# Patient Record
Sex: Female | Born: 1945
Health system: Southern US, Community
[De-identification: ages and names within clinical notes are randomized; demographics above are authoritative.]

## PROBLEM LIST (undated history)

## (undated) DIAGNOSIS — E785 Hyperlipidemia, unspecified: Secondary | ICD-10-CM

## (undated) DIAGNOSIS — M810 Age-related osteoporosis without current pathological fracture: Secondary | ICD-10-CM

## (undated) DIAGNOSIS — K5792 Diverticulitis of intestine, part unspecified, without perforation or abscess without bleeding: Secondary | ICD-10-CM

## (undated) DIAGNOSIS — Z9889 Other specified postprocedural states: Secondary | ICD-10-CM

## (undated) DIAGNOSIS — J45909 Unspecified asthma, uncomplicated: Secondary | ICD-10-CM

## (undated) DIAGNOSIS — H269 Unspecified cataract: Secondary | ICD-10-CM

## (undated) DIAGNOSIS — C801 Malignant (primary) neoplasm, unspecified: Secondary | ICD-10-CM

## (undated) DIAGNOSIS — K5790 Diverticulosis of intestine, part unspecified, without perforation or abscess without bleeding: Secondary | ICD-10-CM

## (undated) DIAGNOSIS — M5090 Cervical disc disorder, unspecified, unspecified cervical region: Secondary | ICD-10-CM

## (undated) DIAGNOSIS — K219 Gastro-esophageal reflux disease without esophagitis: Secondary | ICD-10-CM

## (undated) DIAGNOSIS — R112 Nausea with vomiting, unspecified: Secondary | ICD-10-CM

## (undated) DIAGNOSIS — I10 Essential (primary) hypertension: Secondary | ICD-10-CM

## (undated) DIAGNOSIS — Z8719 Personal history of other diseases of the digestive system: Secondary | ICD-10-CM

## (undated) DIAGNOSIS — T7840XA Allergy, unspecified, initial encounter: Secondary | ICD-10-CM

## (undated) DIAGNOSIS — M8580 Other specified disorders of bone density and structure, unspecified site: Secondary | ICD-10-CM

## (undated) DIAGNOSIS — M1990 Unspecified osteoarthritis, unspecified site: Secondary | ICD-10-CM

## (undated) HISTORY — DX: Unspecified cataract: H26.9

## (undated) HISTORY — DX: Nausea with vomiting, unspecified: R11.2

## (undated) HISTORY — DX: Diverticulosis of intestine, part unspecified, without perforation or abscess without bleeding: K57.90

## (undated) HISTORY — PX: KNEE ARTHROSCOPY: SUR90

## (undated) HISTORY — DX: Allergy, unspecified, initial encounter: T78.40XA

## (undated) HISTORY — PX: ESOPHAGEAL DILATION: SHX303

## (undated) HISTORY — PX: OTHER SURGICAL HISTORY: SHX169

## (undated) HISTORY — DX: Unspecified osteoarthritis, unspecified site: M19.90

## (undated) HISTORY — DX: Hyperlipidemia, unspecified: E78.5

## (undated) HISTORY — DX: Unspecified asthma, uncomplicated: J45.909

## (undated) HISTORY — PX: CHOLECYSTECTOMY: SHX55

## (undated) HISTORY — DX: Gastro-esophageal reflux disease without esophagitis: K21.9

## (undated) HISTORY — DX: Other specified postprocedural states: Z98.890

## (undated) HISTORY — DX: Cervical disc disorder, unspecified, unspecified cervical region: M50.90

## (undated) HISTORY — PX: JOINT REPLACEMENT: SHX530

## (undated) HISTORY — PX: UPPER GASTROINTESTINAL ENDOSCOPY: SHX188

## (undated) HISTORY — DX: Essential (primary) hypertension: I10

## (undated) HISTORY — PX: BLADDER SUSPENSION: SHX72

## (undated) HISTORY — DX: Personal history of other diseases of the digestive system: Z87.19

## (undated) HISTORY — PX: CERVICAL FUSION: SHX112

## (undated) HISTORY — DX: Other specified disorders of bone density and structure, unspecified site: M85.80

## (undated) HISTORY — PX: SPINE SURGERY: SHX786

## (undated) HISTORY — PX: FRACTURE SURGERY: SHX138

## (undated) HISTORY — DX: Diverticulitis of intestine, part unspecified, without perforation or abscess without bleeding: K57.92

## (undated) HISTORY — DX: Personal history of other diseases of the digestive system: Z98.890

## (undated) HISTORY — PX: APPENDECTOMY: SHX54

## (undated) HISTORY — DX: Malignant (primary) neoplasm, unspecified: C80.1

---

## 1993-12-16 HISTORY — PX: ABDOMINAL HYSTERECTOMY: SHX81

## 1999-11-27 ENCOUNTER — Other Ambulatory Visit: Admission: RE | Admit: 1999-11-27 | Discharge: 1999-11-27 | Payer: Self-pay | Admitting: Gynecology

## 2000-08-07 ENCOUNTER — Other Ambulatory Visit: Admission: RE | Admit: 2000-08-07 | Discharge: 2000-08-07 | Payer: Self-pay | Admitting: Gynecology

## 2001-01-16 ENCOUNTER — Encounter (INDEPENDENT_AMBULATORY_CARE_PROVIDER_SITE_OTHER): Payer: Self-pay | Admitting: Specialist

## 2001-01-16 ENCOUNTER — Observation Stay (HOSPITAL_COMMUNITY): Admission: RE | Admit: 2001-01-16 | Discharge: 2001-01-17 | Payer: Self-pay

## 2002-02-23 ENCOUNTER — Other Ambulatory Visit: Admission: RE | Admit: 2002-02-23 | Discharge: 2002-02-23 | Payer: Self-pay | Admitting: Gynecology

## 2004-02-15 ENCOUNTER — Encounter: Admission: RE | Admit: 2004-02-15 | Discharge: 2004-02-15 | Payer: Self-pay | Admitting: Obstetrics and Gynecology

## 2004-03-23 ENCOUNTER — Ambulatory Visit (HOSPITAL_BASED_OUTPATIENT_CLINIC_OR_DEPARTMENT_OTHER): Admission: RE | Admit: 2004-03-23 | Discharge: 2004-03-23 | Payer: Self-pay | Admitting: Orthopedic Surgery

## 2004-03-23 ENCOUNTER — Ambulatory Visit (HOSPITAL_COMMUNITY): Admission: RE | Admit: 2004-03-23 | Discharge: 2004-03-23 | Payer: Self-pay | Admitting: Orthopedic Surgery

## 2004-04-10 ENCOUNTER — Other Ambulatory Visit: Admission: RE | Admit: 2004-04-10 | Discharge: 2004-04-10 | Payer: Self-pay | Admitting: Obstetrics and Gynecology

## 2004-12-06 ENCOUNTER — Ambulatory Visit: Payer: Self-pay | Admitting: Family Medicine

## 2005-05-03 ENCOUNTER — Encounter: Admission: RE | Admit: 2005-05-03 | Discharge: 2005-05-03 | Payer: Self-pay | Admitting: Obstetrics and Gynecology

## 2005-05-22 ENCOUNTER — Ambulatory Visit: Payer: Self-pay | Admitting: Family Medicine

## 2005-09-04 ENCOUNTER — Ambulatory Visit: Payer: Self-pay | Admitting: Gastroenterology

## 2005-10-23 ENCOUNTER — Encounter: Admission: RE | Admit: 2005-10-23 | Discharge: 2005-10-23 | Payer: Self-pay | Admitting: Family Medicine

## 2005-12-16 HISTORY — PX: ABDOMINAL SACROCOLPOPEXY: SHX1114

## 2006-02-06 ENCOUNTER — Ambulatory Visit: Payer: Self-pay | Admitting: Gastroenterology

## 2006-02-13 ENCOUNTER — Ambulatory Visit: Payer: Self-pay | Admitting: Gastroenterology

## 2006-05-05 ENCOUNTER — Encounter: Admission: RE | Admit: 2006-05-05 | Discharge: 2006-05-05 | Payer: Self-pay | Admitting: Obstetrics and Gynecology

## 2006-09-10 ENCOUNTER — Ambulatory Visit: Payer: Self-pay | Admitting: Gastroenterology

## 2006-10-23 ENCOUNTER — Ambulatory Visit: Payer: Self-pay | Admitting: Gynecology

## 2006-10-23 ENCOUNTER — Inpatient Hospital Stay (HOSPITAL_COMMUNITY): Admission: RE | Admit: 2006-10-23 | Discharge: 2006-10-26 | Payer: Self-pay | Admitting: Obstetrics and Gynecology

## 2007-05-07 ENCOUNTER — Encounter: Admission: RE | Admit: 2007-05-07 | Discharge: 2007-05-07 | Payer: Self-pay | Admitting: Obstetrics and Gynecology

## 2007-07-28 ENCOUNTER — Encounter: Admission: RE | Admit: 2007-07-28 | Discharge: 2007-07-28 | Payer: Self-pay | Admitting: Obstetrics and Gynecology

## 2008-03-09 ENCOUNTER — Ambulatory Visit: Payer: Self-pay | Admitting: Cardiology

## 2008-03-15 ENCOUNTER — Ambulatory Visit: Payer: Self-pay

## 2008-05-10 ENCOUNTER — Encounter: Admission: RE | Admit: 2008-05-10 | Discharge: 2008-05-10 | Payer: Self-pay | Admitting: Obstetrics and Gynecology

## 2009-01-30 ENCOUNTER — Ambulatory Visit (HOSPITAL_COMMUNITY): Admission: EM | Admit: 2009-01-30 | Discharge: 2009-02-01 | Payer: Self-pay | Admitting: Emergency Medicine

## 2009-01-30 ENCOUNTER — Encounter (INDEPENDENT_AMBULATORY_CARE_PROVIDER_SITE_OTHER): Payer: Self-pay | Admitting: Surgery

## 2009-01-30 ENCOUNTER — Encounter: Admission: RE | Admit: 2009-01-30 | Discharge: 2009-01-30 | Payer: Self-pay | Admitting: Family Medicine

## 2009-05-26 ENCOUNTER — Encounter: Admission: RE | Admit: 2009-05-26 | Discharge: 2009-05-26 | Payer: Self-pay | Admitting: Obstetrics and Gynecology

## 2009-12-16 HISTORY — PX: OTHER SURGICAL HISTORY: SHX169

## 2009-12-16 HISTORY — PX: RECTOCELE REPAIR: SHX761

## 2010-03-13 ENCOUNTER — Ambulatory Visit (HOSPITAL_COMMUNITY): Admission: RE | Admit: 2010-03-13 | Discharge: 2010-03-14 | Payer: Self-pay | Admitting: Obstetrics and Gynecology

## 2010-06-04 ENCOUNTER — Encounter: Admission: RE | Admit: 2010-06-04 | Discharge: 2010-06-04 | Payer: Self-pay | Admitting: Obstetrics and Gynecology

## 2011-03-10 LAB — COMPREHENSIVE METABOLIC PANEL
ALT: 23 U/L (ref 0–35)
AST: 22 U/L (ref 0–37)
Albumin: 3.9 g/dL (ref 3.5–5.2)
Alkaline Phosphatase: 56 U/L (ref 39–117)
BUN: 12 mg/dL (ref 6–23)
CO2: 28 mEq/L (ref 19–32)
Calcium: 9.4 mg/dL (ref 8.4–10.5)
Chloride: 105 mEq/L (ref 96–112)
Creatinine, Ser: 0.98 mg/dL (ref 0.4–1.2)
GFR calc Af Amer: >60 mL/min (ref >60)
GFR calc non Af Amer: 57 mL/min — ABNORMAL LOW (ref >60)
Glucose, Bld: 86 mg/dL (ref 70–99)
Potassium: 3.9 mEq/L (ref 3.5–5.1)
Sodium: 143 mEq/L (ref 135–145)
Total Bilirubin: 0.5 mg/dL (ref 0.3–1.2)
Total Protein: 6.6 g/dL (ref 6.0–8.3)

## 2011-03-10 LAB — CBC
HCT: 30.5 % — ABNORMAL LOW (ref 36.0–46.0)
HCT: 37.1 % (ref 36.0–46.0)
Hemoglobin: 10.7 g/dL — ABNORMAL LOW (ref 12.0–15.0)
Hemoglobin: 12.8 g/dL (ref 12.0–15.0)
MCHC: 34.5 g/dL (ref 30.0–36.0)
MCHC: 35.2 g/dL (ref 30.0–36.0)
MCV: 90 fL (ref 78.0–100.0)
MCV: 90.6 fL (ref 78.0–100.0)
Platelets: 142 10*3/uL — ABNORMAL LOW (ref 150–400)
Platelets: 184 10*3/uL (ref 150–400)
RBC: 3.37 MIL/uL — ABNORMAL LOW (ref 3.87–5.11)
RBC: 4.12 MIL/uL (ref 3.87–5.11)
RDW: 12.9 % (ref 11.5–15.5)
RDW: 13.1 % (ref 11.5–15.5)
WBC: 4.1 10*3/uL (ref 4.0–10.5)
WBC: 6.6 10*3/uL (ref 4.0–10.5)

## 2011-03-10 LAB — URINALYSIS, ROUTINE W REFLEX MICROSCOPIC
Bilirubin Urine: NEGATIVE
Glucose, UA: NEGATIVE mg/dL
Hgb urine dipstick: NEGATIVE
Ketones, ur: NEGATIVE mg/dL
Nitrite: NEGATIVE
Protein, ur: NEGATIVE mg/dL
Specific Gravity, Urine: <1.005 — ABNORMAL LOW (ref 1.005–1.030)
Urobilinogen, UA: 0.2 mg/dL (ref 0.0–1.0)
pH: 6 (ref 5.0–8.0)

## 2011-03-10 LAB — PROTIME-INR
INR: 0.96 (ref 0.00–1.49)
Prothrombin Time: 12.7 seconds (ref 11.6–15.2)

## 2011-03-10 LAB — APTT: aPTT: 27 seconds (ref 24–37)

## 2011-04-02 LAB — DIFFERENTIAL
Basophils Absolute: 0 10*3/uL (ref 0.0–0.1)
Basophils Relative: 0 % (ref 0–1)
Eosinophils Absolute: 0 10*3/uL (ref 0.0–0.7)
Eosinophils Relative: 0 % (ref 0–5)
Lymphocytes Relative: 5 % — ABNORMAL LOW (ref 12–46)
Lymphs Abs: 0.4 10*3/uL — ABNORMAL LOW (ref 0.7–4.0)
Monocytes Absolute: 0.1 10*3/uL (ref 0.1–1.0)
Monocytes Relative: 1 % — ABNORMAL LOW (ref 3–12)
Neutro Abs: 6.1 10*3/uL (ref 1.7–7.7)
Neutrophils Relative %: 94 % — ABNORMAL HIGH (ref 43–77)

## 2011-04-02 LAB — CBC
HCT: 35.9 % — ABNORMAL LOW (ref 36.0–46.0)
Hemoglobin: 12.1 g/dL (ref 12.0–15.0)
MCHC: 33.7 g/dL (ref 30.0–36.0)
MCV: 88 fL (ref 78.0–100.0)
Platelets: 178 10*3/uL (ref 150–400)
RBC: 4.07 MIL/uL (ref 3.87–5.11)
RDW: 13.5 % (ref 11.5–15.5)
WBC: 6.6 10*3/uL (ref 4.0–10.5)

## 2011-04-30 NOTE — Assessment & Plan Note (Signed)
[facility]                            CARDIOLOGY OFFICE NOTE   Sarah [name], Sarah [name]                       MRN:          [id number]  DATE:03/09/2008                            DOB:          [birth year]    The patient is a very pleasant 65 year old female who I am asked to  evaluate for chest pain.  She has no prior cardiac history.  She  typically does not have dyspnea on exertion, orthopnea, PND, pedal  edema, palpitations, presyncope, syncope, chest pain.  Approximately 1  month ago she awoke with a tightness diffusely across her chest that  radiates to her neck.  There is no associated nausea, vomiting,  shortness of breath or diaphoresis.  Pain was not pleuritic positional  nor is related to food.  It lasted for the 20 minutes 1 hour resolve  spontaneously.  Of note she states he fell as she may have been had an  anxiety attack.  She had second episode approximately 1 week ago.  That  time she was watching TV and was described similarly.  Again there was  no nausea, vomiting, shortness breath or diaphoresis.  It lasted for the  same amount of time.  She also states that she has been under  significant amount of stress at work recently.  Because of the above we  were asked to further evaluate.  She has had no symptoms in the past 1  week.  Her medications include Zocor 40 mg daily, Diovan 80 mg daily,  Nexium 40 mg daily, aspirin 81 mg daily, and estrogen patch.  She takes  Zyrtec p.r.n.  She has no known drug allergies.   SOCIAL HISTORY:  She does not smoke.  She consumes one glass of wine per  evening.   FAMILY HISTORY:  Strongly positive coronary artery disease.  Father had  myocardial infarctions in his 7s and sister has had a myocardial  infarction as well.   PAST MEDICAL HISTORY:  Significant for hypertension, hyperlipidemia.  There is no diabetes mellitus.  She does have a history of asthma.  She  also has gastroesophageal disease.  She  has had prior surgery for  cervical disk problem.  She has had previous left shoulder arthroscopic  surgery.  She has had cholecystectomy, hysterectomy, and reconstruction  of her pelvic structures.  She is also had three arthroscopic knee  surgeries.   REVIEW OF SYSTEMS:  She denies any headaches or fevers, chills.  No  productive cough or hemoptysis.  There is no dysphagia.  The patient  melena or hematochezia.  There is no dysuria inch of his or seizure  activity.  There is no orthopnea, PND, pedal edema.  There is no  claudication noted.  Remaining systems are negative.   PHYSICAL EXAMINATION:  She is well-developed, well-nourished in no acute  distress.  Skin is warm, dry.  The patient is not depressed.  There is no peripheral clubbing.  Her back is normal.  Blood pressure is 145/79 and pulse 56.  She weighs 167 pounds.  Her HEENT is normal with normal eyelids.  Neck is supple.  Normal upstroke bilaterally.  No bruits noted.  There  is no jugular distention and no thyromegaly is noted.  Chest is clear.  Normal expansion.  CARDIOVASCULAR:  Regular rhythm.  Normal S1-S2.  There are no murmurs,  rubs or gallops noted.  There is no change with Valsalva.  ABDOMEN:  Nontender.  Positive bowel sounds hepatosplenomegaly no mass  appreciated.  There is no abdominal bruit.  She has 2+ femoral pulses bilaterally bruits.  Extremities show no edema  palpate no cords.  She is 2+ posterior tibial pulses bilaterally.  NEUROLOGIC:  Grossly intact.   Her electrocardiogram shows a sinus rhythm at a rate of 56.  The axis is  normal.  No ST changes noted.   DIAGNOSES:  1. Chest pain - Mrs. Tovey's symptoms are atypical.  She does state      that she had been under significant amount stress.      Electrocardiogram is normal.  However, she has hypertension,      hyperlipidemia, strong family history of coronary disease.  We will      risk stratify with a Myoview.  Shows no ischemia,  we will  not      pursue further ischemia evaluation unless her symptoms recur.  We      will see back on as-needed basis pending those results.  2. Hypertension - she will continue on her Diovan and this is being      managed by Dr. Herb Grays.  3. Hyperlipidemia - she will continue on Zocor.  4. History of gastric reflux disease - she will continue on Nexium.     Madolyn Frieze Jens Som, MD, [facility]  Electronically Signed    BSC/MedQ  DD: 03/09/2008  DT: 03/09/2008  Job #: [id number]   cc:   Tammy R. Collins [place], M.D.

## 2011-04-30 NOTE — Op Note (Signed)
NAMEMarland Kitchen  Sarah, [name]                ACCOUNT NO.:  [id number]   MEDICAL RECORD NO.:  [id number]          PATIENT TYPE:  INP   LOCATION:  0111                         FACILITY:  [facility]   PHYSICIAN:  Thornton Park. Daphine Deutscher, MD  DATE OF BIRTH:  Feb 27, 1946   DATE OF PROCEDURE:  01/30/2009  DATE OF DISCHARGE:                               OPERATIVE REPORT   PREOPERATIVE DIAGNOSIS:  Acute appendicitis.   POSTOPERATIVE DIAGNOSIS:  Acute pelvic appendicitis with appendix stuck  to the pelvic mesh.   PROCEDURE:  Laparoscopic appendectomy.   SURGEON:  Luretha Murphy, M.D.   ANESTHESIA:  General endotracheal.   DRAINS:  One 45 Blake into the pelvis.   DESCRIPTION OF PROCEDURE:  Sarah [name] is a 65 year old lady whom I saw  in the emergency room having been referred there by Dr. Collins [name] with  appendicitis.  I reviewed her CT scan and posted her.  Her history was  remarkable in that she has had previous pelvic floor reconstruction with  mesh.   I elected to go in through the left upper quadrant with 10 mm 10/11  OptiView 0 degrees scope without difficulty.  The abdomen was  insufflated.  A 5 was placed the right upper quadrant.  She had a  hormone patch that had been prepped in the field and was left in place.  The oblique placement of the 11/12 in the left lower quadrant was where  we ran the camera initially but where I needed to fire the stapler.  I  took down adhesions with the Harmonic through the umbilicus as she had  many from her previous pelvic surgery.  These were taken down and I  followed the cecum down and found this hard woody mass diving straight  down into the pelvis.  There was yellow-green fluid which I aspirated  with the suction apparatus and cleared that.  I then did a dissection  mainly with the suction tip and a Prestige grasper to then followed down  to the tip.  When I discerned the tip, it was fused in the pelvis.  I  began teasing this away and realized that what I was  seeing was where it  was stuck to mesh.  I teased this away from the mesh and was able to get  this out.  With some tedious dissection we got it out nevertheless and  again blunt dissection.  I did not appear to damage any structures and  once the tip was mobilized, I then began looking at the mesentery.  This  was very thick and I realized that with the mesentery which was stuck to  the tube structure on the right and there were some fimbriated ends that  helped me identify the structure and I teased it away from the  mesentery.  Again I was using the 30 degree scope at this point and went  in through the lower portion of the abdomen and dissected out the base  of the appendix and was able to insert the stapler from that end and  fired this and this gave me a good  clean take on the appendiceal stump.  The appendiceal stump appeared secure.  I then resected the appendix  mesentery with Harmonic scalpel.  There was no bleeding when I completed  this.  I placed it into a bag and then brought it out without difficulty  through the left lower quadrant.  She did have some trouble with the  trocars that were going in and out and I did place them obliquely.  After there was a little oozing from the left lower quadrant trocar and  I went and cauterized over the harmonic scalpel.  This controlled this  completely and this was just a minor ooze and did not appear to be any  arterial bleeding.  I then placed a drain in and brought it out through  the 5 mm in the right upper quadrant.  I sutured it with a 4-0 nylon.  This was placed down into the pelvis on the right side and laid in next  to where the appendiceal bed had been.  I irrigated with copious fluid  using the Nezhat  suction irrigator device.  I then returned her to a normal position,  deflated the abdomen, closed skin with 4-0 Vicryl after injecting with  some Marcaine.  The patient seemed to tolerate the procedure well and  was taken to  recovery room in satisfactory condition.  She will be  maintained on Unasyn which I started preoperatively.      Thornton Park Daphine Deutscher, MD  Electronically Signed     MBM/MEDQ  D:  01/30/2009  T:  01/31/2009  Job:  [id number]   cc:   Tammy R. Sarah [name], M.D.  Fax: [phone]   Randye Lobo, M.D.  Fax: [phone]

## 2011-05-03 NOTE — Discharge Summary (Signed)
NAMEMarland Kitchen  Sarah [name], Sarah [name] NO.:  [id number]   MEDICAL RECORD NO.:  [id number]          PATIENT TYPE:  INP   LOCATION:  9319                          FACILITY:  [facility]   PHYSICIAN:  Randye Lobo, M.D.   DATE OF BIRTH:  1946/05/31   DATE OF ADMISSION:  10/23/2006  DATE OF DISCHARGE:  10/26/2006                               DISCHARGE SUMMARY   ADMISSION DIAGNOSIS:  Complete vaginal vault prolapse.   DISCHARGE DIAGNOSES:  1. Complete vaginal vault prolapse.  2. Status post abdominosacrocolpopexy.  3. Abdominal Burch procedure, Halban's culdoplasty, Cystoscopy.  4. Postoperative fever attributed to atelectasis.   SIGNIFICANT OPERATIONS AND PROCEDURES:  The patient underwent an  abdominosacrocolpopexy, Halban's culdoplasty, abdominal Burch procedure,  and cystoscopy on October 23, 2006, at [facility] of  [place] under the direction of Dr. Conley Simmonds and with the  assistance of Dr. Alfonzo Feller.   ADMISSION HISTORY AND PHYSICAL EXAMINATION:  GENERAL:  The patient is a  65 year old gravida 1, para 1, Caucasian female, status post total  vaginal hysterectomy with anterior and posterior colporrhaphy in 1995,  who presented with vaginal prolapse and leakage of urine with stressful  maneuvers.  The patient desired surgical treatment of the above.  On  physical examination the patient was noted to have complete vaginal  vault eversion.  The cervix and uterus were absent, and there were no  pelvic masses appreciated.   The patient was admitted on October 23, 2006, at which time she  underwent an abdominosacrocolpopexy, Halban's culdoplasty, abdominal  Burch procedure and cystoscopy under the direction of Dr. Conley Simmonds  and with the assistance of Dr. Alfonzo Feller.  The patient's surgery  was performed under general anesthesia, and her estimated blood loss was  150 mL.  There were no complications to the surgery.   Following surgery, the patient had  decreased urine output with stable  vital signs.  The patient was given IV fluid boluses to treat her  dehydration.  The patient's urine output then responded well.   By the afternoon of postoperative day 1, the patient did begin with some  bladder training and had large postvoid residuals, and the Foley  catheter was therefore replaced.   The patient began developing fevers approximately 30 hours postop.  Her  temperature reached 101.4.  On examination, the patient was noted to  have atelectasis and was using her incentive spirometry poorly.  A  urinalysis was also checked, and this did not demonstrate any infection.  The patient, at this time, was on Ancef prophylaxis which was chosen  secondary to use of the synthetic graft for the sacral colpopexy.  A CBC  on postoperative day #1 did demonstrate a white blood cell count of 6.0.  By postoperative day #2, the patient's white count was noted to be 5.3  with 70% neutrophils.  The patient did continue to spike her fevers and,  again, although the diagnosis was most consistent with atelectasis,  there was also potential concern for infection with the patient's  synthetic graft for the sacral colpopexy.  The Ancef was  therefore  discontinue, and ampicillin and gentamicin was begun.  By postoperative  day #3, the patient had become afebrile.  Her respiratory status  improved, and she was noted to be in good condition.   With respect to the patient's remainder of her hospitalization, she was  able to ambulate independently and well.  She was advanced to a regular  diet which she was tolerating prior to her discharge.  The patient did  have both TED hose and PAS stockings for DVT prophylaxis during her  hospitalization.  The patient's Foley catheter was simply left to  gravity drainage due to the large residuals that she had.  Her abdominal  incision remained clean, dry, and intact, and the staples were removed  prior to her discharge, and  Steri-Strips and Benzoin were placed.   The patient was found to be in good condition, ready for discharge on  postoperative day #3.  Her white blood cell count on the day of  discharge was 4.5 with 65% neutrophils, and her hemoglobin was 10.0.   INSTRUCTIONS AT DISCHARGE:  1. Discharge to home.  2. Medications at discharge.  The patient will take Percocet 1-2 p.o.      q.4-6h. p.r.n. pain; ibuprofen 600 mg p.o. q.6h. p.r.n. pain.  3. The patient will follow a regular diet.  4. The patient will have decreased activity.  She will not drive for 2      weeks.  She will not lift anything over 10 pounds for 12 weeks.  5. The patient will follow up in 5 days in the office for an incision      check and a voiding trial.  6. The patient will call if she experiences any problems with      recurrent fevers, nausea and vomiting, pain uncontrolled by her      medication, vaginal bleeding, difficulty with her catheter,      respiratory problems, or any other concern.      Randye Lobo, M.D.  Electronically Signed     BES/MEDQ  D:  11/16/2006  T:  11/17/2006  Job:  [id number]

## 2011-05-03 NOTE — Op Note (Signed)
NAME:  Sarah [name], SURGES                          ACCOUNT NO.:  [id number]   MEDICAL RECORD NO.:  [id number]                   PATIENT TYPE:  AMB   LOCATION:  DSC                                  FACILITY:  [facility]   PHYSICIAN:  Katy Fitch. Naaman Plummer., M.D.          DATE OF BIRTH:  [birth year]   DATE OF PROCEDURE:  03/23/2004  DATE OF DISCHARGE:                                 OPERATIVE REPORT   PREOPERATIVE DIAGNOSIS:  Chronic mucous cyst right long finger with nail  plate irregularity status post prior wedge biopsy of right long finger  nail fold.   POSTOPERATIVE DIAGNOSIS:  Chronic mucous cyst right long finger with nail  plate irregularity status post prior wedge biopsy of right long finger  nail fold.   OPERATION:  1. Debridement of mucous cyst right long finger from dorsal nail fold.  2. DIP joint irrigation and debridement with removal of marginal     osteophytes.   SURGEON:  Katy Fitch. Sypher, M.D.   ASSISTANT:  Jonni Sanger, P.A.   ANESTHESIA:  0.25% Marcaine and 2% lidocaine metacarpal head level block  supplemented by IV sedation.   ANESTHESIOLOGIST:  Judie Petit, M.D.   INDICATIONS FOR PROCEDURE:  Sarah [name] is a 65 year old woman referred by  Dr. Marcy Panning for evaluation and management of a right long finger mucous  cyst predicament.  [name] has seen Dr. Orson Slick for evaluation of a mass  along her nail fold and underwent a wedge biopsy for diagnosis by Dr.  Orson Slick.  This lead to the diagnosis of a mucous cyst.  Unfortunately, there  was a significant residual nail deformity due, in part, to the cyst and due  to a loss of the dorsal nail fold.  She, subsequently, was referred for an  upper extremity orthopedic consult.  She was noted to have a 6 mm wide  irregular band of her central nail plate.  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  She understands that it is likely that she will have permanent  nail deformity due to a portion of her dorsal nail fold being resected.   PROCEDURE:  [name] is brought to the operating room and placed on  supine position on the operating table.  Following informed consent, 0.25%  Marcaine and 2% lidocaine were infiltrated at metacarpal head levels to  obtain a digital block.  Light sedation was administered.  When anesthesia  was satisfactory, the finger was exsanguinated with a gauze wrap and a 1/2  inch Penrose drain was placed at metacarpal head level as a digital  tourniquet.  The procedure commenced with a curvilinear incision exposing  the extensor tendon.  A capsulectomy was performed  on the dorsal radial and  dorsal ulnar aspect of the DIP joint with resection of the capsule between  the terminal extensor tendon and the radial and ulnar collateral ligaments.  A small curet was used to debride the marginal osteophytes followed by  irrigation of the joint with sterile saline utilizing a 20 gauge Angiocath  catheter and a 20 mL syringe.  The joint was irrigated with approximately 80  mL of saline.  The cyst along the nail fold was then curetted with a fine  curet deep to the dorsal nail fold and in the subcutaneous region of the  dorsal nail fold in an effort to disrupt any sinus tract that had formed  from the DIP joint.   The wounds were then irrigated followed by repair of the skin with  interrupted sutures of 5-0 nylon.  There were no apparent complications.  Ms. Kang tolerated the surgery and anesthesia well.  She was transferred  to the recovery room with stable vital signs.  She will be discharged with  prescriptions for Vicodin 5 mg 1-2 tablets p.o. q.4-6h. p.r.n. pain, 20  tablets without refill, Keflex 500 mg 1 p.o. q.8h. x 4 days as a  prophylactic  antibiotic.                                               Katy Fitch Naaman Plummer., M.D.    RVS/MEDQ  D:  03/23/2004  T:  03/23/2004  Job:  [id number]   cc:   Lorre Munroe., M.D.  Fax: [phone]

## 2011-05-03 NOTE — Op Note (Signed)
NAMEMarland Kitchen  MADOLYN, Sarah [name] NO.:  [id number]   MEDICAL RECORD NO.:  [id number]          PATIENT TYPE:  OIB   LOCATION:  9399                          FACILITY:  [facility]   PHYSICIAN:  Randye Lobo, M.D.   DATE OF BIRTH:  July 14, 1946   DATE OF PROCEDURE:  10/23/2006  DATE OF DISCHARGE:                               OPERATIVE REPORT   PREOPERATIVE DIAGNOSIS:  Complete vaginal prolapse.   POSTOPERATIVE DIAGNOSIS:  Complete vaginal prolapse.   PROCEDURE:  Abdominal sacrocolpopexy, Halban's culdoplasty, abdominal  Burch procedure and cystoscopy.   SURGEON:  Conley Simmonds, M.D.   ASSISTANT:  Blima Rich, M.D.   ANESTHESIA:  General endotracheal.   IV FLUIDS:  2300 mL Ringer's lactate.   ESTIMATED BLOOD LOSS:  150 mL.   URINE OUTPUT:  200 mL.   COMPLICATIONS:  None.   INDICATIONS FOR PROCEDURE:  The patient is a 65 year old gravida 1, para  1 Caucasian female, status post total vaginal hysterectomy with anterior  and posterior colporrhaphy in 1995 who presented with vaginal prolapse  and leakage of urine with stressful maneuvers.  The patient desires  surgical treatment.  Urodynamic testing with a vaginal packing did not  demonstrate genuine stress incontinence.  The patient was noted to have  a high maximum detrusor pressure with voiding on her pressure flow study  of 97 cm of water.  On examination, the patient was noted to have a  large and complete vaginal vault eversion.  The patient desired a  procedure to maximize vaginal length.  A plan was made to proceed with  an abdominal sacral colpopexy with Halban's culdoplasty, abdominal Burch  procedure and cystoscopy and possible anterior and posterior  colporrhaphy after risks, benefits, and alternatives were discussed.   FINDINGS:  Examination under anesthesia revealed complete vaginal vault  eversion.  The vaginal vault eversion was noted to be quite large,  approximately the size of a grapefruit.  The cervix  and uterus were  noted to be absent.   Cystoscopy demonstrated the bladder to be normal throughout 360 degrees  including the bladder dome and trigone.  The ureters were noted be  patent bilaterally. There was no evidence of any suture material in the  urethra or the bladder after performing the abdominal Burch procedure.  The ureters were noted to be patent bilaterally.   SPECIMENS:  None.   PROCEDURE:  The patient was re-identified in the preoperative hold area.  She did receive Ancef 1 gram IV for antibiotic prophylaxis.  The patient  received both TED hose and PAS stockings for DVT prophylaxis.   In the operating room, general endotracheal anesthesia was induced.  The  patient was then placed in the dorsal lithotomy position.  The abdomen  and vagina and vulva were then sterilely prepped and draped, and a Foley  catheter was placed inside the bladder with 15 mL of fluid in the Foley  bulb.   The procedure began by creating a Pfannenstiel incision sharply with a  scalpel.  Hemostasis was created in the subcutaneous layer with  monopolar cautery.  The fascia was  then incised in the midline, and the  incision was extended bilaterally with a Mayo scissors.  The rectus  muscles were separated from the underlying fascia using sharp dissection  superiorly and inferiorly.  The parietal peritoneum was then elevated  with two hemostat clamps and was entered sharply.  The peritoneal  incision was extended cranially and caudally.   The procedure began with lysis of adhesions of omentum to the vaginal  cuff and the left pelvic sidewall.  This was performed with monopolar  cautery and with sharp dissection with a Metzenbaum scissors.  This  allowed access into the pelvis.   A Balfour retractor was then placed inside the abdomen, and the bowel  was packed into the upper abdomen in order to expose both the vaginal  cuff and the presacral region.   The vagina was elevated at this time, and  the vaginal apices were marked  with 2 Allis clamps.  The peritoneum overlying the vagina was incised  transversely using sharp dissection.  The dissection was continued  anteriorly down toward the bladder and posteriorly down toward the  rectum in order to clear the peritoneum such that the colpopexy sutures  could be placed vaginally without difficulty.   The Halban's culdoplasty was performed next with a series of five 2-0  Ethibond sutures.  Care was taken to identify each of the ureters prior  to performance of the Halban's culdoplasty.  Two culdoplasty sutures  were placed to the right of the midline in the cul-de-sac, and two were  placed to the left of the midline in the cul-de-sac.  One additional  suture was placed directly in the midline.  The culdoplasty was  performed in standard fashion, and the pursestring sutures were tied  sequentially to obliterate the posterior cul-de-sac.   The dissection over the sacral promontory was performed next.  Landmarks  were identified, and the retroperitoneal space was entered overlying the  sacral promontory using sharp dissection along the peritoneum.  The  middle sacral vessels were identified at this time, and a Kitner was  used to remove fat from overlying the anterior longitudinal ligaments.  The ureter and iliac vessels were identified on the right-hand side, and  the iliac vessels were identified on the patient's left-hand side.  Two  sutures were then placed through the periosteum of the sacrum to the  right and to the mid left of the midline.  The sutures were 0 Ethibond.   The Prolene mesh was then measured and was folded in a double thickness.  The width was approximately 2.5 cm, and the length was approximately 10  cm.  A series of 0 Ethibond sutures were then placed in pairs through  the Prolene mesh along the posterior aspect of the vagina, and the mesh was attached to this.  Each of the sutures were tied sequentially.  A   series of 5 pairs of sutures were placed posteriorly, and 3 pairs of  sutures were later placed anteriorly to secure the mesh to the anterior  vaginal wall.  The mesh was attached to the sacrum using the previously  placed 0 Ethibond sutures.  The mesh was then peritonealized using a  running suture of 2-0 Vicryl.  The majority of the mesh was completely  covered except for at the very base which was overlying the Halban's  culdoplasty.  Hemostasis was noted to be good at this time.   Attention was then turned to the retropubic space, and the Burch  procedure was performed.  Dissection in the retropubic space was  performed bluntly to expose the arcus tendineus fascia pelvis  bilaterally.  A series of 4 Burch sutures were placed with the medial  sutures placed at the level of the mid urethra and lateral to it and the  lateral sutures placed just lateral and distal to these.  Each of the  Burch sutures were figure-of-eight sutures of 0 Ethibond at the level of  the vaginal sutures.  The sutures that were placed on the patient's left-  hand side were each tied down as there were bleeding veins in this  region.  Two figure-of-eight sutures of 2-0 Vicryl were also used to  create good hemostasis for a torn vein overlying the bladder.  This  provided good control of the bleeding.  Each of the Burch sutures were  then brought up through the Cooper's ligaments, and the knots were then  tied on top of the Cooper's ligaments while a vaginal hand elevated the  vagina from below.  This provided good elevation and support of the  urethra and provided good reduction of the cystocele.   Cystoscopy was performed after indigo carmine dye was injected IV.  The  findings were as noted above.  A 16-French Foley catheter was placed to  gravity drainage after the cystoscopy was completed.   Attention was then turned to the abdomen for closure.  The self-  retaining retractor and the abdominal lap pads were  removed.  The  parietal peritoneum was closed with a running suture of 2-0 Vicryl.  The  rectus muscles were reapproximated in the midline with interrupted  sutures of 0 Vicryl.  The fascia was closed with a running suture of 0  Vicryl.  Copious irrigation was used at all points of the abdominal  closure including the peritoneal cavity, the rectus muscles, and the  subcutaneous tissue.  After closure of the fascia with running locked  sutures of 0 Vicryl, the subcutaneous layer was closed with interrupted  sutures of 3-0 plain.  The skin was then closed with staples.  A sterile  bandage was placed over this.   This concluded the patient's procedure.  There were no complications.  All needle, instrument, and sponge counts were correct.      Randye Lobo, M.D.  Electronically Signed     BES/MEDQ  D:  10/23/2006  T:  10/24/2006  Job:  [id number]

## 2011-05-03 NOTE — Assessment & Plan Note (Signed)
[facility]                           GASTROENTEROLOGY OFFICE NOTE   SIGNORA, ZUCCO                       MRN:          [id number]  DATE:09/10/2006                            DOB:          1946-01-09    Shailah comes in on September 26, says she has had no problems.  She just  wanted some refills.  Her medications presently include Climara, aspirin,  Nexium, Diovan, Zyrtec, Lipitor, and Zocor.  Anavey has known GERD. IBS  probably related to her very long tortuous colon.  She is status post  cholecystectomy.  She has hyperlipidemia.   RECOMMENDATIONS:  Refill her Nexium 40 mg daily.  I told her to let us know  if she has any difficulty.  She does have checkups by her primary care  doctor and I told her to just let us know if she has any further problems.  She needs a blood test, as well.            ______________________________  Ulyess Mort, MD      SML/MedQ  DD:  09/11/2006  DT:  09/13/2006  Job #:  [id number]

## 2011-05-03 NOTE — Op Note (Signed)
[facility]  Patient:    Sarah [name], Sarah [name]                       MRN: [id number] Proc. Date: 01/16/01 Adm. Date:  [id number] Disc. Date: [id number] Attending:  Meredith Leeds CC:         Ulyess Mort, M.D. [facility]   Operative Report  PREOPERATIVE DIAGNOSIS:  Symptomatic gallstones.  POSTOPERATIVE DIAGNOSIS:  Symptomatic gallstones.  OPERATION:  Laparoscopic cholecystectomy.  SURGEON:  Zigmund Daniel, M.D.  ASSISTANT:  Chevis Pretty, M.D.  ANESTHESIA:  General.  DESCRIPTION OF PROCEDURE:  After adequate general anesthesia, and routine preparation and draping of the abdomen, I made a short transverse infraumbilical incision at the site of the previous laparoscopy.  I carefully dissected through the scar tissue and opened the fascia in the midline, and then bluntly opened the peritoneum, and found that there were no adhesions to the wound in the immediate area.  I then placed an 0 Vicryl pursestring in the fascia and secured the Hasson cannula, and inflated the abdomen with CO2, and did laparoscopy.  The gallbladder was not acutely inflamed, although there were some adhesions of the omentum to it.  The remainder of the peritoneoscopy was unremarkable.  After placement of three additional ports and positioning of the patients head up, down, and tilted to the left, I elevated the fundus of the gallbladder and retracted the infundibulum to the right, and bluntly took down the adhesions.  There was a small area where the liver capsule stripped off and I cauterized that and got good hemostasis.  I then carefully dissected out the cystic duct and cystic artery, seeing the infundibulum of the gallbladder margin to the cystic duct.  I saw the junction of the cystic duct and the common bile duct.  I clipped the cystic duct with four clips and cut between the two closest to the gallbladder.  I clipped the cystic artery with three clips, and cut between  the two closest to the gallbladder.  I found a couple of other small vessels which were easily controlled by cautery.  The clips were _______, and there was no leakage of bile.  We then removed the gallbladder from the liver using the hook cautery device. There was one small hole made in the gallbladder where it tore due to retraction.  I revealed the bile which spilled out and copiously irrigated the right upper quadrant and removed the small gallstones and the bile which accumulated.  I then placed the gallbladder in a plastic pouch after detaching it from the liver, and I removed it through the umbilical incision, and tied the pursestring suture.  I again irrigated the right upper quadrant and removed the irrigant until there was no bilious color remaining.  Hemostasis was good and the clips appeared to be in secure position.  I then anesthetized all the incisions and removed the CO2 and the ports.  I closed the skin with intracuticular 4-0 Vicryl and Steri-Strips.  The patient was stable through the operation. DD:  01/16/01 TD:  01/18/01 Job: [id number]

## 2011-06-03 ENCOUNTER — Other Ambulatory Visit: Payer: Self-pay | Admitting: Obstetrics and Gynecology

## 2011-07-22 ENCOUNTER — Other Ambulatory Visit: Payer: Self-pay | Admitting: Obstetrics and Gynecology

## 2013-08-02 ENCOUNTER — Encounter: Payer: Self-pay | Admitting: Obstetrics and Gynecology

## 2013-08-02 ENCOUNTER — Ambulatory Visit (INDEPENDENT_AMBULATORY_CARE_PROVIDER_SITE_OTHER): Payer: Medicare Other | Admitting: Obstetrics and Gynecology

## 2013-08-02 VITALS — BP 136/70 | HR 64 | Ht 63.75 in | Wt 145.5 lb

## 2013-08-02 DIAGNOSIS — Z0000 Encounter for general adult medical examination without abnormal findings: Secondary | ICD-10-CM

## 2013-08-02 DIAGNOSIS — Z01419 Encounter for gynecological examination (general) (routine) without abnormal findings: Secondary | ICD-10-CM

## 2013-08-02 DIAGNOSIS — M8580 Other specified disorders of bone density and structure, unspecified site: Secondary | ICD-10-CM

## 2013-08-02 LAB — POCT URINALYSIS DIPSTICK
Blood, UA: NEGATIVE
Protein, UA: NEGATIVE
Urobilinogen, UA: NEGATIVE
pH, UA: 5

## 2013-08-02 MED ORDER — ESTROGENS, CONJUGATED 0.625 MG/GM VA CREA: TOPICAL_CREAM | Freq: Every day | VAGINAL | Status: DC

## 2013-08-02 MED ORDER — CLOBETASOL PROPIONATE 0.05 % EX OINT: 1.0000 "application " | TOPICAL_OINTMENT | CUTANEOUS | Status: DC

## 2013-08-02 NOTE — Progress Notes (Signed)
Patient ID: Sarah [name], female   DOB: 08-10-1946, 67 y.o.   MRN: [id number] 67 y.o.   Married    Caucasian   female   [id number]   here for annual exam.    History of lichen sclerosis.  Had a flare three months ago.  Used clobetasole daily for 2 weeks and now feels better.  Needs refill of clobetasole.  Using Premarin vaginal cream.  Needs refill.  History of ostpepenia.  Has not treated to date.  Has a history of esophageal erosion.  Takes Vitamin D 1000 IU per day. Takes Calcium 600 mg daily.  MVI also daily.  Calcium servings - 2 servings per day.    Occasional urinary leakage.  No leak with laugh.   Leaks with key in lock.  Some urgency and incontinence - once a month.  Up once a night to void.   No frequency during the day.    No LMP recorded. Patient has had a hysterectomy.          Sexually active: yes  The current method of family planning is status post hysterectomy.    Exercising: walking and biking Last mammogram:  06/2012 wnl:[facility] Last pap smear:07/2008 wnl History of abnormal pap: no Smoking: no Alcohol: 7 glasses of wine per week. Last colonoscopy: 2006 wnl: [facility].  States she is almost due.   Last Bone Density:  06/2011:osteopenia: [facility] Last tetanus shot: within past 5 years Last cholesterol check: 04/2013 elevated:patient takes Zocor.  Has a high HDL level.  Urine: Neg   Family History  Problem Relation Age of Onset  . Hypertension Mother   . Diabetes Father   . Hypertension Father   . Diabetes Sister   . Hypertension Sister   . Thyroid disease Sister     There are no active problems to display for this patient.   Past Medical History  Diagnosis Date  . Hypertension   . Hyperlipidemia   . Osteopenia     Past Surgical History  Procedure Laterality Date  . Neck fusion      C-4, C-5 fusion  . Arthroscopic shoulder Left     Dr. Cleophas Dunker  . Arthroscopic knee Bilateral     Dr. Cleophas Dunker  . Cholecystectomy      Dr. Lebron Conners   . Appendectomy      Dr. Daphine Deutscher  . Abdominal hysterectomy  1995    [facility]--still has ovaries  . Bladder suspension    . Abdominal sacrocolpopexy  2007    Halban culdoplasty & Burch procedure--Dr. Edward Jolly  . Other surgical history  2011    posterior Colporrhaphy with Xenform biological graft--Dr. Edward Jolly  . Rectocele repair  2011    posterior colporrhaphy with Xenform biological graft--Dr. Edward Jolly    Allergies: Augmentin  Current Outpatient Prescriptions  Medication Sig Dispense Refill  . aspirin 81 MG tablet Take 81 mg by mouth daily.      . beta carotene w/minerals (OCUVITE) tablet Take 1 tablet by mouth daily.      . calcium carbonate (OS-CAL) 600 MG TABS tablet Take 600 mg by mouth 2 (two) times daily with a meal.      . cetirizine (ZYRTEC) 10 MG tablet Take 10 mg by mouth daily.      . cholecalciferol (VITAMIN D) 1000 UNITS tablet Take 1,000 Units by mouth daily.      . clobetasol ointment (TEMOVATE) 0.05 % Apply 1 application topically 2 (two) times a week.      Marland Kitchen  Coenzyme Q10 (COQ10) 200 MG CAPS Take 1 tablet by mouth daily.      Marland Kitchen DIOVAN 80 MG tablet Take 1 tablet by mouth daily.      Marland Kitchen docusate sodium (COLACE) 100 MG capsule Take 100 mg by mouth as needed for constipation.      Marland Kitchen EPIPEN 2-PAK 0.3 MG/0.3ML SOAJ injection Inject 1 mg into the muscle as needed.      . fish oil-omega-3 fatty acids 1000 MG capsule Take 2 g by mouth daily.      Marland Kitchen lactobacillus acidophilus (BACID) TABS tablet Take 2 tablets by mouth 2 (two) times a week.      . Multiple Vitamin (MULTIVITAMIN) capsule Take 1 capsule by mouth daily.      Marland Kitchen NEXIUM 40 MG capsule Take 1 capsule by mouth daily.      . Olopatadine HCl (PATANASE) 0.6 % SOLN Place 1 puff into the nose as needed.      Marland Kitchen PREMARIN vaginal cream Place 1 Applicatorful vaginally 2 (two) times a week.      . simvastatin (ZOCOR) 10 MG tablet Take 1 tablet by mouth daily.      . vitamin B-12 (CYANOCOBALAMIN) 1000 MCG tablet Take 1,000 mcg by mouth daily.       . Vitamin Mixture (ESTER-C) 500-60 MG TABS Take 1 capsule by mouth daily.       No current facility-administered medications for this visit.    ROS: Pertinent items are noted in HPI.  Social Hx:  Married. Sister just moved back to [place] area.  Exam:    BP 136/70  Pulse 64  Ht 5' 3.75" (1.619 m)  Wt 145 lb 8 oz (65.998 kg)  BMI 25.18 kg/m2   Wt Readings from Last 3 Encounters:  08/02/13 145 lb 8 oz (65.998 kg)     Ht Readings from Last 3 Encounters:  08/02/13 5' 3.75" (1.619 m)    General appearance: alert, cooperative and appears stated age Head: Normocephalic, without obvious abnormality, atraumatic Neck: no adenopathy, supple, symmetrical, trachea midline and thyroid not enlarged, symmetric, no tenderness/mass/nodules Lungs: clear to auscultation bilaterally Breasts: Inspection negative, No nipple retraction or dimpling, No nipple discharge or bleeding, No axillary or supraclavicular adenopathy, Normal to palpation without dominant masses Heart: regular rate and rhythm Abdomen: soft, non-tender; bowel sounds normal; no masses,  no organomegaly Extremities: extremities normal, atraumatic, no cyanosis or edema Skin: Skin color, texture, turgor normal. No rashes or lesions Lymph nodes: Cervical, supraclavicular, and axillary nodes normal. No abnormal inguinal nodes palpated Neurologic: Grossly normal   Pelvic: External genitalia:  no lesions              Urethra:  normal appearing urethra with no masses, tenderness or lesions              Bartholins and Skenes: normal                 Vagina: normal appearing vagina with normal color and discharge, no lesions.  Good apical and posterior vaginal wall support.  Good urethral support.  First degree cystocele.              Cervix:  Absent.              Pap taken: no        Bimanual Exam:  Uterus:   Absent.  Adnexa: normal adnexa in size, nontender and no masses                                       Rectovaginal: Confirms                                      Anus:  normal sphincter tone, no lesions  Assessment  Status post abdominal sacral colpopexy/Burch procedure. Status post posterior colporrhaphy with dermal graft. First degree cystocele - asymptomatic. Osteopenia. Lichen sclerosis.     P:     [facility]  Patient will call to schedule. pap smear not indicated. Bone density due - [facility].  Patient will call to schedule.  Discussed possible Evista therapy for osteopenia/osteoporosis. Vitamin D level. Rx for Clobetasol.  See orders. Rx for Premarin vagina cream.  See orders. Observation of small cystocele.    return annually or prn     An After Visit Summary was printed and given to the patient.

## 2013-08-02 NOTE — Patient Instructions (Signed)

## 2013-08-05 ENCOUNTER — Telehealth: Payer: Self-pay

## 2013-08-05 NOTE — Telephone Encounter (Signed)
LMOVM to call for lab results. 

## 2013-08-05 NOTE — Telephone Encounter (Signed)
Message copied by Alphonsa Overall on Thu Aug 05, 2013  4:17 PM ------      Message from: Conley Simmonds      Created: Thu Aug 05, 2013  7:05 AM       Please inform patient of vitamin D level.      She can actually reduce her vitamin D to 600 - 800 IU daily.      Goal is to be between 30 - 50. ------

## 2013-08-06 NOTE — Telephone Encounter (Signed)
Patient returning call to Amanda. °

## 2013-08-06 NOTE — Telephone Encounter (Signed)
Patient notified vitamin D level 63.  Okay to reduce vitamin D to 600 - 800 IU daily.

## 2013-08-10 ENCOUNTER — Other Ambulatory Visit: Payer: Self-pay | Admitting: Obstetrics and Gynecology

## 2013-08-10 DIAGNOSIS — M8580 Other specified disorders of bone density and structure, unspecified site: Secondary | ICD-10-CM

## 2013-08-25 ENCOUNTER — Ambulatory Visit
Admission: RE | Admit: 2013-08-25 | Discharge: 2013-08-25 | Disposition: A | Discharge status: Home / Self Care | Payer: Medicare Other | Source: Ambulatory Visit | Attending: Obstetrics and Gynecology | Admitting: Obstetrics and Gynecology

## 2013-08-25 DIAGNOSIS — Z01419 Encounter for gynecological examination (general) (routine) without abnormal findings: Secondary | ICD-10-CM

## 2013-08-31 ENCOUNTER — Telehealth: Payer: Self-pay | Admitting: Emergency Medicine

## 2013-08-31 NOTE — Telephone Encounter (Signed)
Received [facility] copy of DXA results per Dr. Elinor Dodge patient and schedule f/u appointment to discuss results. Assist with appointment to discuss results with Dr. Edward Jolly.  Left message to return call to office.

## 2013-09-01 NOTE — Telephone Encounter (Signed)
Patient is returning a call to Triage °

## 2013-09-01 NOTE — Telephone Encounter (Signed)
Spoke with patient, appointment booked,.

## 2013-09-09 ENCOUNTER — Ambulatory Visit (INDEPENDENT_AMBULATORY_CARE_PROVIDER_SITE_OTHER): Payer: Medicare Other | Admitting: Obstetrics and Gynecology

## 2013-09-09 ENCOUNTER — Encounter: Payer: Self-pay | Admitting: Obstetrics and Gynecology

## 2013-09-09 VITALS — BP 120/70 | HR 70 | Ht 63.75 in | Wt 146.0 lb

## 2013-09-09 DIAGNOSIS — M810 Age-related osteoporosis without current pathological fracture: Secondary | ICD-10-CM

## 2013-09-09 MED ORDER — RALOXIFENE HCL 60 MG PO TABS: 60.0000 mg | ORAL_TABLET | Freq: Every day | ORAL | Status: DC

## 2013-09-09 NOTE — Patient Instructions (Signed)
Raloxifene tablets What is this medicine? RALOXIFENE (ral OX i feen) reduces the amount of calcium lost from bones. It is used to treat and prevent osteoporosis in women who have experienced menopause. This medicine may be used for other purposes; ask your health care provider or pharmacist if you have questions. What should I tell my health care provider before I take this medicine? They need to know if you have any of these conditions: -a history of blood clots -cancer -heart failure -liver disease -premenopausal -an unusual or allergic reaction to raloxifene, other medicines, foods, dyes, or preservatives -pregnant or trying to get pregnant -breast-feeding How should I use this medicine? Take this medicine by mouth with a glass of water. Follow the directions on the prescription label. The tablets can be taken with or without food. Take your doses at regular intervals. Do not take your medicine more often than directed. Talk to your pediatrician regarding the use of this medicine in children. Special care may be needed. Overdosage: If you think you have taken too much of this medicine contact a poison control center or emergency room at once. NOTE: This medicine is only for you. Do not share this medicine with others. What if I miss a dose? If you miss a dose, take it as soon as you can. If it is almost time for your next dose, take only that dose. Do not take double or extra doses. What may interact with this medicine? -ampicillin -cholestyramine -colestipol -diazepam -diazoxide -female hormones like hormone replacement therapy -lidocaine -warfarin This list may not describe all possible interactions. Give your health care provider a list of all the medicines, herbs, non-prescription drugs, or dietary supplements you use. Also tell them if you smoke, drink alcohol, or use illegal drugs. Some items may interact with your medicine. What should I watch for while using this  medicine? Visit your doctor or health care professional for regular checks on your progress. Do not stop taking this medicine except on the advice of your doctor or health care professional. You should make sure you get enough calcium and vitamin D in your diet while you are taking this medicine. Discuss your dietary needs with your health care professional or nutritionist. Exercise may help to prevent bone loss. Discuss your exercise needs with your doctor or health care professional. This medicine can rarely cause blood clots. You should avoid long periods of bed rest while taking this medicine. If you are going to have surgery, tell your doctor or health care professional that you are taking this medicine. This medicine should be stopped at least 3 days before surgery. After surgery, it should be restarted only after you are walking again. It should not be restarted while you still need long periods of bed rest. You should not smoke while taking this medicine. Smoking may also increase your risk of blood clots. Smoking can also decrease the effects of this medicine. This medicine does not prevent hot flashes. It may cause hot flashes in some patients at the start of therapy. What side effects may I notice from receiving this medicine? Side effects that you should report to your doctor or health care professional as soon as possible: -change in vision -chest pain -difficulty breathing -leg pain or swelling -skin rash, itching Side effects that usually do not require medical attention (report to your doctor or health care professional if they continue or are bothersome): -fluid build-up -leg cramps -stomach pain -sweating This list may not describe all possible side  effects. Call your doctor for medical advice about side effects. You may report side effects to FDA at 1-800-FDA-1088. Where should I keep my medicine? Keep out of the reach of children. Store at room temperature between 15 and 30  degrees C (59 and 86 degrees F). Throw away any unused medicine after the expiration date. NOTE: This sheet is a summary. It may not cover all possible information. If you have questions about this medicine, talk to your doctor, pharmacist, or health care provider.  2013, Elsevier/Gold Standard. (11/17/2008 3:15:14 PM)

## 2013-09-09 NOTE — Progress Notes (Signed)
Patient ID: Sarah [name], female   DOB: [birth year], 67 y.o.   MRN: [id number]  Subjective  Patient is here to discuss bone density showing osteoporosis.  Bone density done at [facility] on 08/20/13.   T score of the right hip - 2.6. FRAX model - risk of hip fracture 9%, risk of major osteoporotic fracture 38.2% - over 10 years. 5.7% loss of bone density in left femoral neck and 4.45 loss of bone density in right femoral neck. No significant change in the spine.  Patient has reduced Vit D supplement to 400 IU per day as her last Vit D level was 63 one month ago and she was on 1000 IU at that time.  Takes calcium cabonate. Exercises and is active.  History of esophageal reflux and esophageal stricture with dilation.  Objective  No exam  Assessment  Osteoporosis   Plan  I discussed Ca.Vit D/weight bearing exercise with the patient. I discussed moderate ETOH use. I discussed how to make her home environment more safe against falls. We reviewed pharmacologic options - oral bisphonsphonates, Evista, Prolia, calcitonin, Reclast, and Forteo. Will start Evista 60 mg and take one tab every other day for two weeks and then one by mouth daily.  Side effects and risk of fatal stroke discussed with the patient. The patient asked about bone markers and I encouraged a bone density in two years instead of following bone markers at this point. Patient declines consultation with endocrinologist.  Follow up in 6 weeks.

## 2013-10-21 ENCOUNTER — Other Ambulatory Visit: Payer: Self-pay

## 2013-10-21 ENCOUNTER — Encounter: Payer: Self-pay | Admitting: Obstetrics and Gynecology

## 2013-10-21 ENCOUNTER — Ambulatory Visit (INDEPENDENT_AMBULATORY_CARE_PROVIDER_SITE_OTHER): Payer: Medicare Other | Admitting: Obstetrics and Gynecology

## 2013-10-21 VITALS — BP 122/78 | HR 60 | Wt 148.5 lb

## 2013-10-21 DIAGNOSIS — M810 Age-related osteoporosis without current pathological fracture: Secondary | ICD-10-CM

## 2013-10-21 NOTE — Patient Instructions (Signed)
Please stop the Evista!  We can re-evaluate next year and decide if we want to do an early bone density or consider another treatment option.

## 2013-10-21 NOTE — Progress Notes (Signed)
Patient ID: Sarah [name], female   DOB: [birth year], 68 y.o.   MRN: [id number]  Subjective  Patient is here for a recheck of Evista for osteoporosis treatment. After took medication for one week developed side effects. Heavy legs, shortness of breath. Developed walking pneumonia.  Took steroid injections and a Z pack twice. Saw her allergist recently and diagnosed with sinusitis.  Now on antibiotic for this as well. Took last Evista 3 days ago. Energy is slowly returning.   She states, "I just want my life back." Has a lot of plans for the holidays and entertaining family and going to the beach.   Patient usually is very active and has a healthy diet.    History of esophageal reflux and esophageal stricture with dilation.   Some right sided breast pain while using the Premarin cream.  Normal mammogram in August 2014.   Flu vaccine 09/29/13. Pneumococcus vaccine 3 -4 years ago.   Objective  BP 122/78 Weight 148.5  Bone density done at [facility] on 08/20/13.  T score of the right hip - 2.6.  FRAX model - risk of hip fracture 9%, risk of major osteoporotic fracture 38.2% - over 10 years.  5.7% loss of bone density in left femoral neck and 4.4% loss of bone density in right femoral neck.  No significant change in the spine.   Assessment  Osteoporosis. Intolerant of Evista. History of esophageal stricture. Recent steroid use.  Allergies.   Plan  Stop Evista. Continue health diet with Ca/Vit D. Continue exercise. Re-evaluate next year with annual exam. May choose to do an early bone density instead of waiting 2 years.   20 minutes spent face to face time of which over 50% was spent in counseling.   An after visit summary to the patient.

## 2013-11-06 ENCOUNTER — Emergency Department (HOSPITAL_COMMUNITY): Payer: Medicare Other

## 2013-11-06 ENCOUNTER — Inpatient Hospital Stay (HOSPITAL_COMMUNITY)
Admission: EM | Admit: 2013-11-06 | Discharge: 2013-11-09 | DRG: 470 | Disposition: A | Discharge status: Home Health | Payer: Medicare Other | Attending: Internal Medicine | Admitting: Internal Medicine

## 2013-11-06 ENCOUNTER — Encounter (HOSPITAL_COMMUNITY): Payer: Self-pay | Admitting: Emergency Medicine

## 2013-11-06 DIAGNOSIS — Z9109 Other allergy status, other than to drugs and biological substances: Secondary | ICD-10-CM | POA: Diagnosis present

## 2013-11-06 DIAGNOSIS — I10 Essential (primary) hypertension: Secondary | ICD-10-CM | POA: Diagnosis present

## 2013-11-06 DIAGNOSIS — Z981 Arthrodesis status: Secondary | ICD-10-CM

## 2013-11-06 DIAGNOSIS — Z8249 Family history of ischemic heart disease and other diseases of the circulatory system: Secondary | ICD-10-CM

## 2013-11-06 DIAGNOSIS — Z79899 Other long term (current) drug therapy: Secondary | ICD-10-CM

## 2013-11-06 DIAGNOSIS — Z833 Family history of diabetes mellitus: Secondary | ICD-10-CM

## 2013-11-06 DIAGNOSIS — J301 Allergic rhinitis due to pollen: Secondary | ICD-10-CM | POA: Diagnosis present

## 2013-11-06 DIAGNOSIS — S72002A Fracture of unspecified part of neck of left femur, initial encounter for closed fracture: Secondary | ICD-10-CM

## 2013-11-06 DIAGNOSIS — S72009A Fracture of unspecified part of neck of unspecified femur, initial encounter for closed fracture: Secondary | ICD-10-CM

## 2013-11-06 DIAGNOSIS — M899 Disorder of bone, unspecified: Secondary | ICD-10-CM | POA: Diagnosis present

## 2013-11-06 DIAGNOSIS — W108XXA Fall (on) (from) other stairs and steps, initial encounter: Secondary | ICD-10-CM | POA: Diagnosis present

## 2013-11-06 DIAGNOSIS — S72002D Fracture of unspecified part of neck of left femur, subsequent encounter for closed fracture with routine healing: Secondary | ICD-10-CM

## 2013-11-06 DIAGNOSIS — S72033A Displaced midcervical fracture of unspecified femur, initial encounter for closed fracture: Principal | ICD-10-CM | POA: Diagnosis present

## 2013-11-06 DIAGNOSIS — M810 Age-related osteoporosis without current pathological fracture: Secondary | ICD-10-CM

## 2013-11-06 DIAGNOSIS — D72829 Elevated white blood cell count, unspecified: Secondary | ICD-10-CM | POA: Diagnosis not present

## 2013-11-06 DIAGNOSIS — Y92009 Unspecified place in unspecified non-institutional (private) residence as the place of occurrence of the external cause: Secondary | ICD-10-CM

## 2013-11-06 DIAGNOSIS — W010XXA Fall on same level from slipping, tripping and stumbling without subsequent striking against object, initial encounter: Secondary | ICD-10-CM | POA: Diagnosis present

## 2013-11-06 DIAGNOSIS — Z7901 Long term (current) use of anticoagulants: Secondary | ICD-10-CM

## 2013-11-06 DIAGNOSIS — Z9089 Acquired absence of other organs: Secondary | ICD-10-CM

## 2013-11-06 DIAGNOSIS — E785 Hyperlipidemia, unspecified: Secondary | ICD-10-CM | POA: Diagnosis present

## 2013-11-06 HISTORY — DX: Age-related osteoporosis without current pathological fracture: M81.0

## 2013-11-06 LAB — CBC WITH DIFFERENTIAL/PLATELET
Eosinophils Relative: 0 % (ref 0–5)
HCT: 36.4 % (ref 36.0–46.0)
Hemoglobin: 13 g/dL (ref 12.0–15.0)
Lymphocytes Relative: 7 % — ABNORMAL LOW (ref 12–46)
MCHC: 35.7 g/dL (ref 30.0–36.0)
MCV: 88.1 fL (ref 78.0–100.0)
Monocytes Absolute: 0.4 10*3/uL (ref 0.1–1.0)
Monocytes Relative: 3 % (ref 3–12)
Neutro Abs: 12.1 10*3/uL — ABNORMAL HIGH (ref 1.7–7.7)
Neutrophils Relative %: 90 % — ABNORMAL HIGH (ref 43–77)
RBC: 4.13 MIL/uL (ref 3.87–5.11)
WBC: 13.5 10*3/uL — ABNORMAL HIGH (ref 4.0–10.5)

## 2013-11-06 LAB — COMPREHENSIVE METABOLIC PANEL
AST: 28 U/L (ref 0–37)
Alkaline Phosphatase: 70 U/L (ref 39–117)
BUN: 17 mg/dL (ref 6–23)
CO2: 23 mEq/L (ref 19–32)
Chloride: 105 mEq/L (ref 96–112)
Creatinine, Ser: 0.9 mg/dL (ref 0.50–1.10)
GFR calc non Af Amer: 65 mL/min — ABNORMAL LOW (ref >90)
Potassium: 3.7 mEq/L (ref 3.5–5.1)
Total Bilirubin: 0.3 mg/dL (ref 0.3–1.2)
Total Protein: 6.7 g/dL (ref 6.0–8.3)

## 2013-11-06 LAB — PROTIME-INR
INR: 0.86 (ref 0.00–1.49)
Prothrombin Time: 11.6 seconds (ref 11.6–15.2)

## 2013-11-06 LAB — ABO/RH: ABO/RH(D): O POS

## 2013-11-06 LAB — TYPE AND SCREEN

## 2013-11-06 MED ORDER — SODIUM CHLORIDE 0.9 % IV SOLN
INTRAVENOUS | Status: DC
  Administered 2013-11-06: 19:00:00 via INTRAVENOUS

## 2013-11-06 MED ORDER — METHOCARBAMOL 500 MG PO TABS: 500.0000 mg | ORAL_TABLET | Freq: Three times a day (TID) | ORAL | Status: DC | PRN

## 2013-11-06 MED ORDER — OCUVITE PO TABS
1.0000 | ORAL_TABLET | Freq: Every day | ORAL | Status: DC
  Administered 2013-11-08 – 2013-11-09 (×2): 1 via ORAL
  Filled 2013-11-06 (×3): qty 1

## 2013-11-06 MED ORDER — MAGNESIUM HYDROXIDE 400 MG/5ML PO SUSP: 30.0000 mL | Freq: Every day | ORAL | Status: DC | PRN

## 2013-11-06 MED ORDER — ACETAMINOPHEN 325 MG PO TABS
650.0000 mg | ORAL_TABLET | Freq: Once | ORAL | Status: AC
  Administered 2013-11-06: 650 mg via ORAL
  Filled 2013-11-06: qty 2

## 2013-11-06 MED ORDER — DOCUSATE SODIUM 100 MG PO CAPS
100.0000 mg | ORAL_CAPSULE | Freq: Every day | ORAL | Status: DC | PRN
Start: 2013-11-06 — End: 2013-11-09

## 2013-11-06 MED ORDER — ONDANSETRON HCL 4 MG/2ML IJ SOLN: 4.0000 mg | Freq: Four times a day (QID) | INTRAMUSCULAR | Status: DC | PRN

## 2013-11-06 MED ORDER — BACID PO TABS
2.0000 | ORAL_TABLET | ORAL | Status: DC
  Administered 2013-11-08: 2 via ORAL
  Filled 2013-11-06: qty 2

## 2013-11-06 MED ORDER — METHOCARBAMOL 100 MG/ML IJ SOLN
500.0000 mg | Freq: Four times a day (QID) | INTRAVENOUS | Status: DC | PRN
  Filled 2013-11-06: qty 5

## 2013-11-06 MED ORDER — DOCUSATE SODIUM 100 MG PO CAPS
100.0000 mg | ORAL_CAPSULE | Freq: Two times a day (BID) | ORAL | Status: DC
  Administered 2013-11-06: 100 mg via ORAL
  Filled 2013-11-06 (×3): qty 1

## 2013-11-06 MED ORDER — BISACODYL 10 MG RE SUPP: 10.0000 mg | Freq: Every day | RECTAL | Status: DC | PRN

## 2013-11-06 MED ORDER — FLUTICASONE PROPIONATE 50 MCG/ACT NA SUSP
1.0000 | Freq: Every day | NASAL | Status: DC
  Filled 2013-11-06: qty 16

## 2013-11-06 MED ORDER — VANCOMYCIN HCL IN DEXTROSE 1-5 GM/200ML-% IV SOLN
1000.0000 mg | INTRAVENOUS | Status: AC
  Administered 2013-11-07: 1000 mg via INTRAVENOUS
  Filled 2013-11-06: qty 200

## 2013-11-06 MED ORDER — CHOLECALCIFEROL 400 UNIT/ML PO LIQD
400.0000 [IU] | Freq: Every day | ORAL | Status: DC
  Administered 2013-11-08 – 2013-11-09 (×2): 400 [IU] via ORAL
  Filled 2013-11-06 (×3): qty 1

## 2013-11-06 MED ORDER — CALCIUM CARBONATE 600 MG PO TABS
600.0000 mg | ORAL_TABLET | Freq: Two times a day (BID) | ORAL | Status: DC
  Filled 2013-11-06: qty 1

## 2013-11-06 MED ORDER — IRBESARTAN 75 MG PO TABS: 75.0000 mg | ORAL_TABLET | Freq: Every day | ORAL | Status: DC

## 2013-11-06 MED ORDER — ENOXAPARIN SODIUM 40 MG/0.4ML ~~LOC~~ SOLN: 40.0000 mg | SUBCUTANEOUS | Status: DC

## 2013-11-06 MED ORDER — ACETAMINOPHEN 650 MG RE SUPP: 650.0000 mg | Freq: Four times a day (QID) | RECTAL | Status: DC | PRN

## 2013-11-06 MED ORDER — PANTOPRAZOLE SODIUM 40 MG PO TBEC
40.0000 mg | DELAYED_RELEASE_TABLET | Freq: Every day | ORAL | Status: DC
  Administered 2013-11-08 – 2013-11-09 (×2): 40 mg via ORAL
  Filled 2013-11-06 (×2): qty 1

## 2013-11-06 MED ORDER — CALCIUM CARBONATE 1250 (500 CA) MG PO TABS
1.0000 | ORAL_TABLET | Freq: Two times a day (BID) | ORAL | Status: DC
  Administered 2013-11-08 – 2013-11-09 (×3): 500 mg via ORAL
  Filled 2013-11-06 (×7): qty 1

## 2013-11-06 MED ORDER — FLEET ENEMA 7-19 GM/118ML RE ENEM: 1.0000 | ENEMA | Freq: Once | RECTAL | Status: AC | PRN

## 2013-11-06 MED ORDER — HEPARIN SODIUM (PORCINE) 5000 UNIT/ML IJ SOLN
5000.0000 [IU] | Freq: Once | INTRAMUSCULAR | Status: DC
  Filled 2013-11-06: qty 1

## 2013-11-06 MED ORDER — HYDROMORPHONE HCL PF 1 MG/ML IJ SOLN: 0.5000 mg | INTRAMUSCULAR | Status: DC | PRN

## 2013-11-06 MED ORDER — ACETAMINOPHEN 325 MG PO TABS
650.0000 mg | ORAL_TABLET | Freq: Four times a day (QID) | ORAL | Status: DC | PRN
  Administered 2013-11-06 – 2013-11-09 (×3): 650 mg via ORAL
  Filled 2013-11-06 (×3): qty 2

## 2013-11-06 MED ORDER — LORATADINE 10 MG PO TABS
10.0000 mg | ORAL_TABLET | Freq: Every day | ORAL | Status: DC
  Administered 2013-11-08 – 2013-11-09 (×2): 10 mg via ORAL
  Filled 2013-11-06 (×3): qty 1

## 2013-11-06 MED ORDER — ONDANSETRON HCL 4 MG PO TABS: 4.0000 mg | ORAL_TABLET | Freq: Four times a day (QID) | ORAL | Status: DC | PRN

## 2013-11-06 MED ORDER — ONDANSETRON HCL 4 MG/2ML IJ SOLN: 4.0000 mg | Freq: Three times a day (TID) | INTRAMUSCULAR | Status: DC | PRN

## 2013-11-06 MED ORDER — HEPARIN SODIUM (PORCINE) 5000 UNIT/ML IJ SOLN
5000.0000 [IU] | Freq: Three times a day (TID) | INTRAMUSCULAR | Status: DC
  Administered 2013-11-06: 5000 [IU] via SUBCUTANEOUS
  Filled 2013-11-06 (×3): qty 1

## 2013-11-06 MED ORDER — HYDROMORPHONE HCL 2 MG PO TABS: 2.0000 mg | ORAL_TABLET | ORAL | Status: DC | PRN

## 2013-11-06 NOTE — Consult Note (Signed)
Norlene Campbell, MD                  Jacqualine Code, PA-C   [address] [place], [place], [place]  [address]   ORTHOPAEDIC CONSULTATION  Sarah [name]            MRN:  [id number] DOB/SEX:  [birth year]/female    REQUESTING PHYSICIAN:  [facility]  CHIEF COMPLAINT:  Painful left hip  HISTORY: Sarah Purington Riersonis a 67 y.o. female with a painful left hip from a fall at church.  Apparently was in stocking feet and came down carpeted stairs onto linoleum floor and fell landing onto her left hip with immediate pain and inability to bear weight.  Brought to ER where x-rays revealed a left femoral neck fracture with mild displacement.  Denies neurovascular compromise at this time.  Seen for possible orthopedic procedure    PAST MEDICAL HISTORY: There are no active problems to display for this patient.  Past Medical History  Diagnosis Date  . Hypertension   . Hyperlipidemia   . Osteopenia   . Osteoporosis    Past Surgical History  Procedure Laterality Date  . Neck fusion      C-4, C-5 fusion  . Arthroscopic shoulder Left     Dr. Cleophas Dunker  . Arthroscopic knee Bilateral     Dr. Cleophas Dunker  . Cholecystectomy      Dr. Lebron Conners  . Appendectomy      Dr. Daphine Deutscher  . Abdominal hysterectomy  1995    TVH--still has ovaries  . Bladder suspension    . Abdominal sacrocolpopexy  2007    Halban culdoplasty & Burch procedure--Dr. Edward Jolly  . Other surgical history  2011    posterior Colporrhaphy with Xenform biological graft--Dr. Edward Jolly  . Rectocele repair  2011    posterior colporrhaphy with Xenform biological graft--Dr. Edward Jolly  . Esophageal dilation       MEDICATIONS:    No current facility-administered medications for this encounter. Current outpatient prescriptions:aspirin 81 MG tablet, Take 81 mg by mouth daily., Disp: , Rfl: ;  beta carotene w/minerals (OCUVITE) tablet, Take 1 tablet by mouth daily., Disp: , Rfl: ;  calcium carbonate (OS-CAL) 600 MG TABS tablet, Take 600 mg by  mouth 2 (two) times daily with a meal., Disp: , Rfl: ;  cetirizine (ZYRTEC) 10 MG tablet, Take 10 mg by mouth daily., Disp: , Rfl:  cholecalciferol (D-VI-SOL) 400 UNIT/ML LIQD, Take 400 Units by mouth daily., Disp: , Rfl: ;  clobetasol ointment (TEMOVATE) 0.05 %, Apply 1 application topically 2 (two) times a week., Disp: 30 g, Rfl: 1;  Coenzyme Q10 (COQ10) 200 MG CAPS, Take 1 tablet by mouth daily., Disp: , Rfl: ;  conjugated estrogens (PREMARIN) vaginal cream, Place vaginally daily. 1/2 g vaginally two times per week as needed to maintain symptom relief., Disp: 42 g, Rfl: 3 DIOVAN 80 MG tablet, Take 80 mg by mouth daily. , Disp: , Rfl: ;  docusate sodium (COLACE) 100 MG capsule, Take 100 mg by mouth as needed for constipation., Disp: , Rfl: ;  EPIPEN 2-PAK 0.3 MG/0.3ML SOAJ injection, Inject 1 mg into the muscle as needed., Disp: , Rfl: ;  fish oil-omega-3 fatty acids 1000 MG capsule, Take 2 g by mouth daily., Disp: , Rfl:  fluticasone (FLONASE) 50 MCG/ACT nasal spray, Place 50 sprays into the nose daily., Disp: , Rfl: ;  lactobacillus acidophilus (BACID) TABS tablet, Take 2 tablets by mouth 2 (two) times a week., Disp: , Rfl: ;  Multiple Vitamin (MULTIVITAMIN) capsule, Take 1 capsule by mouth daily., Disp: , Rfl: ;  NEXIUM 40 MG capsule, Take 1 capsule by mouth daily., Disp: , Rfl:  Olopatadine HCl (PATANASE) 0.6 % SOLN, Place 1 puff into the nose as needed., Disp: , Rfl: ;  simvastatin (ZOCOR) 10 MG tablet, Take 1 tablet by mouth daily., Disp: , Rfl: ;  vitamin B-12 (CYANOCOBALAMIN) 1000 MCG tablet, Take 1,000 mcg by mouth daily., Disp: , Rfl: ;  Vitamin Mixture (ESTER-C) 500-60 MG TABS, Take 1 capsule by mouth daily., Disp: , Rfl:   ALLERGIES:   Allergies  Allergen Reactions  . Evista [Raloxifene]     Respiratory infections, shortness of breath(not anaphylaxis), leg heaviness.  . Augmentin [Amoxicillin-Pot Clavulanate] Hives    REVIEW OF SYSTEMS: REVIEWED IN DETAIL IN CHART  FAMILY HISTORY:     Family History  Problem Relation Age of Onset  . Hypertension Mother   . Diabetes Father   . Hypertension Father   . Diabetes Sister   . Hypertension Sister   . Thyroid disease Sister     SOCIAL HISTORY:   History  Substance Use Topics  . Smoking status: Never Smoker   . Smokeless tobacco: Not on file  . Alcohol Use: Yes     Comment: 7 glasses of wine per week     EXAMINATION: Vital signs in last 24 hours: Temp:  [97.7 F (36.5 C)-98.8 F (37.1 C)] 98.8 F (37.1 C) (11/22 1447) Pulse Rate:  [61-69] 69 (11/22 1447) Resp:  [16-18] 18 (11/22 1447) BP: (113-131)/(61-76) 113/76 mmHg (11/22 1447) SpO2:  [97 %] 97 % (11/22 1447)  BP 113/76  Pulse 69  Temp(Src) 98.8 F (37.1 C) (Oral)  Resp 18  SpO2 97%  General Appearance:    Alert, cooperative, no distress, appears stated age  Head:    Normocephalic, without obvious abnormality, atraumatic  Eyes:    PERRL, conjunctiva/corneas clear, EOM's intact, fundi    benign, both eyes  Ears:    Normal TM's and external ear canals, both ears  Nose:   Nares normal, septum midline, mucosa normal, no drainage    or sinus tenderness  Throat:   Lips, mucosa, and tongue normal; teeth and gums normal  Neck:   Supple, symmetrical, trachea midline, no adenopathy;    thyroid:  no enlargement/tenderness/nodules; no carotid   bruit or JVD     Lungs:     Clear to auscultation bilaterally, respirations unlabored  Chest Wall:    No tenderness or deformity   Heart:    Regular rate and rhythm, S1 and S2 normal, no murmur, rub   or gallop  Breast Exam:    No tenderness, masses, or nipple abnormality  Abdomen:     Soft, non-tender, bowel sounds active all four quadrants,    no masses, no organomegaly        Extremities:   Left hip painful with ROM  Pulses:   1+ and symmetric all extremities  Skin:   Skin color, texture, turgor normal, no rashes or lesions     Neurologic:   CNII-XII grossly intact, normal strength, sensation   DIAGNOSTIC  STUDIES: Recent laboratory studies:  Recent Labs  11/06/13 1450  WBC 13.5*  HGB 13.0  HCT 36.4  PLT 152    Recent Labs  11/06/13 1450  NA 141  K 3.7  CL 105  CO2 23  BUN 17  CREATININE 0.90  GLUCOSE 99  CALCIUM 9.0   Lab Results  Component Value  Date   INR 0.86 11/06/2013   INR 0.96 03/12/2010     Recent Radiographic Studies :  Dg Chest 1 View  11/06/2013   CLINICAL DATA:  Preoperative evaluation  EXAM: CHEST - 1 VIEW  COMPARISON:  None.  FINDINGS: The heart size and mediastinal contours are within normal limits. Both lungs are clear. The visualized skeletal structures are unremarkable.  IMPRESSION: No active disease.   Electronically Signed   By: Salome Holmes M.D.   On: 11/06/2013 14:03   Dg Hip Complete Left  11/06/2013   CLINICAL DATA:  Left hip pain after fall.  EXAM: LEFT HIP - COMPLETE 2+ VIEW  COMPARISON:  None.  FINDINGS: Mildly displaced fracture is seen involving the proximal left femoral neck. Right hip appears normal. The left femoral head remains within the acetabulum.  IMPRESSION: Mildly displaced proximal left femoral neck fracture.   Electronically Signed   By: Roque Lias M.D.   On: 11/06/2013 13:36    ASSESSMENT: Left femoral neck fracture   PLAN: Left hip hemiarthroplasty (possible bipolar)  Discussed procedure, risks, and benefits with Sarah [name] and her husband and they are understanding and would like to proceed to surgery tomorrow around noon  Thank you for this interesting consultation      [facility] 11/06/2013, 3:55 PM

## 2013-11-06 NOTE — H&P (Signed)
[facility] History and Physical  JADI DEYARMIN ZOX:[id number] DOB: 11-05-1946 DOA: 11/06/2013  Referring physician: Dr. Silverio Lay PCP: Lilia Argue  Specialists: Orthopedic surgery  Chief Complaint: hip pain  HPI: Sarah [name] is a 67 y.o. female has a past medical history significant for HTN, HLD, seasonal allergies, comes in with left hip pain after falling at home. This was a mechanical fall as she slipped on tiles. Denies LOC or injuries other than her left hip. XR in the ED c/w with left femoral fracture. EDP consulted orthopedics surgery and she is scheduled for OR tomorrow am. Patient denies any heart problems (had a stress test 1 year ago which was normal), denies lung problems. She does not smoke, drink a glass of wine per day with dinner, no drugs. Denies chest pain, breathing difficulties. Able to exercise without difficulties. No abdominal pain, nausea or vomiting. Denies blood on stool or urine, no dysuria.   Review of Systems: as per HPI otherwise negative.   Past Medical History  Diagnosis Date  . Hypertension   . Hyperlipidemia   . Osteopenia   . Osteoporosis    Past Surgical History  Procedure Laterality Date  . Neck fusion      C-4, C-5 fusion  . Arthroscopic shoulder Left     Dr. Cleophas Dunker  . Arthroscopic knee Bilateral     Dr. Cleophas Dunker  . Cholecystectomy      Dr. Lebron Conners  . Appendectomy      Dr. Daphine Deutscher  . Abdominal hysterectomy  1995    TVH--still has ovaries  . Bladder suspension    . Abdominal sacrocolpopexy  2007    Halban culdoplasty & Burch procedure--Dr. Edward Jolly  . Other surgical history  2011    posterior Colporrhaphy with Xenform biological graft--Dr. Edward Jolly  . Rectocele repair  2011    posterior colporrhaphy with Xenform biological graft--Dr. Edward Jolly  . Esophageal dilation     Social History:  reports that she has never smoked. She does not have any smokeless tobacco history on file. She reports that she drinks alcohol. She  reports that she does not use illicit drugs.  Allergies  Allergen Reactions  . Evista [Raloxifene]     Respiratory infections, shortness of breath(not anaphylaxis), leg heaviness.  . Augmentin [Amoxicillin-Pot Clavulanate] Hives    Family History  Problem Relation Age of Onset  . Hypertension Mother   . Diabetes Father   . Hypertension Father   . Diabetes Sister   . Hypertension Sister   . Thyroid disease Sister    Prior to Admission medications   Medication Sig Start Date End Date Taking? Authorizing Provider  aspirin 81 MG tablet Take 81 mg by mouth daily.   Yes Historical Provider, MD  beta carotene w/minerals (OCUVITE) tablet Take 1 tablet by mouth daily.   Yes Historical Provider, MD  calcium carbonate (OS-CAL) 600 MG TABS tablet Take 600 mg by mouth 2 (two) times daily with a meal.   Yes Historical Provider, MD  cetirizine (ZYRTEC) 10 MG tablet Take 10 mg by mouth daily.   Yes Historical Provider, MD  cholecalciferol (D-VI-SOL) 400 UNIT/ML LIQD Take 400 Units by mouth daily.   Yes Historical Provider, MD  clobetasol ointment (TEMOVATE) 0.05 % Apply 1 application topically 2 (two) times a week. 08/02/13  Yes Melony Overly, MD  Coenzyme Q10 (COQ10) 200 MG CAPS Take 1 tablet by mouth daily.   Yes Historical Provider, MD  conjugated estrogens (PREMARIN) vaginal cream Place vaginally daily. 1/2  g vaginally two times per week as needed to maintain symptom relief. 08/02/13  Yes Melony Overly, MD  DIOVAN 80 MG tablet Take 80 mg by mouth daily.  06/07/13  Yes Historical Provider, MD  docusate sodium (COLACE) 100 MG capsule Take 100 mg by mouth as needed for constipation.   Yes Historical Provider, MD  EPIPEN 2-PAK 0.3 MG/0.3ML SOAJ injection Inject 1 mg into the muscle as needed. 06/29/13  Yes Historical Provider, MD  fish oil-omega-3 fatty acids 1000 MG capsule Take 2 g by mouth daily.   Yes Historical Provider, MD  fluticasone (FLONASE) 50 MCG/ACT nasal spray Place 50 sprays into the nose  daily. 09/08/13  Yes Historical Provider, MD  lactobacillus acidophilus (BACID) TABS tablet Take 2 tablets by mouth 2 (two) times a week.   Yes Historical Provider, MD  Multiple Vitamin (MULTIVITAMIN) capsule Take 1 capsule by mouth daily.   Yes Historical Provider, MD  NEXIUM 40 MG capsule Take 1 capsule by mouth daily. 05/02/13  Yes Historical Provider, MD  Olopatadine HCl (PATANASE) 0.6 % SOLN Place 1 puff into the nose as needed.   Yes Historical Provider, MD  simvastatin (ZOCOR) 10 MG tablet Take 1 tablet by mouth daily. 07/12/13  Yes Historical Provider, MD  vitamin B-12 (CYANOCOBALAMIN) 1000 MCG tablet Take 1,000 mcg by mouth daily.   Yes Historical Provider, MD  Vitamin Mixture (ESTER-C) 500-60 MG TABS Take 1 capsule by mouth daily.   Yes Historical Provider, MD   Physical Exam: Filed Vitals:   11/06/13 1237 11/06/13 1447  BP: 131/61 113/76  Pulse: 61 69  Temp: 97.7 F (36.5 C) 98.8 F (37.1 C)  TempSrc: Oral Oral  Resp: 16 18  SpO2: 97% 97%     General:  No apparent distress  Eyes: PERRL, EOMI, no scleral icterus  ENT: moist oropharynx  Neck: supple, no JVD  Cardiovascular: regular rate, faint 1/6 SEM; 2+ peripheral pulses  Respiratory: CTA biL, good air movement without wheezing, rhonchi or crackled  Abdomen: soft, non tender to palpation, positive bowel sounds, no guarding, no rebound  Skin: no rashes  Musculoskeletal: no peripheral edema  Psychiatric: normal mood and affect  Neurologic: CN 2-12 grossly intact, MS 5/5 in all 4  Labs on Admission:  Basic Metabolic Panel:  Recent Labs Lab 11/06/13 1450  NA 141  K 3.7  CL 105  CO2 23  GLUCOSE 99  BUN 17  CREATININE 0.90  CALCIUM 9.0   Liver Function Tests:  Recent Labs Lab 11/06/13 1450  AST 28  ALT 28  ALKPHOS 70  BILITOT 0.3  PROT 6.7  ALBUMIN 3.6   CBC:  Recent Labs Lab 11/06/13 1450  WBC 13.5*  NEUTROABS 12.1*  HGB 13.0  HCT 36.4  MCV 88.1  PLT 152   Radiological Exams on  Admission: Dg Chest 1 View  11/06/2013   CLINICAL DATA:  Preoperative evaluation  EXAM: CHEST - 1 VIEW  COMPARISON:  None.  FINDINGS: The heart size and mediastinal contours are within normal limits. Both lungs are clear. The visualized skeletal structures are unremarkable.  IMPRESSION: No active disease.   Electronically Signed   By: Salome Holmes M.D.   On: 11/06/2013 14:03   Dg Hip Complete Left  11/06/2013   CLINICAL DATA:  Left hip pain after fall.  EXAM: LEFT HIP - COMPLETE 2+ VIEW  COMPARISON:  None.  FINDINGS: Mildly displaced fracture is seen involving the proximal left femoral neck. Right hip appears normal. The left femoral  head remains within the acetabulum.  IMPRESSION: Mildly displaced proximal left femoral neck fracture.   Electronically Signed   By: Roque Lias M.D.   On: 11/06/2013 13:36   EKG: Independently reviewed. Sinus rhythm  Assessment/Plan Active Problems:   Hyperlipidemia   Hypertension   Hip fracture, left   Environmental allergies    Hip fracture - to OR tomorrow. Allow diet tonight and make NPO after midnight.  HLD - continue statin HTN - continue home medications Allergies - continue home medications   Diet: regular, NPO after midnight Fluids: none DVT Prophylaxis: SCDs  Code Status: Full (presumed) Family Communication: husband bedside Disposition Plan: inpatient  Time spent: 53  Costin M. Elvera Lennox, MD [facility] Pager [phone]  If 7PM-7AM, please contact night-coverage [email or web address] Password [facility] 11/06/2013, 4:13 PM

## 2013-11-06 NOTE — Progress Notes (Signed)
Orthopedic Tech Progress Note Patient Details:  Sarah [name] [birth year] [id number]  Patient ID: Sarah [name], female   DOB: March 06, 1946, 67 y.o.   MRN: [id number] Trapeze bar patient helper  Nikki Dom 11/06/2013, 5:54 PM

## 2013-11-06 NOTE — ED Notes (Signed)
Pt fell from a standing position.  Pt was in her stocking feet, went down carpeted stairs, stepped onto tiled floor.  Feet came out from under her and she fell hard on her left hip.  Pt c/o L hip pain and L groin pain.

## 2013-11-06 NOTE — ED Provider Notes (Signed)
CSN: [id number]     Arrival date & time 11/06/13  1232 History   First MD Initiated Contact with Patient 11/06/13 1251     Chief Complaint  Patient presents with  . Fall  . Hip Pain   (Consider location/radiation/quality/duration/timing/severity/associated sxs/prior Treatment) The history is provided by the patient.  Sarah [name] is a 67 y.o. female history of hypertension, hyperlipidemia, osteopenia he presented with left hip pain. She was walking with her stockings on came down the stairs and slipped on the tile floor. She let on her left hip and denies any head injury or loss consciousness. Unable to walk afterwards. Denies any other injury.    Past Medical History  Diagnosis Date  . Hypertension   . Hyperlipidemia   . Osteopenia   . Osteoporosis    Past Surgical History  Procedure Laterality Date  . Neck fusion      C-4, C-5 fusion  . Arthroscopic shoulder Left     Dr. Cleophas Dunker  . Arthroscopic knee Bilateral     Dr. Cleophas Dunker  . Cholecystectomy      Dr. Lebron Conners  . Appendectomy      Dr. Daphine Deutscher  . Abdominal hysterectomy  1995    [facility]--still has ovaries  . Bladder suspension    . Abdominal sacrocolpopexy  2007    Halban culdoplasty & Burch procedure--Dr. Edward Jolly  . Other surgical history  2011    posterior Colporrhaphy with Xenform biological graft--Dr. Edward Jolly  . Rectocele repair  2011    posterior colporrhaphy with Xenform biological graft--Dr. Edward Jolly  . Esophageal dilation     Family History  Problem Relation Age of Onset  . Hypertension Mother   . Diabetes Father   . Hypertension Father   . Diabetes Sister   . Hypertension Sister   . Thyroid disease Sister    History  Substance Use Topics  . Smoking status: Never Smoker   . Smokeless tobacco: Not on file  . Alcohol Use: Yes     Comment: 7 glasses of wine per week   OB History   Grav Para Term Preterm Abortions TAB SAB Ect Mult Living   2 1 1  1     1      Review of Systems  Musculoskeletal:        L hip pain   All other systems reviewed and are negative.    Allergies  Evista and Augmentin  Home Medications   Current Outpatient Rx  Name  Route  Sig  Dispense  Refill  . aspirin 81 MG tablet   Oral   Take 81 mg by mouth daily.         . beta carotene w/minerals (OCUVITE) tablet   Oral   Take 1 tablet by mouth daily.         . calcium carbonate (OS-CAL) 600 MG TABS tablet   Oral   Take 600 mg by mouth 2 (two) times daily with a meal.         . cetirizine (ZYRTEC) 10 MG tablet   Oral   Take 10 mg by mouth daily.         . cholecalciferol (D-VI-SOL) 400 UNIT/ML LIQD   Oral   Take 400 Units by mouth daily.         . clobetasol ointment (TEMOVATE) 0.05 %   Topical   Apply 1 application topically 2 (two) times a week.   30 g   1   . Coenzyme Q10 (COQ10)  200 MG CAPS   Oral   Take 1 tablet by mouth daily.         Marland Kitchen conjugated estrogens (PREMARIN) vaginal cream   Vaginal   Place vaginally daily. 1/2 g vaginally two times per week as needed to maintain symptom relief.   42 g   3   . DIOVAN 80 MG tablet   Oral   Take 80 mg by mouth daily.          Marland Kitchen docusate sodium (COLACE) 100 MG capsule   Oral   Take 100 mg by mouth as needed for constipation.         Marland Kitchen EPIPEN 2-PAK 0.3 MG/0.3ML SOAJ injection   Intramuscular   Inject 1 mg into the muscle as needed.         . fish oil-omega-3 fatty acids 1000 MG capsule   Oral   Take 2 g by mouth daily.         . fluticasone (FLONASE) 50 MCG/ACT nasal spray   Nasal   Place 50 sprays into the nose daily.         Marland Kitchen lactobacillus acidophilus (BACID) TABS tablet   Oral   Take 2 tablets by mouth 2 (two) times a week.         . Multiple Vitamin (MULTIVITAMIN) capsule   Oral   Take 1 capsule by mouth daily.         Marland Kitchen NEXIUM 40 MG capsule   Oral   Take 1 capsule by mouth daily.         . Olopatadine HCl (PATANASE) 0.6 % SOLN   Nasal   Place 1 puff into the nose as needed.          . simvastatin (ZOCOR) 10 MG tablet   Oral   Take 1 tablet by mouth daily.         . vitamin B-12 (CYANOCOBALAMIN) 1000 MCG tablet   Oral   Take 1,000 mcg by mouth daily.         . Vitamin Mixture (ESTER-C) 500-60 MG TABS   Oral   Take 1 capsule by mouth daily.          BP 113/76  Pulse 69  Temp(Src) 98.8 F (37.1 C) (Oral)  Resp 18  SpO2 97% Physical Exam  Nursing note and vitals reviewed. Constitutional: She is oriented to person, place, and time. She appears well-nourished.  Slightly uncomfortable   HENT:  Head: Normocephalic and atraumatic.  Mouth/Throat: Oropharynx is clear and moist.  Eyes: Conjunctivae are normal. Pupils are equal, round, and reactive to light.  Neck: Normal range of motion. Neck supple.  Cardiovascular: Normal rate, regular rhythm and normal heart sounds.   Pulmonary/Chest: Effort normal and breath sounds normal. No respiratory distress. She has no wheezes. She has no rales.  Abdominal: Soft. Bowel sounds are normal. She exhibits no distension. There is no tenderness. There is no rebound and no guarding.  Musculoskeletal: Normal range of motion.  Unable to range L hip. L leg slightly shortened and rotated. 2+ pulses. L knee nl ROM.   Neurological: She is alert and oriented to person, place, and time. No cranial nerve deficit. Coordination normal.  Skin: Skin is warm and dry.  Psychiatric: She has a normal mood and affect. Her behavior is normal. Judgment and thought content normal.    ED Course  Procedures (including critical care time) Labs Review Labs Reviewed  CBC WITH DIFFERENTIAL - Abnormal; Notable for the following:  WBC 13.5 (*)    Neutrophils Relative % 90 (*)    Neutro Abs 12.1 (*)    Lymphocytes Relative 7 (*)    All other components within normal limits  COMPREHENSIVE METABOLIC PANEL - Abnormal; Notable for the following:    GFR calc non Af Amer 65 (*)    GFR calc Af Amer 75 (*)    All other components within normal  limits  PROTIME-INR  TYPE AND SCREEN   Imaging Review Dg Chest 1 View  11/06/2013   CLINICAL DATA:  Preoperative evaluation  EXAM: CHEST - 1 VIEW  COMPARISON:  None.  FINDINGS: The heart size and mediastinal contours are within normal limits. Both lungs are clear. The visualized skeletal structures are unremarkable.  IMPRESSION: No active disease.   Electronically Signed   By: Salome Holmes M.D.   On: 11/06/2013 14:03   Dg Hip Complete Left  11/06/2013   CLINICAL DATA:  Left hip pain after fall.  EXAM: LEFT HIP - COMPLETE 2+ VIEW  COMPARISON:  None.  FINDINGS: Mildly displaced fracture is seen involving the proximal left femoral neck. Right hip appears normal. The left femoral head remains within the acetabulum.  IMPRESSION: Mildly displaced proximal left femoral neck fracture.   Electronically Signed   By: Roque Lias M.D.   On: 11/06/2013 13:36    EKG Interpretation    Date/Time:  Saturday November 06 2013 14:45:12 EST Ventricular Rate:  70 PR Interval:  197 QRS Duration: 85 QT Interval:  409 QTC Calculation: 441 R Axis:   78 Text Interpretation:  Sinus rhythm No significant change since last tracing Confirmed by YAO  MD, DAVID [phone]) on 11/06/2013 3:07:32 PM            MDM  No diagnosis found. Sarah [name] is a 67 y.o. female here with L hip pain s/p fall. Will get xray to r/o hip vs pelvic fracture.   2 PM Xray showed L femoral neck fracture. I called Dr. Hoy Register office who will evaluate patient. Requesting preop by medicine.   3:39 PM Labs unremarkable. Will admit to medicine for preop.    Richardean Canal, MD 11/06/13 1540

## 2013-11-07 ENCOUNTER — Encounter (HOSPITAL_COMMUNITY): Payer: Self-pay | Admitting: Anesthesiology

## 2013-11-07 ENCOUNTER — Encounter (HOSPITAL_COMMUNITY)
Admission: EM | Disposition: A | Discharge status: Home Health | Payer: Self-pay | Source: Home / Self Care | Attending: Internal Medicine

## 2013-11-07 ENCOUNTER — Inpatient Hospital Stay (HOSPITAL_COMMUNITY): Payer: Medicare Other | Admitting: Anesthesiology

## 2013-11-07 ENCOUNTER — Inpatient Hospital Stay: Admit: 2013-11-07 | Payer: Medicare Other | Admitting: Orthopaedic Surgery

## 2013-11-07 ENCOUNTER — Inpatient Hospital Stay (HOSPITAL_COMMUNITY): Payer: Medicare Other

## 2013-11-07 ENCOUNTER — Encounter (HOSPITAL_COMMUNITY): Payer: Medicare Other | Admitting: Anesthesiology

## 2013-11-07 DIAGNOSIS — E785 Hyperlipidemia, unspecified: Secondary | ICD-10-CM

## 2013-11-07 DIAGNOSIS — I10 Essential (primary) hypertension: Secondary | ICD-10-CM

## 2013-11-07 HISTORY — PX: HIP ARTHROPLASTY: SHX981

## 2013-11-07 LAB — URINALYSIS, ROUTINE W REFLEX MICROSCOPIC
Hgb urine dipstick: NEGATIVE
Ketones, ur: NEGATIVE mg/dL
Nitrite: NEGATIVE
Protein, ur: NEGATIVE mg/dL
pH: 5.5 (ref 5.0–8.0)

## 2013-11-07 LAB — BASIC METABOLIC PANEL
BUN: 15 mg/dL (ref 6–23)
Calcium: 8.5 mg/dL (ref 8.4–10.5)
GFR calc Af Amer: 77 mL/min — ABNORMAL LOW (ref >90)
GFR calc non Af Amer: 66 mL/min — ABNORMAL LOW (ref >90)
Glucose, Bld: 93 mg/dL (ref 70–99)
Sodium: 143 mEq/L (ref 135–145)

## 2013-11-07 LAB — CBC
HCT: 33.1 % — ABNORMAL LOW (ref 36.0–46.0)
Hemoglobin: 11.4 g/dL — ABNORMAL LOW (ref 12.0–15.0)
MCH: 30.6 pg (ref 26.0–34.0)
MCHC: 34.4 g/dL (ref 30.0–36.0)
MCV: 89 fL (ref 78.0–100.0)
RBC: 3.72 MIL/uL — ABNORMAL LOW (ref 3.87–5.11)
RDW: 13.5 % (ref 11.5–15.5)

## 2013-11-07 LAB — CREATININE, SERUM
Creatinine, Ser: 0.89 mg/dL (ref 0.50–1.10)
GFR calc Af Amer: 76 mL/min — ABNORMAL LOW (ref >90)
GFR calc non Af Amer: 66 mL/min — ABNORMAL LOW (ref >90)

## 2013-11-07 LAB — URINE MICROSCOPIC-ADD ON

## 2013-11-07 SURGERY — HEMIARTHROPLASTY, HIP, DIRECT ANTERIOR APPROACH, FOR FRACTURE
Anesthesia: General | Site: Hip | Laterality: Left | Wound class: Clean

## 2013-11-07 MED ORDER — DEXAMETHASONE SODIUM PHOSPHATE 10 MG/ML IJ SOLN
INTRAMUSCULAR | Status: DC | PRN
  Administered 2013-11-07: 12 mg via INTRAVENOUS

## 2013-11-07 MED ORDER — ACETAMINOPHEN 10 MG/ML IV SOLN
1000.0000 mg | Freq: Four times a day (QID) | INTRAVENOUS | Status: AC
  Administered 2013-11-07 – 2013-11-08 (×4): 1000 mg via INTRAVENOUS
  Filled 2013-11-07 (×5): qty 100

## 2013-11-07 MED ORDER — SODIUM CHLORIDE 0.9 % IV SOLN
INTRAVENOUS | Status: DC
  Administered 2013-11-07: 75 mL/h via INTRAVENOUS
  Administered 2013-11-08 – 2013-11-09 (×2): via INTRAVENOUS

## 2013-11-07 MED ORDER — VANCOMYCIN HCL 1000 MG IV SOLR
1000.0000 mg | INTRAVENOUS | Status: DC | PRN
  Administered 2013-11-07: 1000 mg via INTRAVENOUS

## 2013-11-07 MED ORDER — KETOROLAC TROMETHAMINE 15 MG/ML IJ SOLN: 15.0000 mg | Freq: Four times a day (QID) | INTRAMUSCULAR | Status: DC

## 2013-11-07 MED ORDER — ONDANSETRON HCL 4 MG/2ML IJ SOLN: 4.0000 mg | Freq: Four times a day (QID) | INTRAMUSCULAR | Status: DC | PRN

## 2013-11-07 MED ORDER — LIDOCAINE HCL (CARDIAC) 20 MG/ML IV SOLN
INTRAVENOUS | Status: DC | PRN
  Administered 2013-11-07: 75 mg via INTRAVENOUS

## 2013-11-07 MED ORDER — METOCLOPRAMIDE HCL 10 MG PO TABS: 5.0000 mg | ORAL_TABLET | Freq: Three times a day (TID) | ORAL | Status: DC | PRN

## 2013-11-07 MED ORDER — SCOPOLAMINE 1 MG/3DAYS TD PT72
MEDICATED_PATCH | TRANSDERMAL | Status: DC | PRN
  Administered 2013-11-07: 1 via TRANSDERMAL

## 2013-11-07 MED ORDER — METHOCARBAMOL 100 MG/ML IJ SOLN
500.0000 mg | Freq: Four times a day (QID) | INTRAVENOUS | Status: DC | PRN
  Filled 2013-11-07: qty 5

## 2013-11-07 MED ORDER — SCOPOLAMINE 1 MG/3DAYS TD PT72
MEDICATED_PATCH | TRANSDERMAL | Status: AC
  Filled 2013-11-07: qty 1

## 2013-11-07 MED ORDER — ONDANSETRON HCL 4 MG PO TABS: 4.0000 mg | ORAL_TABLET | Freq: Four times a day (QID) | ORAL | Status: DC | PRN

## 2013-11-07 MED ORDER — BUPIVACAINE-EPINEPHRINE (PF) 0.5% -1:200000 IJ SOLN
INTRAMUSCULAR | Status: AC
  Filled 2013-11-07: qty 10

## 2013-11-07 MED ORDER — ENOXAPARIN SODIUM 40 MG/0.4ML ~~LOC~~ SOLN
40.0000 mg | SUBCUTANEOUS | Status: DC
  Administered 2013-11-08 – 2013-11-09 (×2): 40 mg via SUBCUTANEOUS
  Filled 2013-11-07 (×3): qty 0.4

## 2013-11-07 MED ORDER — ACETAMINOPHEN 10 MG/ML IV SOLN
1000.0000 mg | Freq: Once | INTRAVENOUS | Status: AC
  Filled 2013-11-07: qty 100

## 2013-11-07 MED ORDER — BUPIVACAINE-EPINEPHRINE 0.5% -1:200000 IJ SOLN
INTRAMUSCULAR | Status: DC | PRN
  Administered 2013-11-07: 18 mL
  Administered 2013-11-07: 8 mL

## 2013-11-07 MED ORDER — BISACODYL 5 MG PO TBEC: 5.0000 mg | DELAYED_RELEASE_TABLET | Freq: Every day | ORAL | Status: DC | PRN

## 2013-11-07 MED ORDER — GLYCOPYRROLATE 0.2 MG/ML IJ SOLN
INTRAMUSCULAR | Status: DC | PRN
  Administered 2013-11-07: 0.6 mg via INTRAVENOUS

## 2013-11-07 MED ORDER — SCOPOLAMINE 1 MG/3DAYS TD PT72
1.0000 | MEDICATED_PATCH | Freq: Once | TRANSDERMAL | Status: DC
  Filled 2013-11-07: qty 1

## 2013-11-07 MED ORDER — OXYCODONE HCL 5 MG PO TABS: 5.0000 mg | ORAL_TABLET | Freq: Once | ORAL | Status: DC | PRN

## 2013-11-07 MED ORDER — ROCURONIUM BROMIDE 100 MG/10ML IV SOLN
INTRAVENOUS | Status: DC | PRN
  Administered 2013-11-07: 50 mg via INTRAVENOUS

## 2013-11-07 MED ORDER — ARTIFICIAL TEARS OP OINT
TOPICAL_OINTMENT | OPHTHALMIC | Status: DC | PRN
  Administered 2013-11-07: 1 via OPHTHALMIC

## 2013-11-07 MED ORDER — PHENOL 1.4 % MT LIQD: 1.0000 | OROMUCOSAL | Status: DC | PRN

## 2013-11-07 MED ORDER — VANCOMYCIN HCL IN DEXTROSE 1-5 GM/200ML-% IV SOLN
1000.0000 mg | Freq: Two times a day (BID) | INTRAVENOUS | Status: AC
  Administered 2013-11-07: 1000 mg via INTRAVENOUS
  Filled 2013-11-07: qty 200

## 2013-11-07 MED ORDER — OXYCODONE HCL 5 MG/5ML PO SOLN: 5.0000 mg | Freq: Once | ORAL | Status: DC | PRN

## 2013-11-07 MED ORDER — LACTATED RINGERS IV SOLN
INTRAVENOUS | Status: DC | PRN
  Administered 2013-11-07 (×2): via INTRAVENOUS

## 2013-11-07 MED ORDER — ONDANSETRON HCL 4 MG/2ML IJ SOLN
INTRAMUSCULAR | Status: DC | PRN
  Administered 2013-11-07 (×2): 4 mg via INTRAVENOUS

## 2013-11-07 MED ORDER — HYDROMORPHONE HCL PF 1 MG/ML IJ SOLN: 0.1000 mg | INTRAMUSCULAR | Status: DC | PRN

## 2013-11-07 MED ORDER — OXYCODONE HCL 5 MG PO TABS: 5.0000 mg | ORAL_TABLET | ORAL | Status: DC | PRN

## 2013-11-07 MED ORDER — NEOSTIGMINE METHYLSULFATE 1 MG/ML IJ SOLN
INTRAMUSCULAR | Status: DC | PRN
  Administered 2013-11-07: 4 mg via INTRAVENOUS

## 2013-11-07 MED ORDER — SODIUM CHLORIDE 0.9 % IR SOLN
Status: DC | PRN
  Administered 2013-11-07: 1000 mL

## 2013-11-07 MED ORDER — METOCLOPRAMIDE HCL 5 MG/ML IJ SOLN: 5.0000 mg | Freq: Three times a day (TID) | INTRAMUSCULAR | Status: DC | PRN

## 2013-11-07 MED ORDER — MENTHOL 3 MG MT LOZG: 1.0000 | LOZENGE | OROMUCOSAL | Status: DC | PRN

## 2013-11-07 MED ORDER — MIDAZOLAM HCL 5 MG/5ML IJ SOLN
INTRAMUSCULAR | Status: DC | PRN
  Administered 2013-11-07: 2 mg via INTRAVENOUS

## 2013-11-07 MED ORDER — METHOCARBAMOL 500 MG PO TABS: 500.0000 mg | ORAL_TABLET | Freq: Four times a day (QID) | ORAL | Status: DC | PRN

## 2013-11-07 MED ORDER — ALBUMIN HUMAN 5 % IV SOLN
INTRAVENOUS | Status: DC | PRN
  Administered 2013-11-07: 14:00:00 via INTRAVENOUS

## 2013-11-07 MED ORDER — ACETAMINOPHEN 10 MG/ML IV SOLN
INTRAVENOUS | Status: DC | PRN
  Administered 2013-11-07: 1000 mg via INTRAVENOUS

## 2013-11-07 MED ORDER — PROPOFOL 10 MG/ML IV BOLUS
INTRAVENOUS | Status: DC | PRN
  Administered 2013-11-07: 150 mg via INTRAVENOUS

## 2013-11-07 SURGICAL SUPPLY — 53 items
BLADE SAW SAG 73X25 THK (BLADE) ×1
BLADE SAW SGTL 73X25 THK (BLADE) ×1 IMPLANT
CAPT HIP HD POR BIPOL/UNIPOL ×2 IMPLANT
CLOTH BEACON ORANGE TIMEOUT ST (SAFETY) ×2 IMPLANT
COVER BACK TABLE 24X17X13 BIG (DRAPES) IMPLANT
COVER SURGICAL LIGHT HANDLE (MISCELLANEOUS) ×2 IMPLANT
DECANTER SPIKE VIAL GLASS SM (MISCELLANEOUS) ×2 IMPLANT
DRAPE INCISE IOBAN 66X45 STRL (DRAPES) IMPLANT
DRAPE ORTHO SPLIT 77X108 STRL (DRAPES) ×2
DRAPE SURG ORHT 6 SPLT 77X108 (DRAPES) ×2 IMPLANT
DRSG ADAPTIC 3X8 NADH LF (GAUZE/BANDAGES/DRESSINGS) ×2 IMPLANT
DRSG MEPILEX BORDER 4X12 (GAUZE/BANDAGES/DRESSINGS) ×2 IMPLANT
DRSG MEPILEX BORDER 4X8 (GAUZE/BANDAGES/DRESSINGS) ×2 IMPLANT
DURAPREP 26ML APPLICATOR (WOUND CARE) ×2 IMPLANT
ELECT BLADE 6.5 EXT (BLADE) ×2 IMPLANT
ELECT REM PT RETURN 9FT ADLT (ELECTROSURGICAL) ×2
ELECTRODE REM PT RTRN 9FT ADLT (ELECTROSURGICAL) ×1 IMPLANT
EVACUATOR 1/8 PVC DRAIN (DRAIN) IMPLANT
FACESHIELD LNG OPTICON STERILE (SAFETY) ×6 IMPLANT
GLOVE BIO SURGEON STRL SZ8 (GLOVE) IMPLANT
GLOVE BIOGEL PI IND STRL 8 (GLOVE) ×1 IMPLANT
GLOVE BIOGEL PI IND STRL 8.5 (GLOVE) IMPLANT
GLOVE BIOGEL PI INDICATOR 8 (GLOVE) ×1
GLOVE BIOGEL PI INDICATOR 8.5 (GLOVE)
GLOVE ECLIPSE 8.0 STRL XLNG CF (GLOVE) ×2 IMPLANT
GOWN PREVENTION PLUS XLARGE (GOWN DISPOSABLE) ×2 IMPLANT
GOWN STRL NON-REIN LRG LVL3 (GOWN DISPOSABLE) ×4 IMPLANT
IMMOBILIZER KNEE 20 (SOFTGOODS) ×2
IMMOBILIZER KNEE 20 THIGH 36 (SOFTGOODS) ×1 IMPLANT
IMMOBILIZER KNEE 22 UNIV (SOFTGOODS) ×2 IMPLANT
IMMOBILIZER KNEE 24 THIGH 36 (MISCELLANEOUS) IMPLANT
IMMOBILIZER KNEE 24 UNIV (MISCELLANEOUS)
KIT BASIN OR (CUSTOM PROCEDURE TRAY) ×2 IMPLANT
KIT ROOM TURNOVER OR (KITS) ×2 IMPLANT
MANIFOLD NEPTUNE II (INSTRUMENTS) ×2 IMPLANT
NDL SUT .5 MAYO 1.404X.05X (NEEDLE) ×1 IMPLANT
NEEDLE MAYO TAPER (NEEDLE) ×1
NS IRRIG 1000ML POUR BTL (IV SOLUTION) ×2 IMPLANT
PACK TOTAL JOINT (CUSTOM PROCEDURE TRAY) ×2 IMPLANT
PAD ARMBOARD 7.5X6 YLW CONV (MISCELLANEOUS) ×4 IMPLANT
PASSER SUT SWANSON 36MM LOOP (INSTRUMENTS) ×2 IMPLANT
STAPLER VISISTAT 35W (STAPLE) ×2 IMPLANT
SUCTION FRAZIER TIP 10 FR DISP (SUCTIONS) ×2 IMPLANT
SUT ETHIBOND CT1 BRD #0 30IN (SUTURE) ×4 IMPLANT
SUT ETHIBOND NAB CT1 #1 30IN (SUTURE) ×8 IMPLANT
SUT VIC AB 0 CT1 27 (SUTURE) ×3
SUT VIC AB 0 CT1 27XBRD ANBCTR (SUTURE) ×3 IMPLANT
SUT VIC AB 2-0 CTB1 (SUTURE) IMPLANT
SYR 20CC LL (SYRINGE) ×2 IMPLANT
TOWEL OR 17X24 6PK STRL BLUE (TOWEL DISPOSABLE) ×2 IMPLANT
TOWEL OR 17X26 10 PK STRL BLUE (TOWEL DISPOSABLE) ×2 IMPLANT
TRAY FOLEY CATH 16FRSI W/METER (SET/KITS/TRAYS/PACK) ×2 IMPLANT
WATER STERILE IRR 1000ML POUR (IV SOLUTION) ×6 IMPLANT

## 2013-11-07 NOTE — Anesthesia Preprocedure Evaluation (Signed)
Anesthesia Evaluation  Patient identified by MRN, date of birth, ID band Patient awake    Reviewed: Allergy & Precautions, H&P , NPO status , Patient's Chart, lab work & pertinent test results  Airway Mallampati: II  Neck ROM: full    Dental   Pulmonary neg pulmonary ROS,          Cardiovascular hypertension,     Neuro/Psych    GI/Hepatic   Endo/Other    Renal/GU      Musculoskeletal   Abdominal   Peds  Hematology   Anesthesia Other Findings   Reproductive/Obstetrics                          Anesthesia Physical Anesthesia Plan  ASA: II  Anesthesia Plan: General   Post-op Pain Management:    Induction: Intravenous  Airway Management Planned: Oral ETT  Additional Equipment:   Intra-op Plan:   Post-operative Plan: Extubation in OR  Informed Consent: I have reviewed the patients History and Physical, chart, labs and discussed the procedure including the risks, benefits and alternatives for the proposed anesthesia with the patient or authorized representative who has indicated his/her understanding and acceptance.     Plan Discussed with: CRNA, Anesthesiologist and Surgeon  Anesthesia Plan Comments:         Anesthesia Quick Evaluation  

## 2013-11-07 NOTE — Progress Notes (Signed)
[facility] PROGRESS NOTE  Sarah [name] AOZ:[id number] DOB: 11-17-1946 DOA: 11/06/2013 PCP: Lilia Argue  Assessment/Plan: 67 y.o. female has a past medical history significant for HTN, HLD, seasonal allergies, comes in with left hip pain after falling at home found L hip fracture  1. Fall mechanical -->L hip fracture; OR today; management per orthopedics   2. HTN hold ARB preop; resume home meds post op  3. HPL cont statin   4. Mild leukocytosis likely stress induced; no s/s of infection; check UA  Code Status: full Family Communication: husband at the bedside (indicate person spoken with, relationship, and if by phone, the number) Disposition Plan: when cleared by ortho   Consultants:  ortho  Procedures:  L hip   Antibiotics:  None  (indicate start date, and stop date if known)  HPI/Subjective: alert  Objective: Filed Vitals:   11/07/13 0539  BP: 129/63  Pulse: 66  Temp: 99.2 F (37.3 C)  Resp: 16    Intake/Output Summary (Last 24 hours) at 11/07/13 0819 Last data filed at 11/06/13 1700  Gross per 24 hour  Intake    240 ml  Output      0 ml  Net    240 ml   There were no vitals filed for this visit.  Exam:   General:  alert  Cardiovascular: s1,s2 rrr  Respiratory: CTA BL  Abdomen: soft, nt, nd   Musculoskeletal: no LE edema    Data Reviewed: Basic Metabolic Panel:  Recent Labs Lab 11/06/13 1450 11/07/13 0520  NA 141 143  K 3.7 3.6  CL 105 110  CO2 23 26  GLUCOSE 99 93  BUN 17 15  CREATININE 0.90 0.88  CALCIUM 9.0 8.5   Liver Function Tests:  Recent Labs Lab 11/06/13 1450  AST 28  ALT 28  ALKPHOS 70  BILITOT 0.3  PROT 6.7  ALBUMIN 3.6   No results found for this basename: LIPASE, AMYLASE,  in the last 168 hours No results found for this basename: AMMONIA,  in the last 168 hours CBC:  Recent Labs Lab 11/06/13 1450  WBC 13.5*  NEUTROABS 12.1*  HGB 13.0  HCT 36.4  MCV 88.1  PLT 152   Cardiac  Enzymes: No results found for this basename: CKTOTAL, CKMB, CKMBINDEX, TROPONINI,  in the last 168 hours BNP (last 3 results) No results found for this basename: PROBNP,  in the last 8760 hours CBG: No results found for this basename: GLUCAP,  in the last 168 hours  No results found for this or any previous visit (from the past 240 hour(s)).   Studies: Dg Chest 1 View  11/06/2013   CLINICAL DATA:  Preoperative evaluation  EXAM: CHEST - 1 VIEW  COMPARISON:  None.  FINDINGS: The heart size and mediastinal contours are within normal limits. Both lungs are clear. The visualized skeletal structures are unremarkable.  IMPRESSION: No active disease.   Electronically Signed   By: Salome Holmes M.D.   On: 11/06/2013 14:03   Dg Hip Complete Left  11/06/2013   CLINICAL DATA:  Left hip pain after fall.  EXAM: LEFT HIP - COMPLETE 2+ VIEW  COMPARISON:  None.  FINDINGS: Mildly displaced fracture is seen involving the proximal left femoral neck. Right hip appears normal. The left femoral head remains within the acetabulum.  IMPRESSION: Mildly displaced proximal left femoral neck fracture.   Electronically Signed   By: Roque Lias M.D.   On: 11/06/2013 13:36    Scheduled Meds: .  beta carotene w/minerals  1 tablet Oral Daily  . calcium carbonate  1 tablet Oral BID WC  . cholecalciferol  400 Units Oral Daily  . docusate sodium  100 mg Oral BID  . fluticasone  1 spray Each Nare Daily  . heparin  5,000 Units Subcutaneous Once  . [START ON 11/08/2013] irbesartan  75 mg Oral Daily  . [START ON 11/08/2013] lactobacillus acidophilus  2 tablet Oral 2 times weekly  . loratadine  10 mg Oral Daily  . pantoprazole  40 mg Oral Daily   Continuous Infusions: . sodium chloride 75 mL/hr at 11/06/13 1846    Active Problems:   Hyperlipidemia   Hypertension   Hip fracture, left   Environmental allergies    Time spent:>35 minutes    Esperanza Sheets  [facility] Pager [phone]. If 7PM-7AM, please  contact night-coverage at [email or web address], password [facility] 11/07/2013, 8:19 AM  LOS: 1 day

## 2013-11-07 NOTE — Anesthesia Postprocedure Evaluation (Signed)
  Anesthesia Post-op Note  Patient: Sarah [name]  Procedure(s) Performed: Procedure(s): ARTHROPLASTY BIPOLAR HIP (Left)  Patient Location: PACU  Anesthesia Type:General  Level of Consciousness: awake  Airway and Oxygen Therapy: Patient Spontanous Breathing  Post-op Pain: mild  Post-op Assessment: Post-op Vital signs reviewed  Post-op Vital Signs: Reviewed  Complications: No apparent anesthesia complications

## 2013-11-07 NOTE — Preoperative (Signed)
Beta Blockers   Reason not to administer Beta Blockers:Not Applicable 

## 2013-11-07 NOTE — Anesthesia Procedure Notes (Signed)
Procedure Name: Intubation Date/Time: 11/07/2013 1:17 PM Performed by: Esperanza Sheets Pre-anesthesia Checklist: Patient identified, Emergency Drugs available, Suction available, Patient being monitored and Timeout performed Patient Re-evaluated:Patient Re-evaluated prior to inductionOxygen Delivery Method: Circle system utilized Preoxygenation: Pre-oxygenation with 100% oxygen Intubation Type: IV induction Laryngoscope Size: Miller and 2 Grade View: Grade I Tube type: Oral Number of attempts: 1 Airway Equipment and Method: Stylet Placement Confirmation: ETT inserted through vocal cords under direct vision,  positive ETCO2 and breath sounds checked- equal and bilateral Secured at: 22 cm Tube secured with: Tape Dental Injury: Teeth and Oropharynx as per pre-operative assessment

## 2013-11-07 NOTE — Op Note (Signed)
PATIENT ID:      DUA MEHLER  MRN:     [id number] DOB/AGE:    1946/01/08 / 67 y.o.       OPERATIVE REPORT    DATE OF PROCEDURE:  11/07/2013       PREOPERATIVE DIAGNOSIS:   Left Proximal Femoral Neck Fracture-displaced                                                       Estimated body mass index is 25.70 kg/(m^2) as calculated from the following:   Height as of 09/09/13: 5' 3.75" (1.619 m).   Weight as of 10/21/13: 67.359 kg (148 lb 8 oz).     POSTOPERATIVE DIAGNOSIS:   Left Proximal Femoral Neck Fracture                                                                     Estimated body mass index is 25.70 kg/(m^2) as calculated from the following:   Height as of 09/09/13: 5' 3.75" (1.619 m).   Weight as of 10/21/13: 67.359 kg (148 lb 8 oz).     PROCEDURE:  Procedure(s): ARTHROPLASTY BIPOLAR HIP left     SURGEON:  Norlene Campbell, MD    ASSISTANT:   Jacqualine Code, PA-C   (Present and scrubbed throughout the case, critical for assistance with exposure, retraction, instrumentation, and closure.)          ANESTHESIA: general     DRAINS: none :      TOURNIQUET TIME: * No tourniquets in log *    COMPLICATIONS:  None   CONDITION:  stable  PROCEDURE IN DETAIL: [id number]   Tyquavious Gamel W 11/07/2013, 2:25 PM

## 2013-11-07 NOTE — Transfer of Care (Signed)
Immediate Anesthesia Transfer of Care Note  Patient: Sarah [name]  Procedure(s) Performed: Procedure(s): ARTHROPLASTY BIPOLAR HIP (Left)  Patient Location: PACU  Anesthesia Type:General  Level of Consciousness: awake, alert , oriented and sedated  Airway & Oxygen Therapy: Patient Spontanous Breathing and Patient connected to nasal cannula oxygen  Post-op Assessment: Report given to PACU RN, Post -op Vital signs reviewed and stable and Patient moving all extremities  Post vital signs: Reviewed and stable  Complications: No apparent anesthesia complications

## 2013-11-07 NOTE — Op Note (Signed)
NAMEMarland Kitchen  Sarah [name], Sarah [name] NO.:  [id number]  MEDICAL RECORD NO.:  [id number]  LOCATION:  [facility]                         FACILITY:  [facility]  PHYSICIAN:  Claude Manges. Decarla Siemen, M.D.DATE OF BIRTH:  1946/01/26  DATE OF PROCEDURE:  11/07/2013 DATE OF DISCHARGE:                              OPERATIVE REPORT   PREOPERATIVE DIAGNOSIS:  Displaced left femoral neck fracture.  POSTOPERATIVE DIAGNOSIS:  Displaced left femoral neck fracture.  PROCEDURE:  Bipolar arthroplasty left hip.  SURGEON:  Claude Manges. Cleophas Dunker, M.D.  ASSISTANT:  Arlys John D. Petrarca, P.A.-C.  ANESTHESIA:  General.  COMPLICATIONS:  None.  COMPONENTS:  DePuy AML 12.5 mm small stature femoral stem, a 28-mm metallic head inside a 46 mm outer diameter metallic bipolar hip ball, components were press-fit.  COMPLICATIONS:  None.  DESCRIPTION OF PROCEDURE:  Sarah [name] was met in the holding area with her husband.  I identified the left hip was appropriate operative site and marked it accordingly.  Any questions were answered.  The patient was then transported to room #7 and placed under general anesthesia without difficulty.  The patient is prone to nausea and accordingly a transderm patch was applied.  Nursing staff inserted a Foley catheter.  Urine was clear.  The patient was then placed in the lateral decubitus position with the left side up and secured to the operating table with the innominate hip system.  The left hip was then prepped from the iliac crest to below the midcalf with Betadine scrub and with chlorhexidine scrub and then DuraPrep. Sterile draping was performed.  Time-out was called.  Routine Southern incision was made from the greater trochanter extending posteriorly via sharp dissection, carried down to subcutaneous tissue. Small bleeders were Bovie coagulated.  Incision was carried down to the iliotibial band which was carefully incised along the length of the skin incision.   Self-retaining retractors were inserted.  The raphe in the muscle fibers were identified and carefully and bluntly separated self retraining retractors were placed more deeply.  Adipose tissue overlying the short external rotators were identified and carefully incised. Short external rotators were visualized and vascular structures were incised.  Tendinous structures were tagged with 0 Ethibond suture. Capsule was identified and incised along the femoral neck and head to the level of the acetabulum with a bloody effusion.  I was able to palpate the sciatic nerve and actually visualize it more proximally, it appeared that had an aberrant course and throughout the procedure was carefully protected.  The hip was then dislocated placing a towel clip on the head, so that it came out with it.  It was dislocated on the femoral neck.  An oscillating saw was used to complete the osteotomy, it was obvious that the head had been fractured in the subcapital area.  The acetabulum was inspected with some loose pieces of bone that were removed.  A starter hole was then made in the piriformis fossa and hand reaming was performed to 10-mm and then the prior reamers were used for 12 mm. I thought the neck was a little long and removed nearly a centimeter of the neck using the calcar guide and the femoral rasps.  Rasping  was then performed sequentially to 12.5  mm small stature and rasped it firmly on the neck.  This was removed.  The wound was irrigated.  I again inspected the acetabulum.  I measured the femoral head at 46 mm and trialed several sizes and felt that the 46 mm outer diameter head was a perfect fit.  The 12.0 small stature femoral rasp was then re-impacted, flushed on the calcar and then the bipolar assembly with a 28 mm inside head and the 46 mm outer diameter metallic head was then applied to the stem to the rasp.  This was carefully inserted again protecting the femoral nerve through  a full range of motion.  I had excellent stability.  I initially tried a 5 mm and then an 8.5 mm neck felt like the leg lengths remained symmetrical with an 8.5 and had excellent stability.  There was not tight in extension or in flexion and there was no evidence of subluxation.  Trial components were removed.  The joint was then copiously irrigated with saline solution.  The final AML small stature 12 mm femoral component was then impacted flush on the calcar.  Wound was again irrigated.  Checked the acetabulum by finger palpation without any evidence of a loose material.  The bipolar construct with a 46 mm outer diameter hip ball was then applied to the [facility] taper neck, impacted in placed, and then carefully reduced again protecting the sciatic nerve. Through a full range of motion, the assembly was perfectly intact.  Wound was again irrigated with saline solution.  I infiltrated the deep capsule with 0.25% Marcaine without epinephrine, and then closed anatomically with #1 Ethibond.  Short external rotators were closed with similar material and also injected with Marcaine without epinephrine. The iliotibial band was closed with a running #1 Vicryl.  It was also infiltrated with 0.25% Marcaine without epinephrine.  The deep capsule was closed with 2-0 Vicryl, 3-0 Monocryl, and then skin clips.  Sterile bulky dressing was applied followed by Mepilex dressing.  The patient tolerated the procedure without complications and returned to the postanesthesia recovery room in satisfactory condition.     Claude Manges. Cleophas Dunker, M.D.     PWW/MEDQ  D:  11/07/2013  T:  11/07/2013  Job:  [id number]

## 2013-11-07 NOTE — H&P (Signed)
  The recent History & Physical has been reviewed. I have personally examined the patient today. There is no interval change to the documented History & Physical. The patient would like to proceed with the procedure.  Norlene Campbell W 11/07/2013,  12:43 PM

## 2013-11-08 LAB — BASIC METABOLIC PANEL
CO2: 24 mEq/L (ref 19–32)
Calcium: 8.2 mg/dL — ABNORMAL LOW (ref 8.4–10.5)
Creatinine, Ser: 0.96 mg/dL (ref 0.50–1.10)
GFR calc non Af Amer: 60 mL/min — ABNORMAL LOW (ref >90)
Glucose, Bld: 121 mg/dL — ABNORMAL HIGH (ref 70–99)
Sodium: 142 mEq/L (ref 135–145)

## 2013-11-08 LAB — CBC
Hemoglobin: 10.5 g/dL — ABNORMAL LOW (ref 12.0–15.0)
MCH: 30.9 pg (ref 26.0–34.0)
MCHC: 34.7 g/dL (ref 30.0–36.0)
MCV: 89.1 fL (ref 78.0–100.0)
RBC: 3.4 MIL/uL — ABNORMAL LOW (ref 3.87–5.11)
WBC: 10.4 10*3/uL (ref 4.0–10.5)

## 2013-11-08 MED ORDER — IRBESARTAN 75 MG PO TABS
75.0000 mg | ORAL_TABLET | Freq: Every day | ORAL | Status: DC
  Administered 2013-11-08 – 2013-11-09 (×2): 75 mg via ORAL
  Filled 2013-11-08 (×2): qty 1

## 2013-11-08 NOTE — Progress Notes (Signed)
Physical Therapy Treatment Patient Details Name: Sarah [name] MRN: [id number] DOB: [birth year] Today's Date: 11/08/2013 Time: 4098-1191 PT Time Calculation (min): 45 min  PT Assessment / Plan / Recommendation  History of Present Illness Sarah [name] is a 67 y.o. female has a past medical history significant for HTN, HLD, seasonal allergies, comes in with left hip pain after falling at home. This was a mechanical fall as she slipped on tiles. Now s/p bipolar arthroplasty   PT Comments   Making excellent progress; Will plan on stair training tomorrow; On track for dc tomorrow from PT standpoint  Follow Up Recommendations  Home health PT;Supervision/Assistance - 24 hour     Does the patient have the potential to tolerate intense rehabilitation     Barriers to Discharge        Equipment Recommendations  Rolling walker with 5" wheels;3in1 (PT)    Recommendations for Other Services OT consult  Frequency Min 6X/week   Progress towards PT Goals    Plan      Precautions / Restrictions Precautions Precautions: Posterior Hip Precaution Booklet Issued: Yes (comment) Precaution Comments: Pt able to recall 3/3 hip precautions Restrictions Weight Bearing Restrictions: Yes Other Position/Activity Restrictions: WBAT   Pertinent Vitals/Pain 4/10 L hip and groin with therex;  patient repositioned for comfort     Mobility  Bed Mobility Bed Mobility: Supine to Sit;Sitting - Scoot to Edge of Bed Supine to Sit: With rails;4: Min assist Sitting - Scoot to Edge of Bed: 4: Min assist;With rail Details for Bed Mobility Assistance: cues for technique and post hip prec Transfers Transfers: Sit to Stand;Stand to Sit Sit to Stand: From bed;With upper extremity assist;4: Min guard Stand to Sit: To chair/3-in-1;With upper extremity assist;4: Min guard Details for Transfer Assistance: Cues for post hip prec and prepositioning for post hip prec; good rise without need for physical  assist Ambulation/Gait Ambulation/Gait Assistance: 4: Min guard Ambulation Distance (Feet): 30 Feet Assistive device: Rolling walker Ambulation/Gait Assistance Details: Cues for gait sequence and ip prec with turns Gait Pattern: Step-to pattern;Antalgic Stairs:  Demo cues for sequence and technique with pt and husband; pt opted to practice steps tomorrow   Exercises Total Joint Exercises Ankle Circles/Pumps: AROM;Both;20 reps Quad Sets: AROM;Left;10 reps Gluteal Sets: AROM;Both;10 reps Towel Squeeze: AROM;Left;10 reps Heel Slides: AAROM;AROM;Left;10 reps Hip ABduction/ADduction: AAROM;Left;10 reps Bridges: AROM;Both;10 reps   PT Diagnosis: Difficulty walking;Acute pain  PT Problem List: Decreased strength;Decreased range of motion;Decreased activity tolerance;Decreased balance;Decreased mobility;Decreased knowledge of use of DME;Decreased knowledge of precautions;Pain PT Treatment Interventions: DME instruction;Gait training;Stair training;Functional mobility training;Therapeutic activities;Therapeutic exercise;Patient/family education   PT Goals (current goals can now be found in the care plan section) Acute Rehab PT Goals Patient Stated Goal: Home ASAP PT Goal Formulation: With patient Time For Goal Achievement: 11/15/13 Potential to Achieve Goals: Good  Visit Information  Last PT Received On: 11/08/13 Assistance Needed: +1 History of Present Illness: Sarah [name] is a 67 y.o. female has a past medical history significant for HTN, HLD, seasonal allergies, comes in with left hip pain after falling at home. This was a mechanical fall as she slipped on tiles. Now s/p bipolar arthroplasty    Subjective Data  Subjective: Still wanting to avoid narcotics Patient Stated Goal: Home ASAP   Cognition  Cognition Arousal/Alertness: Awake/alert Behavior During Therapy: WFL for tasks assessed/performed Overall Cognitive Status: Within Functional Limits for tasks assessed     Balance     End of Session PT - End of Session  Activity Tolerance: Patient tolerated treatment well Patient left: in chair;with call bell/phone within reach;with family/visitor present Nurse Communication: Mobility status;Precautions   GP     Van Clines [facility] [place], [place] [phone]  11/08/2013, 3:41 PM

## 2013-11-08 NOTE — Evaluation (Signed)
Occupational Therapy Evaluation and Discharge Patient Details Name: VONCILLE SIMM MRN: [id number] DOB: [birth year] Today's Date: 11/08/2013 Time: 1415-1430 OT Time Calculation (min): 15 min  OT Assessment / Plan / Recommendation History of present illness ILEA HILTON is a 67 y.o. female has a past medical history significant for HTN, HLD, seasonal allergies, comes in with left hip pain after falling at home. This was a mechanical fall as she slipped on tiles. Now s/p bipolar arthroplasty   Clinical Impression   This 67 yo female admitted with above presents to acute OT with all education completed, acute OT will sign off.   OT Assessment  Patient does not need any further OT services    Follow Up Recommendations  No OT follow up       Equipment Recommendations  None recommended by OT          Precautions / Restrictions Precautions Precautions: Posterior Hip Precaution Comments: Pt able to recall 3/3 hip precautions Restrictions Weight Bearing Restrictions: No Other Position/Activity Restrictions: WBAT       ADL  ADL Comments: Husband will A with LBADLs until pt can do them for herself; I did go over how she would use a reacher (which she already has) to doff socks and donn/doff underwear and pants. I also demonstrated how she would side step into and out of shower stall and then sit on seat (3n1). Offerred to have pt practice, she politely declined saying she felt she understood how to do it with just my demonstration. I also showed her how she could reach down to get items on a lower cabinet or potentially the floor by extending her LLE behind her before she bent forward and thus opening up the angle of hip to allow more bend and not "break" the 90 degree rule. No further OT needs.     Acute Rehab OT Goals Patient Stated Goal: Home ASAP  Visit Information  Last OT Received On: 11/08/13 Assistance Needed: +1 History of Present Illness: JONAE RENSHAW is a 67 y.o. female  has a past medical history significant for HTN, HLD, seasonal allergies, comes in with left hip pain after falling at home. This was a mechanical fall as she slipped on tiles. Now s/p bipolar arthroplasty       Prior Functioning     Home Living Family/patient expects to be discharged to:: Private residence Living Arrangements: Spouse/significant other Available Help at Discharge: Family;Available 24 hours/day Type of Home: House Home Access: Stairs to enter [employer] of Steps: 5 Entrance Stairs-Rails: Right Home Layout: One level Home Equipment: Walker - 2 wheels;Bedside commode Prior Function Level of Independence: Independent Communication Communication: No difficulties Dominant Hand: Right         Vision/Perception Vision - History Patient Visual Report: No change from baseline   Cognition  Cognition Arousal/Alertness: Awake/alert Behavior During Therapy: WFL for tasks assessed/performed Overall Cognitive Status: Within Functional Limits for tasks assessed    Extremity/Trunk Assessment Upper Extremity Assessment Upper Extremity Assessment: Overall WFL for tasks assessed              End of Session OT - End of Session Patient left: in bed;with call bell/phone within reach;with family/visitor present       Evette Georges [phone] 11/08/2013, 2:59 PM

## 2013-11-08 NOTE — Progress Notes (Signed)
Patient ID: Sarah [name], female   DOB: Apr 08, 1946, 67 y.o.   MRN: [id number] PATIENT ID: Sarah [name]        MRN:  [id number]          DOB/AGE: [birth year] / 67 y.o.    Sarah Campbell, MD   Jacqualine Code, PA-C [address] [place], [place]  [address]                             [phone]   PROGRESS NOTE  Subjective:  negative for Chest Pain  negative for Shortness of Breath  negative for Nausea/Vomiting   negative for Calf Pain    Tolerating Diet: yes         Patient reports pain as mild.     No narcotic use yet-comfortable with toradol and IV tylenol  Objective: Vital signs in last 24 hours:   Patient Vitals for the past 24 hrs:  BP Temp Temp src Pulse Resp SpO2 Height Weight  11/08/13 1143 - - - - 19 97 % - -  11/08/13 0900 - - - - - - 5\' 6"  (1.676 m) 67.359 kg (148 lb 8 oz)  11/08/13 0800 - - - - 19 97 % - -  11/08/13 0549 123/48 mmHg 98.3 F (36.8 C) - 65 19 97 % - -  11/08/13 0400 - - - - 19 97 % - -  11/08/13 0000 - - - - 19 97 % - -  11/07/13 2148 103/51 mmHg 97.6 F (36.4 C) Oral 62 18 95 % - -  11/07/13 2000 - - - - 19 97 % - -  11/07/13 1759 123/53 mmHg 98 F (36.7 C) Oral 70 - 94 % - -  11/07/13 1634 120/59 mmHg 98 F (36.7 C) Oral 58 22 93 % - -  11/07/13 1554 121/49 mmHg - - 55 21 93 % - -  11/07/13 1553 - 98 F (36.7 C) - - - - - -  11/07/13 1544 127/51 mmHg - - 55 20 94 % - -  11/07/13 1529 125/60 mmHg - - 72 23 98 % - -  11/07/13 1514 127/49 mmHg - - 66 16 100 % - -  11/07/13 1459 118/100 mmHg - - - 15 - - -  11/07/13 1457 - 97.8 F (36.6 C) - - - - - -      Intake/Output from previous day:   11/23 0701 - 11/24 0700 In: 2675 [I.V.:1975] Out: 1800 [Urine:1800]   Intake/Output this shift:   11/24 0701 - 11/24 1900 In: 600 [I.V.:600] Out: 300 [Urine:300]   Intake/Output     11/23 0701 - 11/24 0700 11/24 0701 - 11/25 0700   P.O. 0    I.V. (mL/kg) 1975 600 (8.9)   IV Piggyback 700    Total Intake(mL/kg) 2675 600 (8.9)   Urine  (mL/kg/hr) 1800 300 (0.9)   Total Output 1800 300   Net +875 +300        Urine Occurrence 1 x    Stool Occurrence 1 x       LABORATORY DATA:  Recent Labs  11/06/13 1450 11/07/13 1630 11/08/13 0253  WBC 13.5* 10.1 10.4  HGB 13.0 11.4* 10.5*  HCT 36.4 33.1* 30.3*  PLT 152 125* 136*    Recent Labs  11/06/13 1450 11/07/13 0520 11/07/13 1630 11/08/13 0253  NA 141 143  --  142  K 3.7 3.6  --  3.6  CL 105 110  --  108  CO2 23 26  --  24  BUN 17 15  --  9  CREATININE 0.90 0.88 0.89 0.96  GLUCOSE 99 93  --  121*  CALCIUM 9.0 8.5  --  8.2*   Lab Results  Component Value Date   INR 0.86 11/06/2013   INR 0.96 03/12/2010    Recent Radiographic Studies :  Dg Chest 1 View  11/06/2013   CLINICAL DATA:  Preoperative evaluation  EXAM: CHEST - 1 VIEW  COMPARISON:  None.  FINDINGS: The heart size and mediastinal contours are within normal limits. Both lungs are clear. The visualized skeletal structures are unremarkable.  IMPRESSION: No active disease.   Electronically Signed   By: Salome Holmes M.D.   On: 11/06/2013 14:03   Dg Hip Complete Left  11/06/2013   CLINICAL DATA:  Left hip pain after fall.  EXAM: LEFT HIP - COMPLETE 2+ VIEW  COMPARISON:  None.  FINDINGS: Mildly displaced fracture is seen involving the proximal left femoral neck. Right hip appears normal. The left femoral head remains within the acetabulum.  IMPRESSION: Mildly displaced proximal left femoral neck fracture.   Electronically Signed   By: Roque Lias M.D.   On: 11/06/2013 13:36   Dg Pelvis Portable  11/07/2013   CLINICAL DATA:  Fracture fixation  EXAM: PORTABLE PELVIS 1-2 VIEWS  COMPARISON:  11/06/2013.  FINDINGS: Left hip hemiarthroplasty in satisfactory position alignment. No acute complication.  IMPRESSION: Satisfactory left hip hemiarthroplasty.   Electronically Signed   By: Marlan Palau M.D.   On: 11/07/2013 15:42   Dg Hip Portable 1 View Left  11/07/2013   CLINICAL DATA:  Fracture fixation  EXAM:  PORTABLE LEFT HIP - 1 VIEW  COMPARISON:  11/06/2013  FINDINGS: Lateral views of the left hip reveal left hip hemiarthroplasty in satisfactory position and alignment. No acute complication.  IMPRESSION: Satisfactory left hip hemiarthroplasty.   Electronically Signed   By: Marlan Palau M.D.   On: 11/07/2013 15:42     Examination:  General appearance: alert, cooperative and no distress  Wound Exam: clean, dry, intact   Drainage:  None: wound tissue dry  Motor Exam: EHL, FHL, Anterior Tibial and Posterior Tibial Intact  Sensory Exam: Superficial Peroneal, Deep Peroneal and Tibial normal  Vascular Exam: Normal  Assessment:    1 Day Post-Op  Procedure(s) (LRB): ARTHROPLASTY BIPOLAR HIP (Left)  ADDITIONAL DIAGNOSIS:  Active Problems:   Hyperlipidemia   Hypertension   Hip fracture, left   Environmental allergies  no new problems   Plan: Physical Therapy as ordered Weight Bearing as Tolerated (WBAT)  DVT Prophylaxis:  Lovenox  DISCHARGE PLAN: Home  DISCHARGE NEEDS: HHPT, Walker and 3-in-1 comode seat   excellent post op course so far, continue with OOB and PT-post op films without any complications      [name] 11/08/2013 12:06 PM

## 2013-11-08 NOTE — Progress Notes (Signed)
UR completed. Alyxandria Wentz RN CCM Case Mgmt 

## 2013-11-08 NOTE — Progress Notes (Signed)
11/08/13 PT recommended HHPT. Spoke with patient and her husband about HHC. They chose [facility] from [employer]. Dava Najjar at [facility] and set up HHPT.Patient stated that she has a rolling walker and a 3N1 at home. No equipment needs identified. Will continue to follow for d/c needs. Jacquelynn Cree RN, BSN, CCM

## 2013-11-08 NOTE — Progress Notes (Signed)
[facility] PROGRESS NOTE  ARVADA SEABORN AVW:[id number] DOB: [birth year] DOA: 11/06/2013 PCP: Lilia Argue  Assessment/Plan: 67 y.o. female has a past medical history significant for HTN, HLD, seasonal allergies, comes in with left hip pain after falling at home found L hip fracture  1. Fall mechanical -->L hip fracture;  -s/p L hip arthroplasty; management per orthopedics   2. HTN hold ARB preop; resume home meds post op  3. HPL cont statin   4. Mild leukocytosis likely stress induced; no s/s of infection; UA: unremarkable;   Code Status: full Family Communication: husband at the bedside (indicate person spoken with, relationship, and if by phone, the number) Disposition Plan: when cleared by ortho   Consultants:  ortho  Procedures:  L hip   Antibiotics:  None  (indicate start date, and stop date if known)  HPI/Subjective: alert  Objective: Filed Vitals:   11/08/13 0800  BP:   Pulse:   Temp:   Resp: 19    Intake/Output Summary (Last 24 hours) at 11/08/13 1050 Last data filed at 11/08/13 0900  Gross per 24 hour  Intake   3275 ml  Output   1900 ml  Net   1375 ml   Filed Weights   11/08/13 0900  Weight: 67.359 kg (148 lb 8 oz)    Exam:   General:  alert  Cardiovascular: s1,s2 rrr  Respiratory: CTA BL  Abdomen: soft, nt, nd   Musculoskeletal: no LE edema    Data Reviewed: Basic Metabolic Panel:  Recent Labs Lab 11/06/13 1450 11/07/13 0520 11/07/13 1630 11/08/13 0253  NA 141 143  --  142  K 3.7 3.6  --  3.6  CL 105 110  --  108  CO2 23 26  --  24  GLUCOSE 99 93  --  121*  BUN 17 15  --  9  CREATININE 0.90 0.88 0.89 0.96  CALCIUM 9.0 8.5  --  8.2*   Liver Function Tests:  Recent Labs Lab 11/06/13 1450  AST 28  ALT 28  ALKPHOS 70  BILITOT 0.3  PROT 6.7  ALBUMIN 3.6   No results found for this basename: LIPASE, AMYLASE,  in the last 168 hours No results found for this basename: AMMONIA,  in the last 168  hours CBC:  Recent Labs Lab 11/06/13 1450 11/07/13 1630 11/08/13 0253  WBC 13.5* 10.1 10.4  NEUTROABS 12.1*  --   --   HGB 13.0 11.4* 10.5*  HCT 36.4 33.1* 30.3*  MCV 88.1 89.0 89.1  PLT 152 125* 136*   Cardiac Enzymes: No results found for this basename: CKTOTAL, CKMB, CKMBINDEX, TROPONINI,  in the last 168 hours BNP (last 3 results) No results found for this basename: PROBNP,  in the last 8760 hours CBG: No results found for this basename: GLUCAP,  in the last 168 hours  No results found for this or any previous visit (from the past 240 hour(s)).   Studies: Dg Chest 1 View  11/06/2013   CLINICAL DATA:  Preoperative evaluation  EXAM: CHEST - 1 VIEW  COMPARISON:  None.  FINDINGS: The heart size and mediastinal contours are within normal limits. Both lungs are clear. The visualized skeletal structures are unremarkable.  IMPRESSION: No active disease.   Electronically Signed   By: Salome Holmes M.D.   On: 11/06/2013 14:03   Dg Hip Complete Left  11/06/2013   CLINICAL DATA:  Left hip pain after fall.  EXAM: LEFT HIP - COMPLETE 2+ VIEW  COMPARISON:  None.  FINDINGS: Mildly displaced fracture is seen involving the proximal left femoral neck. Right hip appears normal. The left femoral head remains within the acetabulum.  IMPRESSION: Mildly displaced proximal left femoral neck fracture.   Electronically Signed   By: Roque Lias M.D.   On: 11/06/2013 13:36   Dg Pelvis Portable  11/07/2013   CLINICAL DATA:  Fracture fixation  EXAM: PORTABLE PELVIS 1-2 VIEWS  COMPARISON:  11/06/2013.  FINDINGS: Left hip hemiarthroplasty in satisfactory position alignment. No acute complication.  IMPRESSION: Satisfactory left hip hemiarthroplasty.   Electronically Signed   By: Marlan Palau M.D.   On: 11/07/2013 15:42   Dg Hip Portable 1 View Left  11/07/2013   CLINICAL DATA:  Fracture fixation  EXAM: PORTABLE LEFT HIP - 1 VIEW  COMPARISON:  11/06/2013  FINDINGS: Lateral views of the left hip reveal  left hip hemiarthroplasty in satisfactory position and alignment. No acute complication.  IMPRESSION: Satisfactory left hip hemiarthroplasty.   Electronically Signed   By: Marlan Palau M.D.   On: 11/07/2013 15:42    Scheduled Meds: . acetaminophen  1,000 mg Intravenous Once  . acetaminophen  1,000 mg Intravenous Q6H  . beta carotene w/minerals  1 tablet Oral Daily  . calcium carbonate  1 tablet Oral BID WC  . cholecalciferol  400 Units Oral Daily  . enoxaparin (LOVENOX) injection  40 mg Subcutaneous Q24H  . fluticasone  1 spray Each Nare Daily  . lactobacillus acidophilus  2 tablet Oral 2 times weekly  . loratadine  10 mg Oral Daily  . pantoprazole  40 mg Oral Daily  . scopolamine  1 patch Transdermal Once   Continuous Infusions: . sodium chloride 75 mL/hr at 11/08/13 0023    Active Problems:   Hyperlipidemia   Hypertension   Hip fracture, left   Environmental allergies    Time spent:>35 minutes    Esperanza Sheets  [facility] Pager [phone]. If 7PM-7AM, please contact night-coverage at [email or web address], password [email or web address] 11/08/2013, 10:50 AM  LOS: 2 days

## 2013-11-08 NOTE — Evaluation (Signed)
Physical Therapy Evaluation Patient Details Name: Sarah [name] MRN: [id number] DOB: [birth year] Today's Date: 11/08/2013 Time: 0957-1030 PT Time Calculation (min): 33 min  PT Assessment / Plan / Recommendation History of Present Illness  Sarah [name] is a 67 y.o. female has a past medical history significant for HTN, HLD, seasonal allergies, comes in with left hip pain after falling at home. This was a mechanical fall as she slipped on tiles. Now s/p bipolar arthroplasty  Clinical Impression  Patient is s/p above surgery resulting in functional limitations due to the deficits listed below (see PT Problem List).  Patient will benefit from skilled PT to increase their independence and safety with mobility to allow discharge to the venue listed below.       PT Assessment  Patient needs continued PT services    Follow Up Recommendations  Home health PT;Supervision/Assistance - 24 hour    Does the patient have the potential to tolerate intense rehabilitation      Barriers to Discharge        Equipment Recommendations  Rolling walker with 5" wheels;3in1 (PT)    Recommendations for Other Services OT consult   Frequency Min 6X/week    Precautions / Restrictions Precautions Precautions: Posterior Hip Precaution Comments: Educated pt in posterior prec Restrictions Weight Bearing Restrictions: Yes Other Position/Activity Restrictions: WBAT   Pertinent Vitals/Pain 1/10 L hip pain; patient repositioned for comfort       Mobility  Bed Mobility Bed Mobility: Supine to Sit;Sitting - Scoot to Edge of Bed Supine to Sit: 3: Mod assist;With rails Sitting - Scoot to Edge of Bed: 4: Min assist;With rail Details for Bed Mobility Assistance: cues for technique and post hip prec Transfers Transfers: Sit to Stand;Stand to Sit Sit to Stand: 4: Min assist;From bed;With upper extremity assist Stand to Sit: 4: Min assist;To chair/3-in-1;With upper extremity assist Details for Transfer  Assistance: Cues for post hip prec and prepositioning for post hip prec Ambulation/Gait Ambulation/Gait Assistance: 4: Min guard Ambulation Distance (Feet): 60 Feet Assistive device: Rolling walker Ambulation/Gait Assistance Details: cues for gait sequence, RW, and posture; Tolerating upright acticity very well Gait Pattern: Step-to pattern;Antalgic    Exercises     PT Diagnosis: Difficulty walking;Acute pain  PT Problem List: Decreased strength;Decreased range of motion;Decreased activity tolerance;Decreased balance;Decreased mobility;Decreased knowledge of use of DME;Decreased knowledge of precautions;Pain PT Treatment Interventions: DME instruction;Gait training;Stair training;Functional mobility training;Therapeutic activities;Therapeutic exercise;Patient/family education     PT Goals(Current goals can be found in the care plan section) Acute Rehab PT Goals Patient Stated Goal: Home ASAP PT Goal Formulation: With patient Time For Goal Achievement: 11/15/13 Potential to Achieve Goals: Good  Visit Information  Last PT Received On: 11/08/13 Assistance Needed: +1 History of Present Illness: Sarah [name] is a 67 y.o. female has a past medical history significant for HTN, HLD, seasonal allergies, comes in with left hip pain after falling at home. This was a mechanical fall as she slipped on tiles. Now s/p bipolar arthroplasty       Prior Functioning  Home Living Family/patient expects to be discharged to:: Private residence Living Arrangements: Spouse/significant other Available Help at Discharge: Family;Available 24 hours/day Type of Home: House Home Access: Stairs to enter [employer] of Steps: 5 Entrance Stairs-Rails: Right Home Layout: One level Home Equipment: Walker - 2 wheels Prior Function Level of Independence: Independent Communication Communication: No difficulties    Cognition  Cognition Arousal/Alertness: Awake/alert Behavior During Therapy: WFL  for tasks assessed/performed Overall Cognitive Status: Within  Functional Limits for tasks assessed    Extremity/Trunk Assessment Upper Extremity Assessment Upper Extremity Assessment: Overall WFL for tasks assessed Lower Extremity Assessment Lower Extremity Assessment: LLE deficits/detail LLE Deficits / Details: Grossly decr AROM and strength limited by pain postop   Balance    End of Session PT - End of Session Activity Tolerance: Patient tolerated treatment well Patient left: in chair;with call bell/phone within reach;with family/visitor present Nurse Communication: Mobility status;Precautions  GP     Van Clines [facility] [place], [place] [phone]  11/08/2013, 12:36 PM

## 2013-11-09 ENCOUNTER — Encounter (HOSPITAL_COMMUNITY): Payer: Self-pay | Admitting: Orthopaedic Surgery

## 2013-11-09 LAB — BASIC METABOLIC PANEL
BUN: 11 mg/dL (ref 6–23)
Chloride: 107 mEq/L (ref 96–112)
Creatinine, Ser: 0.89 mg/dL (ref 0.50–1.10)
GFR calc Af Amer: 76 mL/min — ABNORMAL LOW (ref >90)
GFR calc non Af Amer: 66 mL/min — ABNORMAL LOW (ref >90)
Sodium: 141 mEq/L (ref 135–145)

## 2013-11-09 LAB — CBC
HCT: 27.8 % — ABNORMAL LOW (ref 36.0–46.0)
MCH: 30.9 pg (ref 26.0–34.0)
MCHC: 34.5 g/dL (ref 30.0–36.0)
MCV: 89.4 fL (ref 78.0–100.0)
RDW: 14 % (ref 11.5–15.5)

## 2013-11-09 MED ORDER — ACETAMINOPHEN 325 MG PO TABS: 650.0000 mg | ORAL_TABLET | Freq: Four times a day (QID) | ORAL | Status: DC | PRN

## 2013-11-09 MED ORDER — ENOXAPARIN SODIUM 40 MG/0.4ML ~~LOC~~ SOLN: 40.0000 mg | SUBCUTANEOUS | Status: DC

## 2013-11-09 NOTE — Progress Notes (Signed)
Physical Therapy Treatment Patient Details Name: Sarah [name] MRN: [id number] DOB: 1946/09/15 Today's Date: 11/09/2013 Time: 7253-6644 PT Time Calculation (min): 19 min  PT Assessment / Plan / Recommendation  History of Present Illness ELLENORA TALTON is a 67 y.o. female has a past medical history significant for HTN, HLD, seasonal allergies, comes in with left hip pain after falling at home. This was a mechanical fall as she slipped on tiles. Now s/p bipolar arthroplasty   PT Comments   Pt progressing well with therapy & mobility.  Pt completed stair training this session.  Safe to d/c home from PT standpoint when MD feels medically ready.     Follow Up Recommendations  Home health PT;Supervision/Assistance - 24 hour     Does the patient have the potential to tolerate intense rehabilitation     Barriers to Discharge        Equipment Recommendations  Rolling walker with 5" wheels;3in1 (PT)    Recommendations for Other Services    Frequency Min 6X/week   Progress towards PT Goals Progress towards PT goals: Progressing toward goals  Plan Current plan remains appropriate    Precautions / Restrictions Precautions Precautions: Posterior Hip Precaution Comments: Pt able to recall 3/3 hip precautions Restrictions Other Position/Activity Restrictions: WBAT       Mobility  Bed Mobility Bed Mobility: Not assessed Transfers Transfers: Sit to Stand;Stand to Sit Sit to Stand: 5: Supervision;With upper extremity assist;With armrests;From chair/3-in-1 Stand to Sit: 5: Supervision;With upper extremity assist;With armrests;To chair/3-in-1 Details for Transfer Assistance: Pt demonstrates proper technique Ambulation/Gait Ambulation/Gait Assistance: 4: Min guard Ambulation Distance (Feet): 50 Feet Assistive device: Rolling walker Ambulation/Gait Assistance Details: Demonstrates proper sequencing & safe use of RW.  Cues for increased hip/knee flexion during swing phase LLE.   Gait  Pattern: Step-to pattern;Decreased weight shift to left;Decreased hip/knee flexion - left Stairs: Yes Stairs Assistance: 4: Min guard Stairs Assistance Details (indicate cue type and reason): cues for sequencing & technique Stair Management Technique: One rail Right;Step to pattern;Forwards Number of Stairs: 2 Wheelchair Mobility Wheelchair Mobility: No    Exercises Total Joint Exercises Marching in Standing: Left;10 reps;Standing     PT Goals (current goals can now be found in the care plan section) Acute Rehab PT Goals PT Goal Formulation: With patient Time For Goal Achievement: 11/15/13 Potential to Achieve Goals: Good  Visit Information  Last PT Received On: 11/09/13 Assistance Needed: +1 History of Present Illness: YTZEL GUBLER is a 67 y.o. female has a past medical history significant for HTN, HLD, seasonal allergies, comes in with left hip pain after falling at home. This was a mechanical fall as she slipped on tiles. Now s/p bipolar arthroplasty    Subjective Data      Cognition  Cognition Arousal/Alertness: Awake/alert Behavior During Therapy: WFL for tasks assessed/performed Overall Cognitive Status: Within Functional Limits for tasks assessed    Balance     End of Session PT - End of Session Activity Tolerance: Patient tolerated treatment well Patient left: in chair;with call bell/phone within reach;with family/visitor present Nurse Communication: Mobility status   GP     Lara Mulch 11/09/2013, 3:30 PM   Verdell Face, PTA [phone] 11/09/2013

## 2013-11-09 NOTE — Discharge Summary (Signed)
Physician Discharge Summary  Sarah [name] ZOX:[id number] DOB: [birth year] DOA: 11/06/2013  PCP: Lilia Argue  Admit date: 11/06/2013 Discharge date: 11/09/2013  Time spent: >35 minutes  Recommendations for Outpatient Follow-up:  F/u with ortho in 2 week F/u with PCP in 1-2 weeks   Discharge Diagnoses:  Active Problems:   Hyperlipidemia   Hypertension   Hip fracture, left   Environmental allergies   Discharge Condition: stable   Diet recommendation: heart healthy  Filed Weights   11/08/13 0900  Weight: 67.359 kg (148 lb 8 oz)    History of present illness:  67 y.o. female has a past medical history significant for HTN, HLD, seasonal allergies, comes in with left hip pain after falling at home found L hip fracture   Hospital Course:  1. Fall mechanical -->L hip fracture;  -s/p L hip arthroplasty; lovenox x 2 week dvt prophylaxis per ortho; f/u with ortho outpatient   2. HTN resume hoem meds 3. HPL cont statin  4. Mild leukocytosis likely stress induced; no s/s of infection; UA: unremarkable    Procedures:  S/p hip surgery (i.e. Studies not automatically included, echos, thoracentesis, etc; not x-rays)  Consultations:  ortho  Discharge Exam: Filed Vitals:   11/09/13 0800  BP:   Pulse:   Temp:   Resp: 18    General: alert Cardiovascular: s1,s2 rrr Respiratory: CTA BL  Discharge Instructions  Discharge Orders   Future Appointments Provider Department Dept Phone   08/03/2014 9:30 AM Melony Overly, MD [facility] [phone]   Future Orders Complete By Expires   Diet - low sodium heart healthy  As directed    Discharge instructions  As directed    Comments:     F/u with orthopedics in 2 weeks   Increase activity slowly  As directed        Medication List    STOP taking these medications       aspirin 81 MG tablet     conjugated estrogens vaginal cream  Commonly known as:  PREMARIN      TAKE these medications       acetaminophen 325 MG tablet  Commonly known as:  TYLENOL  Take 2 tablets (650 mg total) by mouth every 6 (six) hours as needed for mild pain (or Fever >/= 101).     beta carotene w/minerals tablet  Take 1 tablet by mouth daily.     calcium carbonate 600 MG Tabs tablet  Commonly known as:  OS-CAL  Take 600 mg by mouth 2 (two) times daily with a meal.     cetirizine 10 MG tablet  Commonly known as:  ZYRTEC  Take 10 mg by mouth daily.     cholecalciferol 400 UNIT/ML Liqd  Commonly known as:  D-VI-SOL  Take 400 Units by mouth daily.     clobetasol ointment 0.05 %  Commonly known as:  TEMOVATE  Apply 1 application topically 2 (two) times a week.     CoQ10 200 MG Caps  Take 1 tablet by mouth daily.     DIOVAN 80 MG tablet  Generic drug:  valsartan  Take 80 mg by mouth daily.     docusate sodium 100 MG capsule  Commonly known as:  COLACE  Take 100 mg by mouth as needed for constipation.     enoxaparin 40 MG/0.4ML injection  Commonly known as:  LOVENOX  Inject 0.4 mLs (40 mg total) into the skin daily.     EPIPEN 2-PAK 0.3  mg/0.3 mL Soaj injection  Generic drug:  EPINEPHrine  Inject 1 mg into the muscle as needed.     ESTER-C 500-60 MG Tabs  Take 1 capsule by mouth daily.     fish oil-omega-3 fatty acids 1000 MG capsule  Take 2 g by mouth daily.     fluticasone 50 MCG/ACT nasal spray  Commonly known as:  FLONASE  Place 50 sprays into the nose daily.     lactobacillus acidophilus Tabs tablet  Take 2 tablets by mouth 2 (two) times a week.     multivitamin capsule  Take 1 capsule by mouth daily.     NEXIUM 40 MG capsule  Generic drug:  esomeprazole  Take 1 capsule by mouth daily.     PATANASE 0.6 % Soln  Generic drug:  Olopatadine HCl  Place 1 puff into the nose as needed.     simvastatin 10 MG tablet  Commonly known as:  ZOCOR  Take 1 tablet by mouth daily.     vitamin B-12 1000 MCG tablet  Commonly known as:  CYANOCOBALAMIN  Take 1,000 mcg by mouth  daily.       Allergies  Allergen Reactions  . Evista [Raloxifene]     Respiratory infections, shortness of breath(not anaphylaxis), leg heaviness.  . Augmentin [Amoxicillin-Pot Clavulanate] Hives       Follow-up Information   [facility] up [facility] Claude Manges, MD In 2 weeks.   Specialty:  Orthopedic Surgery   Contact information:   [address] [place] [address] [phone]        The results of significant diagnostics from this hospitalization (including imaging, microbiology, ancillary and laboratory) are listed below for reference.    Significant Diagnostic Studies: Dg Chest 1 View  11/06/2013   CLINICAL DATA:  Preoperative evaluation  EXAM: CHEST - 1 VIEW  COMPARISON:  None.  FINDINGS: The heart size and mediastinal contours are within normal limits. Both lungs are clear. The visualized skeletal structures are unremarkable.  IMPRESSION: No active disease.   Electronically Signed   By: Salome Holmes M.D.   On: 11/06/2013 14:03   Dg Hip Complete Left  11/06/2013   CLINICAL DATA:  Left hip pain after fall.  EXAM: LEFT HIP - COMPLETE 2+ VIEW  COMPARISON:  None.  FINDINGS: Mildly displaced fracture is seen involving the proximal left femoral neck. Right hip appears normal. The left femoral head remains within the acetabulum.  IMPRESSION: Mildly displaced proximal left femoral neck fracture.   Electronically Signed   By: Roque Lias M.D.   On: 11/06/2013 13:36   Dg Pelvis Portable  11/07/2013   CLINICAL DATA:  Fracture fixation  EXAM: PORTABLE PELVIS 1-2 VIEWS  COMPARISON:  11/06/2013.  FINDINGS: Left hip hemiarthroplasty in satisfactory position alignment. No acute complication.  IMPRESSION: Satisfactory left hip hemiarthroplasty.   Electronically Signed   By: Marlan Palau M.D.   On: 11/07/2013 15:42   Dg Hip Portable 1 View Left  11/07/2013   CLINICAL DATA:  Fracture fixation  EXAM: PORTABLE LEFT HIP - 1 VIEW  COMPARISON:  11/06/2013  FINDINGS: Lateral views of  the left hip reveal left hip hemiarthroplasty in satisfactory position and alignment. No acute complication.  IMPRESSION: Satisfactory left hip hemiarthroplasty.   Electronically Signed   By: Marlan Palau M.D.   On: 11/07/2013 15:42    Microbiology: No results found for this or any previous visit (from the past 240 hour(s)).   Labs: Basic Metabolic Panel:  Recent Labs  Lab 11/06/13 1450 11/07/13 0520 11/07/13 1630 11/08/13 0253 11/09/13 0540  NA 141 143  --  142 141  K 3.7 3.6  --  3.6 3.4*  CL 105 110  --  108 107  CO2 23 26  --  24 23  GLUCOSE 99 93  --  121* 93  BUN 17 15  --  9 11  CREATININE 0.90 0.88 0.89 0.96 0.89  CALCIUM 9.0 8.5  --  8.2* 8.2*   Liver Function Tests:  Recent Labs Lab 11/06/13 1450  AST 28  ALT 28  ALKPHOS 70  BILITOT 0.3  PROT 6.7  ALBUMIN 3.6   No results found for this basename: LIPASE, AMYLASE,  in the last 168 hours No results found for this basename: AMMONIA,  in the last 168 hours CBC:  Recent Labs Lab 11/06/13 1450 11/07/13 1630 11/08/13 0253 11/09/13 0540  WBC 13.5* 10.1 10.4 7.6  NEUTROABS 12.1*  --   --   --   HGB 13.0 11.4* 10.5* 9.6*  HCT 36.4 33.1* 30.3* 27.8*  MCV 88.1 89.0 89.1 89.4  PLT 152 125* 136* 123*   Cardiac Enzymes: No results found for this basename: CKTOTAL, CKMB, CKMBINDEX, TROPONINI,  in the last 168 hours BNP: BNP (last 3 results) No results found for this basename: PROBNP,  in the last 8760 hours CBG: No results found for this basename: GLUCAP,  in the last 168 hours     Signed:  Esperanza Sheets  [facility] 11/09/2013, 10:12 AM

## 2013-11-09 NOTE — Progress Notes (Signed)
D/C via wheelchair in stable condition. Sent with [facility]. Has DME, HHPT is arranged.

## 2014-01-19 ENCOUNTER — Encounter: Payer: Self-pay | Admitting: Gastroenterology

## 2014-08-03 ENCOUNTER — Ambulatory Visit (INDEPENDENT_AMBULATORY_CARE_PROVIDER_SITE_OTHER): Payer: Medicare Other | Admitting: Obstetrics and Gynecology

## 2014-08-03 ENCOUNTER — Encounter: Payer: Self-pay | Admitting: Obstetrics and Gynecology

## 2014-08-03 VITALS — BP 120/78 | HR 60 | Ht 64.25 in | Wt 149.0 lb

## 2014-08-03 DIAGNOSIS — M810 Age-related osteoporosis without current pathological fracture: Secondary | ICD-10-CM

## 2014-08-03 NOTE — Progress Notes (Signed)
GYNECOLOGY VISIT  PCP: Dr. Percival Spanish  Referring provider:   HPI: 68 y.o.   Married  Caucasian  female   [id number] with No LMP recorded. Patient has had a hysterectomy.   here for Annual Had left hip fracture after a fall on hard surface at church.  Fell hard.  Had a titanium ball - Dr. Durward Fortes.  Feels 95% recovered.  Is very active - kayaking.  Saw Dr. Corena Pilgrim about osteoporosis and did research about Prolia.  Declines all therapy.  Taping off Nexium.   Takes antibiotic when she had procedures and dental work done. Took Clindamycin (due to protocol for hip replacement) and developed diarrhea.  Took Flagyl.   Some vaginal itching and wondering if she has yeast infection.   Some vaginal bulge, but not too significant.   Takes allergy shots.   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  Bought a new house and selling current home.   Hgb:  PCP  Urine: PCP   GYNECOLOGIC HISTORY: No LMP recorded. Patient has had a hysterectomy. Sexually active:  yes Partner preference: female Contraception:   hysterectomy Menopausal hormone therapy:no  DES exposure:   no Blood transfusions:no    Sexually transmitted diseases: no    GYN procedures and prior surgeries: hysterectomy, rectocele repair, sacrocolopopexy,    Last mammogram: 08/25/13 BI-RADS neg                Last pap and high risk HPV testing:   07/2008 wnl History of abnormal pap smear: no    OB History   Grav Para Term Preterm Abortions TAB SAB Ect Mult Living   2 1 1  1     1        LIFESTYLE: Exercise: walking and biking               OTHER HEALTH MAINTENANCE: Tetanus/TDap: with 5 years HPV:no Influenza:09/2013     Bone density:09/07/2013 , Osteoporosis Colonoscopy:02/2006 wnl  Cholesterol check: PCP  Family History  Problem Relation Age of Onset  . Hypertension Mother   . Diabetes Father   . Hypertension Father   . Diabetes Sister   . Hypertension Sister   . Thyroid disease Sister      Patient Active Problem List   Diagnosis Date Noted  . Hyperlipidemia 11/06/2013  . Hypertension 11/06/2013  . Hip fracture, left 11/06/2013  . Environmental allergies 11/06/2013   Past Medical History  Diagnosis Date  . Hypertension   . Hyperlipidemia   . Osteopenia   . Osteoporosis     Past Surgical History  Procedure Laterality Date  . Neck fusion      C-4, C-5 fusion  . Arthroscopic shoulder Left     Dr. Durward Fortes  . Arthroscopic knee Bilateral     Dr. Durward Fortes  . Cholecystectomy      Dr. Georgina Quint  . Appendectomy      Dr. Hassell Done  . Abdominal hysterectomy  1995    TVH--still has ovaries  . Bladder suspension    . Abdominal sacrocolpopexy  2007    Halban culdoplasty & Burch procedure--Dr. Quincy Simmonds  . Other surgical history  2011    posterior Colporrhaphy with Xenform biological graft--Dr. Quincy Simmonds  . Rectocele repair  2011    posterior colporrhaphy with Xenform biological graft--Dr. Quincy Simmonds  . Esophageal dilation    . Hip arthroplasty Left 11/07/2013    Procedure: ARTHROPLASTY BIPOLAR HIP;  Surgeon: Garald Balding, MD;  Location: [facility];  Service: Orthopedics;  Laterality: Left;    ALLERGIES: Evista and Augmentin  Current Outpatient Prescriptions  Medication Sig Dispense Refill  . acetaminophen (TYLENOL) 325 MG tablet Take 2 tablets (650 mg total) by mouth every 6 (six) hours as needed for mild pain (or Fever >/= 101).      . beta carotene w/minerals (OCUVITE) tablet Take 1 tablet by mouth daily.      . calcium carbonate (OS-CAL) 600 MG TABS tablet Take 600 mg by mouth 2 (two) times daily with a meal.      . cetirizine (ZYRTEC) 10 MG tablet Take 10 mg by mouth daily.      . cholecalciferol (D-VI-SOL) 400 UNIT/ML LIQD Take 400 Units by mouth daily.      . clobetasol ointment (TEMOVATE) 0.16 % Apply 1 application topically 2 (two) times a week.  30 g  1  . Coenzyme Q10 (COQ10) 200 MG CAPS Take 1 tablet by mouth daily.      Marland Kitchen DIOVAN 80 MG tablet Take 80 mg by  mouth daily.       Marland Kitchen docusate sodium (COLACE) 100 MG capsule Take 100 mg by mouth as needed for constipation.      Marland Kitchen EPIPEN 2-PAK 0.3 MG/0.3ML SOAJ injection Inject 1 mg into the muscle as needed.      . fish oil-omega-3 fatty acids 1000 MG capsule Take 2 g by mouth daily.      . fluticasone (FLONASE) 50 MCG/ACT nasal spray Place 50 sprays into the nose daily.      Marland Kitchen lactobacillus acidophilus (BACID) TABS tablet Take 2 tablets by mouth 2 (two) times a week.      . Multiple Vitamin (MULTIVITAMIN) capsule Take 1 capsule by mouth daily.      Marland Kitchen NEXIUM 40 MG capsule Take 1 capsule by mouth daily.      . Olopatadine HCl (PATANASE) 0.6 % SOLN Place 1 puff into the nose as needed.      . simvastatin (ZOCOR) 10 MG tablet Take 1 tablet by mouth daily.      . vitamin B-12 (CYANOCOBALAMIN) 1000 MCG tablet Take 1,000 mcg by mouth daily.      . Vitamin Mixture (ESTER-C) 500-60 MG TABS Take 1 capsule by mouth daily.       No current facility-administered medications for this visit.     ROS:  Pertinent items are noted in HPI.  History   Social History  . Marital Status: Married    Spouse Name: N/A    Number of Children: N/A  . Years of Education: N/A   Occupational History  . Not on file.   Social History Main Topics  . Smoking status: Never Smoker   . Smokeless tobacco: Not on file  . Alcohol Use: Yes     Comment: 7 glasses of wine per week  . Drug Use: No  . Sexual Activity: Yes    Birth Control/ Protection: Other-see comments     Comment: TVH--Still has ovaries   Other Topics Concern  . Not on file   Social History Narrative  . No narrative on file    PHYSICAL EXAMINATION:    BP 120/78  Pulse 60  Ht 5' 4.25" (1.632 m)  Wt 149 lb (67.586 kg)  BMI 25.38 kg/m2   Wt Readings from Last 3 Encounters:  08/03/14 149 lb (67.586 kg)  11/08/13 148 lb 8 oz (67.359 kg)  11/08/13 148 lb 8 oz (67.359 kg)     Ht Readings from Last 3 Encounters:  08/03/14 5' 4.25" (1.632 m)  11/08/13 5'  6" (1.676 m)  11/08/13 5\' 6"  (1.676 m)    General appearance: alert, cooperative and appears stated age Head: Normocephalic, without obvious abnormality, atraumatic Neck: no adenopathy, supple, symmetrical, trachea midline and thyroid not enlarged, symmetric, no tenderness/mass/nodules Lungs: clear to auscultation bilaterally Breasts: Inspection negative, No nipple retraction or dimpling, No nipple discharge or bleeding, No axillary or supraclavicular adenopathy, Normal to palpation without dominant masses Heart: regular rate and rhythm Abdomen: soft, non-tender; no masses,  no organomegaly Extremities: extremities normal, atraumatic, no cyanosis or edema Skin: Skin color, texture, turgor normal. No rashes or lesions Lymph nodes: Cervical, supraclavicular, and axillary nodes normal. No abnormal inguinal nodes palpated Neurologic: Grossly normal  Pelvic: External genitalia:  no lesions              Urethra:  normal appearing urethra with no masses, tenderness or lesions              Bartholins and Skenes: normal                 Vagina: normal appearing vagina with normal color and discharge, no lesions, minimal cystocele, good posterior vaginal support, good apical support.               Cervix:  absent              Pap and high risk HPV testing done: No.        Bimanual Exam:  Uterus:   absent                                      Adnexa: normal adnexa in size, nontender and no masses                                      Rectovaginal:  Yes.                                      Confirms above.                                      Anus:  normal sphincter tone, no lesions  ASSESSMENT  Normal gynecologic exam. Osteoporosis.  Declines therapy.  Status post left hip fracture.    PLAN  Mammogram recommended yearly starting at age 32. Pap smear and high risk HPV testing as above. Counseled on self breast exam, Calcium and vitamin D intake, exercise. See lab orders: No. Return annually  or prn   An After Visit Summary was printed and given to the patient.

## 2014-08-03 NOTE — Patient Instructions (Signed)

## 2014-08-11 ENCOUNTER — Encounter: Payer: Self-pay | Admitting: Obstetrics and Gynecology

## 2014-10-17 ENCOUNTER — Encounter: Payer: Self-pay | Admitting: Obstetrics and Gynecology

## 2014-10-24 ENCOUNTER — Other Ambulatory Visit: Payer: Self-pay | Admitting: Dermatology

## 2015-03-21 ENCOUNTER — Encounter: Payer: Self-pay | Admitting: Gastroenterology

## 2015-04-25 ENCOUNTER — Ambulatory Visit: Payer: Medicare Other | Attending: Orthopaedic Surgery | Admitting: Physical Therapy

## 2015-04-25 DIAGNOSIS — R293 Abnormal posture: Secondary | ICD-10-CM | POA: Diagnosis not present

## 2015-04-25 DIAGNOSIS — M5030 Other cervical disc degeneration, unspecified cervical region: Secondary | ICD-10-CM | POA: Insufficient documentation

## 2015-04-25 DIAGNOSIS — M542 Cervicalgia: Secondary | ICD-10-CM

## 2015-04-25 DIAGNOSIS — M25512 Pain in left shoulder: Secondary | ICD-10-CM | POA: Insufficient documentation

## 2015-04-25 DIAGNOSIS — M898X1 Other specified disorders of bone, shoulder: Secondary | ICD-10-CM

## 2015-04-25 NOTE — Patient Instructions (Signed)
POSTURAL CORRECTION Tips B THINK of your body as a whole unit, extending from the bottom of your feet to the top of your head. All parts of the body are inter-connected and need to be in good anatomical alignment for the better health and functioning of the whole.   Copyright  VHI. All rights reserved.  POSTURAL CORRECTION Tips D   STAND on the Foot Triangle of Support frequently during the day. Standing on the Foot Triangle of Support means to stand with weight on the heel, along the bone between the heel and the little toe, and on the bones just behind the little and the large toes. The Foot Triangle of Support is the starting point for all postural correction exercises.   Copyright  VHI. All rights reserved.  POSTURAL CORRECTION Tips F PRACTICE: -anytime, anywhere - e.g., standing in line at the supermarket, after finishing brushing your teeth or combing your hair, while talking on the telephone, waiting at the bus / trolley stop, etc. -elements of postural correction while sitting working at your computer, reading, driving, or other seated activity. -good body alignment when lying down. NOTICE as you practice, your old, bad habits are replaced by new ones that become more natural.  Copyright  VHI. All rights reserved.  RE-ALIGNMENT ROUTINE EXERCISES-OSTEOPROROSIS BASIC FOR POSTURAL CORRECTION   RE-ALIGNMENT Tips BENEFITS: 1.It helps to re-align the curves of the back and improve standing posture. 2.It allows the back muscles to rest and strengthen in preparation for more activity. FREQUENCY: Daily, even after weeks, months and years of more advanced exercises. START: 1.All exercises start in the same position: lying on the back, arms resting on the supporting surface, palms up and slightly away from the body, backs of hands down, knees bent, feet flat. 2.The head, neck, arms, and legs are supported according to specific instructions of your therapist. Copyright  VHI. All rights  reserved.    1. Decompression Exercise: Basic.   Takes compression off the vertebral bodies; increases tolerance for lying on the back; helps relieve back pain   Lie on back on firm surface, knees bent, feet flat, arms turned up, out to sides (~35 degrees). Head neck and arms supported as necessary. Time _5-15__ minutes. Surface: floor     2. Shoulder Press  Strengthens upper back extensors and scapular retractors.   Press both shoulders down. Hold _5_ seconds. Repeat _5__ times. Surface: bed        3. Head Press With [place]  Strengthens neck extensors   Tuck chin SLIGHTLY toward chest, keep mouth closed. Feel weight on back of head.Imprint head into pillow Increase weight by pressing head down. Hold _5__ seconds. Relax. Repeat 3-5___ times. Surface: bed  Patient able to return demonstration correctly with above exercises.

## 2015-04-25 NOTE — Therapy (Signed)
[facility] [address] [place], [place], [address] Phone: [phone]   Fax:  [phone]  Physical Therapy Evaluation  Patient Details  Name: Sarah [name] MRN: [id number] Date of Birth: December 24, 1945 Referring Provider:  Garald Balding, MD  Encounter Date: 04/25/2015      PT End of Session - 04/25/15 0853    Visit Number 1   Number of Visits 10  Medicare   Date for PT Re-Evaluation 06/06/15   PT Start Time 0845   PT Stop Time 0930   PT Time Calculation (min) 45 min   Activity Tolerance Patient tolerated treatment well   Behavior During Therapy [facility] for tasks assessed/performed      Past Medical History  Diagnosis Date  . Hypertension   . Hyperlipidemia   . Osteopenia   . Osteoporosis     Past Surgical History  Procedure Laterality Date  . Neck fusion      C-4, C-5 fusion  . Arthroscopic shoulder Left     Dr. Durward Fortes  . Arthroscopic knee Bilateral     Dr. Durward Fortes  . Cholecystectomy      Dr. Georgina Quint  . Appendectomy      Dr. Hassell Done  . Abdominal hysterectomy  1995    TVH--still has ovaries  . Bladder suspension    . Abdominal sacrocolpopexy  2007    Halban culdoplasty & Burch procedure--Dr. Quincy Simmonds  . Other surgical history  2011    posterior Colporrhaphy with Xenform biological graft--Dr. Quincy Simmonds  . Rectocele repair  2011    posterior colporrhaphy with Xenform biological graft--Dr. Quincy Simmonds  . Esophageal dilation    . Hip arthroplasty Left 11/07/2013    Procedure: ARTHROPLASTY BIPOLAR HIP;  Surgeon: Garald Balding, MD;  Location: [place];  Service: Orthopedics;  Laterality: Left;    There were no vitals filed for this visit.  Visit Diagnosis:  Pain of left scapula - Plan: PT plan of care cert/re-cert  Neck pain - Plan: PT plan of care cert/re-cert  Abnormal posture - Plan: PT plan of care cert/re-cert      Subjective Assessment - 04/25/15 0849    Subjective Patient reports  pain along the left scapula that is intermittent and triggered by left arm movement.  Patient reports pain is progressively worse last 6 months with sudden onset. Neck is stiff.    Pertinent History C4-5 is fused, Osteoporosis   Currently in Pain? Yes   Pain Score 10-Worst pain ever   Pain Location Scapula   Pain Orientation Left   Pain Descriptors / Indicators Sore;Sharp  very brief, stiff neck   Pain Onset More than a month ago   Pain Frequency Intermittent   Aggravating Factors  sit with left arm propped up on chair, swimming,   Pain Relieving Factors bring arm to side, pressure on left scapula   Multiple Pain Sites No            [facility] PT Assessment - 04/25/15 0001    Assessment   Medical Diagnosis neck, left scapula, cervical DDD, left shoulder pain   Onset Date 11/16/15   Prior Therapy yes   Precautions   Precautions Other (comment)   Precaution Comments No joint mobilization   Balance Screen   Has the patient fallen in the past 6 months No   Has the patient had a decrease in activity level because of a fear of falling?  No   Is the patient reluctant to leave their  home because of a fear of falling?  No   Prior Function   Level of Independence Independent with basic ADLs   Vocation Retired   Leisure walking and looking for shells, after has increaed neck pain   Observation/Other Assessments   Focus on Therapeutic Outcomes (FOTO)  32% limitation   Posture/Postural Control   Posture/Postural Control Postural limitations   Postural Limitations Rounded Shoulders;Forward head;Increased thoracic kyphosis   Posture Comments increased cervical thoracic kyphosis   ROM / Strength   AROM / PROM / Strength --  left shoulder strength is 4/5   AROM   Left Shoulder Flexion 155 Degrees   Left Shoulder ABduction 175 Degrees   Cervical Flexion decreased by 25%   Cervical Extension decreased by 75%   Cervical - Right Side Bend decreased by 25%   Cervical - Left Side Bend decreased  by 25%   Cervical - Right Rotation decreased by 50%  pain   Cervical - Left Rotation decreased by 50%  pain   Palpation   Palpation muscle spasm along left scapula border, pin point tenderness on left side of C5-6, tightness in left upper trap and levator scapulae                           PT Education - 04/25/15 0918    Education provided Yes   Education Details posture, head press, shoulder press   Person(s) Educated Patient   Methods Explanation;Demonstration;Tactile cues;Verbal cues;Handout   Comprehension Returned demonstration;Verbalized understanding          PT Short Term Goals - 04/25/15 0907    PT SHORT TERM GOAL #1   Title walking on the beach with correct posture decreased >/= 25%   PT SHORT TERM GOAL #2   Title understand ways to elongate her cervical to manage her pain and improve posture           PT Long Term Goals - 04/25/15 0904    PT LONG TERM GOAL #1   Title after walking on the beach with decreased pain >/= 75%   Time 6   Period Weeks   Status New   PT LONG TERM GOAL #2   Title increased flexibility in cervical so ROM has improved by 25%   Time 6   Period Weeks   Status New   PT LONG TERM GOAL #3   Title improved postural awareness to decrease cervcial lordosis and thoracic kyphosis to have pain at manageable level   Time 6   Period Weeks   Status New               Plan - 04/25/15 8315    Clinical Impression Statement Patient is a 69 year old female with left scapula pain, decreased cervical ROM, decreased left shoulder strength and poor posture.  This started suddenly 6 months ago and has been porgressively worse .  Patient has increased tightness in left upper trap and levator.  Patient FOTO score is  29% limititaion.  Patient left shoulder strenght is 4/5. Patient will have increased pain while walking and looking for shells and when she is holding her left arm up on a chair.    Pt will benefit from skilled  therapeutic intervention in order to improve on the following deficits Decreased range of motion;Impaired flexibility;Improper body mechanics;Postural dysfunction;Decreased activity tolerance;Increased fascial restricitons;Pain;Increased muscle spasms;Decreased mobility;Decreased strength   Rehab Potential Excellent   PT Frequency 2x / week  PT Duration 6 weeks   PT Treatment/Interventions Moist Heat;Therapeutic activities;Patient/family education;Passive range of motion;Therapeutic exercise;Ultrasound;Manual techniques;Neuromuscular re-education;Cryotherapy;Electrical Stimulation;Functional mobility training   PT Next Visit Plan modalities as needed, soft tissue work to left scapula area and cervical, postural education, left shoulder strengthening, cervical rom ex.    PT Home Exercise Plan scapula retraction    Recommended Other Services None   Consulted and Agree with Plan of Care Patient          G-Codes - 05-17-15 0919    Functional Assessment Tool Used FOTO score is 29% limitation due to posture and pain   Functional Limitation Other PT primary   Other PT Primary Current Status (Y5183) At least 20 percent but less than 40 percent impaired, limited or restricted   Other PT Primary Goal Status (F5825) At least 20 percent but less than 40 percent impaired, limited or restricted       Problem List Patient Active Problem List   Diagnosis Date Noted  . Osteoporosis, unspecified 08/03/2014  . Hyperlipidemia 11/06/2013  . Hypertension 11/06/2013  . Hip fracture, left 11/06/2013  . Environmental allergies 11/06/2013    Cielle Aguila,PT 05-17-2015, 9:27 AM   [facility] [address] [place], [place], [address] Phone: [phone]   Fax:  [phone]

## 2015-04-26 ENCOUNTER — Encounter: Payer: Self-pay | Admitting: Physical Therapy

## 2015-04-26 ENCOUNTER — Ambulatory Visit: Payer: Medicare Other | Admitting: Physical Therapy

## 2015-04-26 DIAGNOSIS — R293 Abnormal posture: Secondary | ICD-10-CM

## 2015-04-26 DIAGNOSIS — M898X1 Other specified disorders of bone, shoulder: Secondary | ICD-10-CM

## 2015-04-26 DIAGNOSIS — M542 Cervicalgia: Secondary | ICD-10-CM

## 2015-04-26 DIAGNOSIS — M5030 Other cervical disc degeneration, unspecified cervical region: Secondary | ICD-10-CM | POA: Diagnosis not present

## 2015-04-26 NOTE — Therapy (Signed)
[facility] [address], [place], [place], [address] Phone: [phone]   Fax:  [phone]  Physical Therapy Treatment  Patient Details  Name: Sarah [name] MRN: [id number] Date of Birth: 1946/01/04 Referring Provider:  Aletha Halim., PA-C  Encounter Date: 04/26/2015      PT End of Session - 04/26/15 1326    Visit Number 2   Number of Visits 10   Date for PT Re-Evaluation 06/06/15   PT Start Time 1106   PT Stop Time 1145   PT Time Calculation (min) 39 min   Activity Tolerance Patient tolerated treatment well   Behavior During Therapy [facility] for tasks assessed/performed      Past Medical History  Diagnosis Date  . Hypertension   . Hyperlipidemia   . Osteopenia   . Osteoporosis     Past Surgical History  Procedure Laterality Date  . Neck fusion      C-4, C-5 fusion  . Arthroscopic shoulder Left     Dr. Durward Fortes  . Arthroscopic knee Bilateral     Dr. Durward Fortes  . Cholecystectomy      Dr. Georgina Quint  . Appendectomy      Dr. Hassell Done  . Abdominal hysterectomy  1995    TVH--still has ovaries  . Bladder suspension    . Abdominal sacrocolpopexy  2007    Halban culdoplasty & Burch procedure--Dr. Quincy Simmonds  . Other surgical history  2011    posterior Colporrhaphy with Xenform biological graft--Dr. Quincy Simmonds  . Rectocele repair  2011    posterior colporrhaphy with Xenform biological graft--Dr. Quincy Simmonds  . Esophageal dilation    . Hip arthroplasty Left 11/07/2013    Procedure: ARTHROPLASTY BIPOLAR HIP;  Surgeon: Garald Balding, MD;  Location: [place];  Service: Orthopedics;  Laterality: Left;    There were no vitals filed for this visit.  Visit Diagnosis:  Pain of left scapula  Neck pain  Abnormal posture      Subjective Assessment - 04/26/15 1110    Currently in Pain? Yes   Pain Score 2    Pain Location Shoulder   Pain Orientation Left   Pain Descriptors / Indicators Aching   Pain Type  Chronic pain   Pain Onset More than a month ago   Pain Frequency Intermittent   Multiple Pain Sites No                         [facility] Adult PT Treatment/Exercise - 04/26/15 0001    Exercises   Exercises Neck;Shoulder   Neck Exercises: Theraband   Scapula Retraction 20 reps;10 reps  with    Shoulder Exercises: Seated   Retraction Strengthening;20 reps;Other (comment)  5sec hold   Shoulder Exercises: Pulleys   Flexion 3 minutes  flexion   Shoulder Exercises: ROM/Strengthening   UBE (Upper Arm Bike) L 1 x30min(3/3)   Shoulder Exercises: Stretch   Cross Chest Stretch 5 reps;10 seconds;Other (comment)  in sitting arms crossed over holding on ankles, and in stand   Manual Therapy   Manual Therapy Myofascial release;Other (comment)  release of surrounding muscles of Lt scapular /rhomboids                PT Education - 04/25/15 0918    Education provided Yes   Education Details posture, head press, shoulder press   Person(s) Educated Patient   Methods Explanation;Demonstration;Tactile cues;Verbal cues;Handout   Comprehension Returned demonstration;Verbalized understanding  PT Short Term Goals - 05-17-2015 0907    PT SHORT TERM GOAL #1   Title walking on the beach with correct posture decreased >/= 25%   PT SHORT TERM GOAL #2   Title understand ways to elongate her cervical to manage her pain and improve posture           PT Long Term Goals - 05-17-2015 0904    PT LONG TERM GOAL #1   Title after walking on the beach with decreased pain >/= 75%   Time 6   Period Weeks   Status New   PT LONG TERM GOAL #2   Title increased flexibility in cervical so ROM has improved by 25%   Time 6   Period Weeks   Status New   PT LONG TERM GOAL #3   Title improved postural awareness to decrease cervcial lordosis and thoracic kyphosis to have pain at manageable level   Time 6   Period Weeks   Status New               Plan - 04/26/15 1327     Clinical Impression Statement Patient with tenderness on Lt shoulder blade area, with decreased cervical ROM and decreased left shoulder strength and poor posture. The pain started suddenly about 6 month ago and has been progressing worse. Pt very tender to touch in rhomboid area and  limited scapular mob   Pt will benefit from skilled therapeutic intervention in order to improve on the following deficits Decreased range of motion;Improper body mechanics;Postural dysfunction;Decreased activity tolerance;Increased fascial restricitons;Pain;Increased muscle spasms;Decreased mobility;Decreased strength   Rehab Potential Excellent   PT Frequency 2x / week   PT Duration 6 weeks   PT Treatment/Interventions Moist Heat;Therapeutic activities;Patient/family education;Passive range of motion;Therapeutic exercise;Ultrasound;Manual techniques;Neuromuscular re-education;Cryotherapy;Electrical Stimulation;Functional mobility training   PT Next Visit Plan modalities as needed, soft tissue work to left scapula area and cervical, postural education, left shoulder strengthening, cervical rom ex.    PT Home Exercise Plan scapula retraction    Consulted and Agree with Plan of Care Patient          G-Codes - 2015-05-17 0919    Functional Assessment Tool Used FOTO score is 29% limitation due to posture and pain   Functional Limitation Other PT primary   Other PT Primary Current Status (Z6606) At least 20 percent but less than 40 percent impaired, limited or restricted   Other PT Primary Goal Status (T0160) At least 20 percent but less than 40 percent impaired, limited or restricted      Problem List Patient Active Problem List   Diagnosis Date Noted  . Osteoporosis, unspecified 08/03/2014  . Hyperlipidemia 11/06/2013  . Hypertension 11/06/2013  . Hip fracture, left 11/06/2013  . Environmental allergies 11/06/2013    NAUMANN-HOUEGNIFIO,Sarah [name] PTA 04/26/2015, 1:37 PM  [facility] [address], [place], [place], [address] Phone: [phone]   Fax:  [phone]

## 2015-05-01 ENCOUNTER — Ambulatory Visit: Payer: Medicare Other | Admitting: Physical Therapy

## 2015-05-01 DIAGNOSIS — M542 Cervicalgia: Secondary | ICD-10-CM

## 2015-05-01 DIAGNOSIS — R293 Abnormal posture: Secondary | ICD-10-CM

## 2015-05-01 DIAGNOSIS — M5030 Other cervical disc degeneration, unspecified cervical region: Secondary | ICD-10-CM | POA: Diagnosis not present

## 2015-05-01 DIAGNOSIS — M898X1 Other specified disorders of bone, shoulder: Secondary | ICD-10-CM

## 2015-05-01 NOTE — Therapy (Signed)
[facility] [address], [place], [place], [address] Phone: [phone]   Fax:  [phone]  Physical Therapy Treatment  Patient Details  Name: DEMARIA DEENEY MRN: [id number] Date of Birth: June 24, 1946 Referring Provider:  Garald Balding, MD  Encounter Date: 05/01/2015      PT End of Session - 05/01/15 1443    Visit Number 3   Number of Visits 10   Date for PT Re-Evaluation 06/06/15   PT Start Time 1400   PT Stop Time 1440   PT Time Calculation (min) 40 min   Activity Tolerance Patient tolerated treatment well   Behavior During Therapy [facility] for tasks assessed/performed      Past Medical History  Diagnosis Date  . Hypertension   . Hyperlipidemia   . Osteopenia   . Osteoporosis     Past Surgical History  Procedure Laterality Date  . Neck fusion      C-4, C-5 fusion  . Arthroscopic shoulder Left     Dr. Durward Fortes  . Arthroscopic knee Bilateral     Dr. Durward Fortes  . Cholecystectomy      Dr. Georgina Quint  . Appendectomy      Dr. Hassell Done  . Abdominal hysterectomy  1995    TVH--still has ovaries  . Bladder suspension    . Abdominal sacrocolpopexy  2007    Halban culdoplasty & Burch procedure--Dr. Quincy Simmonds  . Other surgical history  2011    posterior Colporrhaphy with Xenform biological graft--Dr. Quincy Simmonds  . Rectocele repair  2011    posterior colporrhaphy with Xenform biological graft--Dr. Quincy Simmonds  . Esophageal dilation    . Hip arthroplasty Left 11/07/2013    Procedure: ARTHROPLASTY BIPOLAR HIP;  Surgeon: Garald Balding, MD;  Location: [place];  Service: Orthopedics;  Laterality: Left;    There were no vitals filed for this visit.  Visit Diagnosis:  Pain of left scapula  Neck pain  Abnormal posture      Subjective Assessment - 05/01/15 1406    Subjective Patient reports she had some discomfort in bil. upper trap areas.     Pertinent History C4-5 is fused, Osteoporosis   Patient Stated Goals  reduce pain   Currently in Pain? Yes   Pain Score 4    Pain Location Scapula   Pain Orientation Left   Pain Descriptors / Indicators Aching   Pain Type Chronic pain   Pain Onset More than a month ago   Pain Frequency Intermittent   Aggravating Factors  sit with left arm propped up on chair, swimming   Pain Relieving Factors bring arm  to side, pressure on left scapula   Multiple Pain Sites No                         [facility] Adult PT Treatment/Exercise - 05/01/15 0001    Neck Exercises: Supine   Neck Retraction 10 reps;5 secs;Other (comment)  shoulder press   Neck Retraction Limitations lay on foam roll   Shoulder Flexion Right;Left;10 reps  head press, with tactile cues to elongate the spine   Other Supine Exercise decompression on foam roll 1 min.    Shoulder Exercises: ROM/Strengthening   UBE (Upper Arm Bike) L 1 x61mn(3/3)  tactile cues to extend the thoracic   Manual Therapy   Manual Therapy Soft tissue mobilization   Soft tissue mobilization left interscapular area, left upper trapezius in right sidely  PT Education - 05/01/15 1443    Education provided Yes   Education Details information on osteoporosis, scapular strengthening   Person(s) Educated Patient   Methods Explanation;Demonstration;Tactile cues;Verbal cues;Handout   Comprehension Returned demonstration;Verbalized understanding          PT Short Term Goals - 05/01/15 1446    PT SHORT TERM GOAL #1   Title walking on the beach with correct posture decreased >/= 25%   Time 3   Period Weeks   Status New  not met due ot just starting therapy   PT SHORT TERM GOAL #2   Title understand ways to elongate her cervical to manage her pain and improve posture   Time 3   Period Weeks   Status Achieved           PT Long Term Goals - 04/25/15 3568    PT LONG TERM GOAL #1   Title after walking on the beach with decreased pain >/= 75%   Time 6   Period Weeks   Status  New   PT LONG TERM GOAL #2   Title increased flexibility in cervical so ROM has improved by 25%   Time 6   Period Weeks   Status New   PT LONG TERM GOAL #3   Title improved postural awareness to decrease cervcial lordosis and thoracic kyphosis to have pain at manageable level   Time 6   Period Weeks   Status New               Plan - 05/01/15 1444    Clinical Impression Statement Patient is a 69 year old female with left scapular pain.  Patient was able to strengthen her interscapular area laying on a foam roll with decompression then soft tissue work that abolished her pain.  Patient has osteoporosis so she is educated on posture and  how to manage it. Patient has increase thoracic kyphosis putting a strain on her spine and muscles.    Pt will benefit from skilled therapeutic intervention in order to improve on the following deficits Decreased range of motion;Improper body mechanics;Postural dysfunction;Decreased activity tolerance;Increased fascial restricitons;Pain;Increased muscle spasms;Decreased mobility;Decreased strength   Rehab Potential Excellent   PT Frequency 2x / week   PT Duration 6 weeks   PT Treatment/Interventions Moist Heat;Therapeutic activities;Patient/family education;Passive range of motion;Therapeutic exercise;Ultrasound;Manual techniques;Neuromuscular re-education;Cryotherapy;Electrical Stimulation;Functional mobility training   PT Next Visit Plan modalities as needed, soft tissue work to left scapula area and cervical, postural education, left shoulder strengthening, cervical rom ex.    PT Home Exercise Plan current HEP   Consulted and Agree with Plan of Care Patient        Problem List Patient Active Problem List   Diagnosis Date Noted  . Osteoporosis, unspecified 08/03/2014  . Hyperlipidemia 11/06/2013  . Hypertension 11/06/2013  . Hip fracture, left 11/06/2013  . Environmental allergies 11/06/2013    Stephonie Wilcoxen,PT 05/01/2015, 2:47 PM  [facility] [address], [place], [place], [address] Phone: [phone]   Fax:  [phone]

## 2015-05-01 NOTE — Patient Instructions (Signed)
PNF Strengthening: Resisted   Lay on roll with head press  resistive band around each hand, bring right arm up and away, thumb back. Repeat _10___ times per set. Do _2___ sets per session. Do _1-2___ sessions per day.      Resisted Horizontal Abduction: Bilateral   Lay on foam roll with head press  tubing in both hands, arms out in front. Keeping arms straight, pinch shoulder blades together and stretch arms out. Repeat _10___ times per set. Do 2____ sets per session. Do _1-2___ sessions per day.   ROM: Flexion (Standing)   Lay on roll.  Press head into roll. Bring one arms straight out in front and raise as high as possible without pain. Keep palms facing up. Repeat _10___ times per set. Do __1__ sets per session. Do __1__ sessions per day. Alternate arms http://orth.exer.us/908   Copyright  VHI. All rights reserved.  Flexibility: Neck Retraction   Lay on foam roll. Press shoulders into roll Pull head straight back, keeping eyes and jaw level. Hold 5 seconds Repeat __5__ times per set. Do __1__ sets per session. Do __1__ sessions per day.  http://orth.exer.us/344   Copyright  VHI. All rights reserved.                                             DO's and DON'T's   Avoid and/or Minimize positions of forward bending ( flexion)  Side bending and rotation of the trunk  Especially when movements occur together   When your back aches:   Don't sit down   Lie down on your back with a small pillow under your head and one under your knees or as outlined by our therapist. Or, lie in the 90/90 position ( on the floor with your feet and legs on the sofa with knees and hips bent to 90 degrees)  Tying or putting on your shoes:   Don't bend over to tie your shoes or put on socks.  Instead, bring one foot up, cross it over the opposite knee and bend forward (hinge) at the hips to so the task.  Keep your back straight.  If you cannot do this safely, then you need to use long  handled assistive devices such as a shoehorn and sock puller.  Exercising:  Don't engage in ballistic types of exercise routines such as high-impact aerobics or jumping rope  Don't do exercises in the gym that bring you forward (abdominal crunches, sit-ups, touching your  toes, knee-to-chest, straight leg raising.)  Follow a regular exercise program that includes a variety of different weight-bearing activities, such as low-impact aerobics, T' ai chi or walking as your physical therapist advises  Do exercises that emphasize return to normal body alignment and strengthening of the muscles that keep your back straight, as outlined in this program or by your therapist  Household tasks:  Don't reach unnecessarily or twist your trunk when mopping, sweeping, vacuuming, raking, making beds, weeding gardens, getting objects ou of cupboards, etc.  Keep your broom, mop, vacuum, or rake close to you and mover your whole body as you move them. Walk over to the area on which you are working. Arrange kitchen, bathroom, and bedroom shelves so that frequently used items may be reached without excessive bending, twisting, and reaching.  Use a sturdy stool if necessary.  Don't bend from the waist to pick up something  up  Off the floor, out of the trunk of your car, or to brush your teeth, wash your face, etc.   Bend at the knees, keeping back straight as possible. Use a reacher if necessary.   Prevention of fracture is the so-called "BOTTOm -Line" in the management of OSTEOPOROSIS. Do not take unnecessary chances in movement. Once a compression fracture occurs, the process is very difficult to control; one fracture is frequently followed by many more.      Osteoporosis   What is Osteoporosis?  - A silent disease in which the skeleton is weakened by decreased bone density. - Characterized by low bone mass, deterioration of bone, and increased risk of fracture postmenopausal (primary) or the result of  an identifiable condition/event (secondary) - Commonly found in the wrists, spine, and hips; these are high-risk stress areas and very susceptible to fractures.  The Facts: - There are 1.5 million fractures/year o 500,000 spine; 250,000 hip with over 60,000 nursing home admissions secondary to hip fracture; and 200,000 wrist - After hip fracture, only 50% of people able to walk independently prior to the fracture return to independent ambulation. - Bone mass: Peaks at age 31-30, and begins declining at age 51-50.   Osteoporosis is defined by the [facility]) as:  NOF/WHO Criteria for Interpreting Results of Bone Density Assessment  Results Diagnosis  Within 1 standard deviation (SD) of young adult mean Normal  Between 1 and -2.5 SD below mean, repeat in 2 years Low bone mass (osteopenia)  Greater than -2.5 SD below mean Osteoporosis  Greater than -2.5 SD below mean and one or more fragility fractures exist Severe Osteoporosis  *Results can be affected by positioning of the body in the DEXA scan, presence of current or old fractures, arthritis, extraneous calcifications.    Osteoporosis is not just a women's disease!  - 30-40% of women will develop osteoporosis - 5-15% of males will develop osteoporosis   What are the risk factors?  1. Female 2. Thin, small frame 3. Caucasian, Asian race 4. Early menopause (<46 years old)/amenorrhea/delayed puberty 25. Old age 20. Family history (fractures, stooped posture)\ 7. Low calcium diet 8. Sedentary lifestyle 9. Alcohol, Caffeine, Smoking 10. Malnutrition, GI Disease 11. Prolonged use of Glucocorticoids (Prednisone), Meds to treat asthma, arthritis, cancers, thyroid, and anti-seizure meds.  How do I know for sure?  Get a BONE DENSITY TEST!  This measures bone loss and it's painless, non-invasive, and only takes 5-10 minutes!  What can I do about it?  ? Decrease your risk factors (alcohol, caffeine,  smoking) ? Helpful medications (see next page) ? Adequate Calcium and Vitamin D intake ? Get active! o Proper posture - Sit and stand tall! No slouching or twisting o Weight-Bearing Exercise - walking, stair climbing, elliptical; NO jogging or high-impact exercise. o Resistive Exercise - Cybex weight equipment, Nautilus, dumbbells, therabands  **Be sure to maintain proper alignment when lifting any weight!!  **When using equipment, avoid abdominal exercises which involve "crunching" or curling or twisting the trunk, biceps machines, cross-country machines, moving handlebars, or ANY MACHINE WITH ROTATION OR FORWARD BENDING!!!           Approved Pharmacologic Management of Osteoporosis  Agent Approved for prevention Approved for treatment BMD increased spine/hip Fracture reduction  Estrogen/Hormone Therapy (Estrace, Estratab, Ogen, Premarin, Vivell, Prempro, Femhert, Orthoest) Yes Yes 3-6% 35% spine and hip  Bisphosphonates  (Fosamax, Actonel, Boniva) Yes Yes 3-8% 35-50% spine and non-spine  Calcitonin (Miacalcin, Calcimar, Fortical) No Yes  0-3% None stated  Raloxifene (Evista) Yes Yes 2-3% 30-55%  Parathyroid Hormone (Forteo) No Yes, only in those at high risk for fracture None stated 53-65%     Recommended Daily Calcium Intakes   Population Group NIH/NOF* (mg elemental calcium)  Children 1-10 years [phone]  Children 11-24 years 1200-1500  Men and Women 25-64 years At least 1200  Pregnant/Lactating At least 1200  Postmenopausal women with hormone replacement therapy At least 1200  Postmenopausal women without   hormone replacement therapy At least 1200  Men and women 65 + At least 1200  *In 1987, 1990, 1994, and 2000, the NIH held consensus conferences on osteoporosis and calcium.  This column shows the most recent recommendations regarding calcium intak for preventing and managing osteoporosis.          Calcium Content of Selected Foods  Dairy Foods  Calcium Content (mg) Non-Dairy Foods Calcium Content (mg)  Buttermilk, 1 cup 300 Calcium-fortified juice, 1 cup 300  Milk, 1 cup 300 Salmon, canned with Bones, 2 oz 100  Lactaid milk, 1 cup 300-500 Oysters, raw 13-19 medium 226  Soy milk, 1 cup 200-300 Sardines, canned with bones, 3 oz 372  Yogurt (plain, lowfat) 1 cup 250-300 Shrimp, canned 3 oz 98  Frozen yogurt (fruit) 1 cup 200-600 Collard greens, cooked 1 cup 357  Cheddar, mozzarella, or [place]ster cheese, 1oz 205 Broccoli, cooked 1 cup 78  Cottage cheese (lowfat) 4 oz 200 Soybeans, cooked 1 cup 131  Part-skim ricotta cheese, 4oz 335 Tofu, 4oz* *  Vanilla ice cream, 1 cup 120-300    *Calcium content of tofu varies depending on processing method; check nutritional label on package for precise calcium content.     Suggested Guidelines for Calcium Supplement Use:  ? Calcium is absorbed most efficiently if taken in small amounts throughout the day.  Always divide the daily dose into smaller amounts if the total daily dose is 500mg  or more per day.  The body cannot use more than 500mg  Calcium at any one time. ? The use of manufactured supplements is encouraged.  Calcium as bone meal or dolomite may contain lead or other heavy metals as contaminants. ? Calcium supplements should not be taken with high fiber meals or with bulk forming laxatives. ? If calcium carbonate is used as the supplement form, it should be taken with meals to assure that stomach acid production is present to facilitate optimal dissolution and absorption of calcium.  This is important if atrophic gastritis with hypo- or achlorhydria is present, which it is in 20-50% of older individuals. ? It is important to drink plenty of fluids while using the supplement to help reduce problems with side effects like constipation or bloating.  If these symptoms become a problem, switching to another form of supplement may be the answer. ? Another alternative is  calcium-fortified foods, including fruit juices, cereals, and breads.  These foods are now marketed with added calcium and may be less likely to cause side effects. ? Those with personal or family histories of kidney stones should be monitored to assure that hypercalcuria does not occur. CALCIUM INTAKE QUIZ  Dairy products are the primary source of calcium for most people.  For a quick estimate of your daily calcium intake, complete the following steps:  1. Use the chart below to determine your daily intake of calcium from diary foods. Servings of dairy per day 1 2 3 4 5 6 7 8   Milligrams (mg) of calcium: 250 [phone] 1250  1500 1750 2000   2.  Enter your total daily calcium intake from dairy foods:     _____mg  3.  Add 350 mg, which is the average for all other dietary sources:                 +            350 mg  4.  The sum of your total daily calcium intake:               ______mg  5.  Enter the recommended calcium intake for your age from the chart below;         ______mg  6.  Enter your daily intake from step 4 above and subtract:                             -        _______mg  7.  The result is how much additional calcium you need:                                          ______mg      Recommended Daily Calcium Intake  Population Calcium (mg)  Children 1-10 years [phone]  Children 11-24 years 63-1500  Men and women 25-64 At least 1200  Pregnant/Lactating At least 1200  Postmenopausal women with hormone replacement therapy At least 1200  Postmenopausal women without hormone replacement therapy At least 1200  Men and women 65+ At least 1200       SAFETY TIPS FOR FALL PREVENTION   1. Remove throw rugs and make certain carpet edges are securely fastened to the floor.  2. Reduce clutter, especially in traffic areas of the home.   3. Install/maintain sturdy handrails at stairs.  4. Increase wattage of lighting in hallways, bathrooms, kitchens, stairwells, and  entrances to home.  5. Use night-lights near bed, in hallways, and in bathroom to improve night safety.  6. Install safety handrails in shower, tub, and around toilet.  Bathtubs and shower stalls should have non-skid surfaces.  7. When you must reach for something high, use a safety step stool, one with wide steps and a friction surface to stand on.  A type equipped with a high handrail is preferred.  8. If a cane or other walking aid has been recommended, use it to help increase your stability.  9. Wear supportive, cushioned, low-heeled shoes.  Avoid "scuffs" (backless bedroom slippers) and high heels.  10. Avoid rushing to answer a phone, doorbell, or anything else!  A portable phone that you can take from room to room with you is a good idea for security and safety.  11. Exercise regularly and stay active!!    [place] www.NOF.org   Exercise for Osteoporosis; A Safe and Effective Way to Build Bone Density and Muscle Strength By: Burnard Hawthorne, M.A.

## 2015-05-03 ENCOUNTER — Ambulatory Visit: Payer: Medicare Other | Admitting: Physical Therapy

## 2015-05-03 ENCOUNTER — Encounter: Payer: Self-pay | Admitting: Physical Therapy

## 2015-05-03 DIAGNOSIS — M5030 Other cervical disc degeneration, unspecified cervical region: Secondary | ICD-10-CM | POA: Diagnosis not present

## 2015-05-03 DIAGNOSIS — M898X1 Other specified disorders of bone, shoulder: Secondary | ICD-10-CM

## 2015-05-03 DIAGNOSIS — M542 Cervicalgia: Secondary | ICD-10-CM

## 2015-05-03 DIAGNOSIS — R293 Abnormal posture: Secondary | ICD-10-CM

## 2015-05-03 NOTE — Therapy (Signed)
[facility] [address] [place], [place], [address] Phone: [phone]   Fax:  [phone]  Physical Therapy Treatment  Patient Details  Name: Sarah [name] MRN: [id number] Date of Birth: March 03, 1946 Referring Provider:  Aletha Halim., PA-C  Encounter Date: 05/03/2015      PT End of Session - 05/03/15 1047    Visit Number 4   Number of Visits 10   Date for PT Re-Evaluation 06/06/15   PT Start Time 1016   PT Stop Time 1100   PT Time Calculation (min) 44 min   Activity Tolerance Patient tolerated treatment well   Behavior During Therapy [facility] for tasks assessed/performed      Past Medical History  Diagnosis Date  . Hypertension   . Hyperlipidemia   . Osteopenia   . Osteoporosis     Past Surgical History  Procedure Laterality Date  . Neck fusion      C-4, C-5 fusion  . Arthroscopic shoulder Left     Dr. Durward Fortes  . Arthroscopic knee Bilateral     Dr. Durward Fortes  . Cholecystectomy      Dr. Georgina Quint  . Appendectomy      Dr. Hassell Done  . Abdominal hysterectomy  1995    TVH--still has ovaries  . Bladder suspension    . Abdominal sacrocolpopexy  2007    Halban culdoplasty & Burch procedure--Dr. Quincy Simmonds  . Other surgical history  2011    posterior Colporrhaphy with Xenform biological graft--Dr. Quincy Simmonds  . Rectocele repair  2011    posterior colporrhaphy with Xenform biological graft--Dr. Quincy Simmonds  . Esophageal dilation    . Hip arthroplasty Left 11/07/2013    Procedure: ARTHROPLASTY BIPOLAR HIP;  Surgeon: Garald Balding, MD;  Location: [facility];  Service: Orthopedics;  Laterality: Left;    There were no vitals filed for this visit.  Visit Diagnosis:  Pain of left scapula  Neck pain  Abnormal posture      Subjective Assessment - 05/03/15 1026    Subjective It is not spasm anymore, but it is sore at times   Currently in Pain? No/denies  sometimes sorness   Multiple Pain Sites No                          [facility] Adult PT Treatment/Exercise - 05/03/15 0001    Neck Exercises: Theraband   Scapula Retraction 20 reps;10 reps  5sec hold, while on foam roll   Neck Exercises: Supine   Neck Retraction 10 reps;5 secs;Other (comment)   Neck Retraction Limitations laying on faom roll on floor   Shoulder Flexion Right;Left;10 reps;Other (comment)  while on foam roll   Other Supine Exercise Pt owns foam roll and we practiced how to go down safely   we practiced with 2 chairs to assist with transfer   Shoulder Exercises: Standing   Flexion AROM;Both;Other (comment)  foam roll vertical & horizontal behind back 2 x10 each   Shoulder Exercises: ROM/Strengthening   UBE (Upper Arm Bike) L3 x6 (3/3)                  PT Short Term Goals - 05/03/15 1207    PT SHORT TERM GOAL #1   Title walking on the beach with correct posture decreased >/= 25%   Time 3   Period Weeks   Status On-going   PT SHORT TERM GOAL #2   Title understand ways to elongate her  cervical to manage her pain and improve posture   Time 3   Period Weeks   Status Achieved           PT Long Term Goals - 05/03/15 1207    PT LONG TERM GOAL #1   Title after walking on the beach with decreased pain >/= 75%   Time 6   Period Weeks   Status On-going   PT LONG TERM GOAL #2   Title increased flexibility in cervical so ROM has improved by 25%   Time 6   Period Weeks   Status On-going   PT LONG TERM GOAL #3   Title improved postural awareness to decrease cervcial lordosis and thoracic kyphosis to have pain at manageable level   Time 6   Period Weeks   Status On-going               Plan - 05/03/15 1210    Clinical Impression Statement Pt reports she bought foam roll to practice at home, but it is challenging for her to go down and up from floor, Practiced at clinic using chairs and patient able to perform safely   Rehab Potential Excellent   PT Frequency 2x / week   PT  Duration 6 weeks   PT Treatment/Interventions Moist Heat;Therapeutic activities;Patient/family education;Passive range of motion;Therapeutic exercise;Ultrasound;Manual techniques;Neuromuscular re-education;Cryotherapy;Electrical Stimulation;Functional mobility training   PT Next Visit Plan modalities as needed, soft tissue work to left scapula area and cervical, postural education, left shoulder strengthening, cervical rom ex.    Consulted and Agree with Plan of Care Patient        Problem List Patient Active Problem List   Diagnosis Date Noted  . Osteoporosis, unspecified 08/03/2014  . Hyperlipidemia 11/06/2013  . Hypertension 11/06/2013  . Hip fracture, left 11/06/2013  . Environmental allergies 11/06/2013    NAUMANN-HOUEGNIFIO,Ruford Dudzinski PTA 05/03/2015, 12:13 PM  [facility] [address] [place], [place], [address] Phone: [phone]   Fax:  [phone]

## 2015-05-08 ENCOUNTER — Encounter: Payer: Self-pay | Admitting: Physical Therapy

## 2015-05-08 ENCOUNTER — Ambulatory Visit: Payer: Medicare Other | Admitting: Physical Therapy

## 2015-05-08 DIAGNOSIS — R293 Abnormal posture: Secondary | ICD-10-CM

## 2015-05-08 DIAGNOSIS — M898X1 Other specified disorders of bone, shoulder: Secondary | ICD-10-CM

## 2015-05-08 DIAGNOSIS — M5030 Other cervical disc degeneration, unspecified cervical region: Secondary | ICD-10-CM | POA: Diagnosis not present

## 2015-05-08 DIAGNOSIS — M542 Cervicalgia: Secondary | ICD-10-CM

## 2015-05-08 NOTE — Therapy (Signed)
[facility] [address] [place], [place], [address] Phone: [phone]   Fax:  [phone]  Physical Therapy Treatment  Patient Details  Name: Sarah [name] MRN: [id number] Date of Birth: 1946/04/09 Referring Provider:  Garald Balding, MD  Encounter Date: 05/08/2015      PT End of Session - 05/08/15 0902    Visit Number 5   Number of Visits 10  Medicare   Date for PT Re-Evaluation 06/06/15   PT Start Time 0845   PT Stop Time 0930   PT Time Calculation (min) 45 min   Activity Tolerance Patient tolerated treatment well   Behavior During Therapy [facility] for tasks assessed/performed      Past Medical History  Diagnosis Date  . Hypertension   . Hyperlipidemia   . Osteopenia   . Osteoporosis     Past Surgical History  Procedure Laterality Date  . Neck fusion      C-4, C-5 fusion  . Arthroscopic shoulder Left     Dr. Durward Fortes  . Arthroscopic knee Bilateral     Dr. Durward Fortes  . Cholecystectomy      Dr. Georgina Quint  . Appendectomy      Dr. Hassell Done  . Abdominal hysterectomy  1995    TVH--still has ovaries  . Bladder suspension    . Abdominal sacrocolpopexy  2007    Halban culdoplasty & Burch procedure--Dr. Quincy Simmonds  . Other surgical history  2011    posterior Colporrhaphy with Xenform biological graft--Dr. Quincy Simmonds  . Rectocele repair  2011    posterior colporrhaphy with Xenform biological graft--Dr. Quincy Simmonds  . Esophageal dilation    . Hip arthroplasty Left 11/07/2013    Procedure: ARTHROPLASTY BIPOLAR HIP;  Surgeon: Garald Balding, MD;  Location: [facility];  Service: Orthopedics;  Laterality: Left;    There were no vitals filed for this visit.  Visit Diagnosis:  Neck pain  Pain of left scapula  Abnormal posture      Subjective Assessment - 05/08/15 0855    Subjective Patient reports 1 spasm on Thursday or Friday after she spent the day mopping and sweeping the floors. Neck pain and stiffness  improved by 80%.    Pertinent History C4-5 is fused, Osteoporosis   Patient Stated Goals reduce pain   Currently in Pain? No/denies            [facility] PT Assessment - 05/08/15 0001    AROM   Left Shoulder Flexion 160 Degrees   Cervical Flexion full   Cervical Extension decreased by 50%   Cervical - Right Side Bend full   Cervical - Left Side Bend full   Cervical - Right Rotation decreased by 50%   Cervical - Left Rotation decreased by 25%                     [facility] Adult PT Treatment/Exercise - 05/08/15 0001    Neck Exercises: Supine   Shoulder Flexion Right;Left;10 reps;Other (comment)  while on foam roll   Shoulder Flexion Weights (lbs) 1   Shoulder ABduction Both;10 reps;Other reps (comment)  laying on foam roll   Shoulder Abduction Weights (lbs) 1   Upper Extremity D2 10 reps;Weights;Other (comment)  lay on foam roll   UE D2 Weights (lbs) 1   Other Supine Exercise supine on foam roll with yellow band making Y movement overhead 10 times   Shoulder Exercises: Prone   Retraction Strengthening;10 reps;Limitations  prone on elbows  Retraction Limitations difficulty with retraction against resistance   Shoulder Exercises: ROM/Strengthening   UBE (Upper Arm Bike) L3 x6 (3/3)   Manual Therapy   Manual Therapy Soft tissue mobilization   Soft tissue mobilization suboccipital and cervical paraspinals                  PT Short Term Goals - 05/08/15 0904    PT SHORT TERM GOAL #1   Title walking on the beach with correct posture decreased >/= 25%   Time 3   Period Weeks   Status On-going  has not walked on the beach yet           PT Long Term Goals - 05/08/15 0904    PT LONG TERM GOAL #1   Title after walking on the beach with decreased pain >/= 75%   Time 6   Period Weeks   Status On-going  Not on beach yet   PT LONG TERM GOAL #2   Title increased flexibility in cervical so ROM has improved by 25%   Time 6   Period Weeks   Status  Achieved   PT LONG TERM GOAL #3   Title improved postural awareness to decrease cervcial lordosis and thoracic kyphosis to have pain at manageable level   Time 6   Period Weeks   Status Achieved               Plan - 05/08/15 6283    Clinical Impression Statement Patient has improved cervical range of motion by 25%.  Patient is able to stand with erect posture with forward head improved by 50%.  Patient is exercising to increased cervical thoracic strength to hold her head up while looking for shells on the beach.  Patient  gained an extra 25% of right cervical rotation aftera soft tissue work.  Patient has met LTG #2.  Patient will benefit from physical therapy to improved cervical thoracic strength and reduce trigger points in cervical on righ tside.    Pt will benefit from skilled therapeutic intervention in order to improve on the following deficits Decreased range of motion;Improper body mechanics;Postural dysfunction;Decreased activity tolerance;Increased fascial restricitons;Pain;Increased muscle spasms;Decreased mobility;Decreased strength   Rehab Potential Excellent   PT Frequency 2x / week   PT Duration 6 weeks   PT Treatment/Interventions Moist Heat;Therapeutic activities;Patient/family education;Passive range of motion;Therapeutic exercise;Ultrasound;Manual techniques;Neuromuscular re-education;Cryotherapy;Electrical Stimulation;Functional mobility training   PT Next Visit Plan soft tissue work to cervical, strengthening; See how patient did with looking for shells   PT Home Exercise Plan current HEP   Consulted and Agree with Plan of Care Patient        Problem List Patient Active Problem List   Diagnosis Date Noted  . Osteoporosis, unspecified 08/03/2014  . Hyperlipidemia 11/06/2013  . Hypertension 11/06/2013  . Hip fracture, left 11/06/2013  . Environmental allergies 11/06/2013    Verdis Bassette,PT 05/08/2015, 9:28 AM  [facility] [address] [place], [place], [address] Phone: [phone]   Fax:  [phone]

## 2015-05-10 ENCOUNTER — Encounter: Payer: Medicare Other | Admitting: Physical Therapy

## 2015-05-12 ENCOUNTER — Ambulatory Visit: Payer: Self-pay | Admitting: Gastroenterology

## 2015-05-17 ENCOUNTER — Encounter: Payer: Medicare Other | Admitting: Physical Therapy

## 2015-05-19 ENCOUNTER — Encounter: Payer: Medicare Other | Admitting: Physical Therapy

## 2015-05-22 ENCOUNTER — Ambulatory Visit: Payer: Medicare Other | Attending: Orthopaedic Surgery | Admitting: Physical Therapy

## 2015-05-22 DIAGNOSIS — M542 Cervicalgia: Secondary | ICD-10-CM | POA: Insufficient documentation

## 2015-05-22 DIAGNOSIS — R293 Abnormal posture: Secondary | ICD-10-CM

## 2015-05-22 NOTE — Therapy (Signed)
[facility] [address] [place], [place], [address] Phone: [phone]   Fax:  [phone]  Physical Therapy Treatment  Patient Details  Name: Sarah [name] MRN: [id number] Date of Birth: 11-19-1946 Referring Provider:  Garald Balding, MD  Encounter Date: 05/22/2015      PT End of Session - 05/22/15 0901    Visit Number 6   Date for PT Re-Evaluation 06/06/15   PT Start Time 0845   PT Stop Time 0935   PT Time Calculation (min) 50 min   Activity Tolerance Patient tolerated treatment well   Behavior During Therapy [facility] for tasks assessed/performed      Past Medical History  Diagnosis Date  . Hypertension   . Hyperlipidemia   . Osteopenia   . Osteoporosis     Past Surgical History  Procedure Laterality Date  . Neck fusion      C-4, C-5 fusion  . Arthroscopic shoulder Left     Dr. Durward Fortes  . Arthroscopic knee Bilateral     Dr. Durward Fortes  . Cholecystectomy      Dr. Georgina Quint  . Appendectomy      Dr. Hassell Done  . Abdominal hysterectomy  1995    TVH--still has ovaries  . Bladder suspension    . Abdominal sacrocolpopexy  2007    Halban culdoplasty & Burch procedure--Dr. Quincy Simmonds  . Other surgical history  2011    posterior Colporrhaphy with Xenform biological graft--Dr. Quincy Simmonds  . Rectocele repair  2011    posterior colporrhaphy with Xenform biological graft--Dr. Quincy Simmonds  . Esophageal dilation    . Hip arthroplasty Left 11/07/2013    Procedure: ARTHROPLASTY BIPOLAR HIP;  Surgeon: Garald Balding, MD;  Location: [facility];  Service: Orthopedics;  Laterality: Left;    There were no vitals filed for this visit.  Visit Diagnosis:  Neck pain  Abnormal posture      Subjective Assessment - 05/22/15 0849    Subjective No spasms while walking in the beach for 4 miles.  I had tightenss between shoulder blades. I veel the neck began when I was driving in a car for long hours.  Need tips to protect neck in  ca.    Pertinent History C4-5 is fused, Osteoporosis   Patient Stated Goals reduce pain   Currently in Pain? No/denies            [facility] PT Assessment - 05/22/15 0001    Assessment   Medical Diagnosis neck, left scapula, cervical DDD, left shoulder pain   Onset Date/Surgical Date 11/16/15   Precautions   Precautions Other (comment)   Precaution Comments No joint mobilization   Prior Function   Level of Independence Independent with basic ADLs   AROM   Cervical Extension full   Cervical - Right Side Bend full   Cervical - Left Side Bend full   Cervical - Right Rotation decreased by 25%   Cervical - Left Rotation decreased by 25%                     [facility] Adult PT Treatment/Exercise - 05/22/15 0001    Neck Exercises: Seated   Neck Retraction 5 reps;3 secs   Cervical Rotation Both;5 reps   Lateral Flexion Both;5 reps   Neck Exercises: Supine   Neck Retraction 10 reps;5 secs;Other (comment)   Neck Retraction Limitations laying on faom roll on floor   Shoulder Flexion Right;Left;10 reps;Other (comment)  while on foam roll  Shoulder Flexion Weights (lbs) 1   Shoulder ABduction Both;10 reps;Other reps (comment)  laying on foam roll   Shoulder Abduction Weights (lbs) 1   Upper Extremity D2 10 reps;Weights;Other (comment)  lay on foam roll   UE D2 Weights (lbs) 1   Other Supine Exercise supine on foam roll with yellow band making Y movement overhead 10 times   Shoulder Exercises: Seated   Other Seated Exercises facilitate kayak paddling with red theraband 20 x each arm   Manual Therapy   Manual Therapy Soft tissue mobilization   Soft tissue mobilization suboccipital and cervical paraspinals                PT Education - June 21, 2015 0936    Education provided Yes   Education Details reviewed HEP; kayak facilitating exercise with red theraband; cervical ROM exercises; scapula strengthening on foam roll   Person(s) Educated Patient   Methods  Explanation;Demonstration;Tactile cues;Verbal cues;Handout   Comprehension Returned demonstration;Verbalized understanding          PT Short Term Goals - 06-21-2015 0851    PT SHORT TERM GOAL #1   Title walking on the beach with correct posture decreased >/= 25%   Time 3   Period Weeks   Status Achieved   PT SHORT TERM GOAL #2   Title understand ways to elongate her cervical to manage her pain and improve posture   Time 3   Period Weeks   Status Achieved           PT Long Term Goals - 21-Jun-2015 4917    PT LONG TERM GOAL #1   Title after walking on the beach with decreased pain >/= 75%   Time 6   Period Weeks   Status Achieved   PT LONG TERM GOAL #2   Title increased flexibility in cervical so ROM has improved by 25%   Time 6   Period Weeks   Status Achieved   PT LONG TERM GOAL #3   Title improved postural awareness to decrease cervcial lordosis and thoracic kyphosis to have pain at manageable level   Time 6   Period Weeks   Status Achieved               Plan - 21-Jun-2015 0937    Clinical Impression Statement Patient is a 69 year old femalw with cervical pain.  Patient reports no cervical pain just tightness in interscapular area.  Patient posture has improve by 90% with standing erect. Patient has full cervical ROM with exception of rotation bil. by 25%.  Patient is independent with her HEP.  Patient able to walk on the beach without cervical pain.  Patient FOTO score has improved to 25% limitaiton. Patient has met all of her goals for physical therapy.    Pt will benefit from skilled therapeutic intervention in order to improve on the following deficits Decreased range of motion;Improper body mechanics;Postural dysfunction;Decreased activity tolerance;Increased fascial restricitons;Pain;Increased muscle spasms;Decreased mobility;Decreased strength   Rehab Potential Excellent   Clinical Impairments Affecting Rehab Potential None   PT Treatment/Interventions Moist  Heat;Therapeutic activities;Patient/family education;Passive range of motion;Therapeutic exercise;Ultrasound;Manual techniques;Neuromuscular re-education;Cryotherapy;Electrical Stimulation;Functional mobility training   PT Next Visit Plan discharge to [facility] current HEP   Recommended Other Services None   Consulted and Agree with Plan of Care Patient          G-Codes - Jun 21, 2015 0906    Functional Assessment Tool Used FOTO score is 29% limitation   Functional  Limitation Other PT primary   Other PT Primary Goal Status (Z3582) At least 20 percent but less than 40 percent impaired, limited or restricted   Other PT Primary Discharge Status [phone]) At least 20 percent but less than 40 percent impaired, limited or restricted      Problem List Patient Active Problem List   Diagnosis Date Noted  . Osteoporosis, unspecified 08/03/2014  . Hyperlipidemia 11/06/2013  . Hypertension 11/06/2013  . Hip fracture, left 11/06/2013  . Environmental allergies 11/06/2013    GRAY,CHERYL,PT 05/22/2015, 9:42 AM  [facility] [address] [place], [place], [address] Phone: [phone]   Fax:  [phone]   PHYSICAL THERAPY DISCHARGE SUMMARY  Visits from Start of Care: 6 Current functional level related to goals / functional outcomes: See above for update on goals.  Patient has met all of her goals.    Remaining deficits: None   Education / Equipment: HEP  Plan: Patient agrees to discharge.  Patient goals were met. Patient is being discharged due to meeting the stated rehab goals.  Thank you for the referral. Earlie Counts, PT 05/22/2015 9:42 AM ?????

## 2015-05-22 NOTE — Patient Instructions (Addendum)
  PNF Strengthening: Resisted   Sitting or lay on back,  with resistive band around each hand, bring right arm up and away, thumb back. Repeat _10___ times per set. Do _2___ sets per session. Do _1-2___ sessions per day.  Do Y movement of arms while sitting or laying on back 10 time 1 time per day.    Resisted Horizontal Abduction: Bilateral   Sit or lay on back, tubing in both hands, arms out in front. Keeping arms straight, pinch shoulder blades together and stretch arms out. Repeat _10___ times per set. Do 2____ sets per session. Do _1-2___ sessions per day.  Scapular Retraction: Elbow Flexion (Standing)   With elbows bent to 90, pinch shoulder blades together and rotate arms out, keeping elbows bent. Retract head at same time. Repeat _10___ times per set. Do _1___ sets per session. Do many____ sessions per day.    Strengthening: Resisted Extension   Hold tubing in right hand, arm forward. Pull arm back, elbow straight. Repeat _10___ times per set. Do _2___ sets per session. Do _1-2___ sessions per day.  Can place band around the front of your body and perform with both arms at the same time.  Kayak paddling exercise 20 times each arm in sitting daily.  Limit trunk rotation and keep shoulders away from ears.   AROM: Neck Rotation   Turn head slowly to look over one shoulder, then the other. Hold each position _3___ seconds. Repeat __3__ times per set. Do _1___ sets per session. Do 1____ sessions per day.  http://orth.exer.us/294   Copyright  VHI. All rights reserved.  AROM: Neck Extension  Retract head first.  Bend head backward. Hold __1__ seconds. Repeat __3__ times per set. Do __1__ sets per session. Do __1__ sessions per day.  http://orth.exer.us/300   Copyright  VHI. All rights reserved.  AROM: Lateral Neck Flexion   Slowly tilt head toward one shoulder, then the other. Hold each position _3___ seconds. Repeat __3__ times per set. Do __1__ sets per  session. Do __1__ sessions per day.  http://orth.exer.us/296   Copyright  VHI. All rights reserved.  AROM: Lateral Neck Flexion   Slowly tilt head toward one shoulder, then the other. Hold each position ____ seconds. Repeat ____ times per set. Do ____ sets per session. Do ____ sessions per day.  http://orth.exer.us/296   Copyright  VHI. All rights reserved.  Flexibility: Neck Retraction  Can do against the back of the car seat. Pull head straight back, keeping eyes and jaw level. Repeat __3__ times per set. Do __1__ sets per session. Do _1___ sessions per day.  http://orth.exer.us/344   Copyright  VHI. All rights reserved.   [facility] [address], [place], [place] [address] Phone # [phone] Fax [phone]

## 2015-05-24 ENCOUNTER — Encounter: Payer: Medicare Other | Admitting: Physical Therapy

## 2015-06-12 ENCOUNTER — Other Ambulatory Visit: Payer: Self-pay | Admitting: Orthopaedic Surgery

## 2015-06-12 DIAGNOSIS — M542 Cervicalgia: Secondary | ICD-10-CM

## 2015-06-13 ENCOUNTER — Other Ambulatory Visit: Payer: Self-pay | Admitting: Family Medicine

## 2015-06-13 DIAGNOSIS — R935 Abnormal findings on diagnostic imaging of other abdominal regions, including retroperitoneum: Secondary | ICD-10-CM

## 2015-06-16 ENCOUNTER — Ambulatory Visit
Admission: RE | Admit: 2015-06-16 | Discharge: 2015-06-16 | Disposition: A | Discharge status: Home / Self Care | Payer: Medicare Other | Source: Ambulatory Visit | Attending: Family Medicine | Admitting: Family Medicine

## 2015-06-16 DIAGNOSIS — R935 Abnormal findings on diagnostic imaging of other abdominal regions, including retroperitoneum: Secondary | ICD-10-CM

## 2015-06-22 ENCOUNTER — Ambulatory Visit
Admission: RE | Admit: 2015-06-22 | Discharge: 2015-06-22 | Disposition: A | Discharge status: Home / Self Care | Payer: Medicare Other | Source: Ambulatory Visit | Attending: Orthopaedic Surgery | Admitting: Orthopaedic Surgery

## 2015-06-22 DIAGNOSIS — M542 Cervicalgia: Secondary | ICD-10-CM

## 2015-06-23 ENCOUNTER — Other Ambulatory Visit: Payer: Self-pay | Admitting: Obstetrics and Gynecology

## 2015-06-23 NOTE — Telephone Encounter (Signed)
06/23/15  Pt was given a call, message was left on voicemail to call our office back.

## 2015-06-23 NOTE — Telephone Encounter (Signed)
Please contact patient about her Rx for the vaginal estrogen cream.  Has she done a mammogram this year? Any changes in her health since I saw her last?

## 2015-06-23 NOTE — Telephone Encounter (Signed)
Medication refill request: Premarin cream  Last AEX:  08/02/2013 with BS Next AEX: 09/13/15 with BS Last MMG (if hormonal medication request): 08/25/2013 Bi-rads/C 1 neg. Refill authorized: Please advise.

## 2015-06-26 ENCOUNTER — Encounter: Payer: Self-pay | Admitting: Obstetrics and Gynecology

## 2015-06-26 ENCOUNTER — Other Ambulatory Visit: Payer: Self-pay

## 2015-06-26 DIAGNOSIS — Z1231 Encounter for screening mammogram for malignant neoplasm of breast: Secondary | ICD-10-CM

## 2015-06-26 NOTE — Telephone Encounter (Signed)
Patient stated that she had a mammogram last year and will call the imaging center to send results.  Patient does not have any changes in health since her last visit.

## 2015-07-10 ENCOUNTER — Ambulatory Visit (INDEPENDENT_AMBULATORY_CARE_PROVIDER_SITE_OTHER): Payer: Medicare Other | Admitting: Gastroenterology

## 2015-07-10 ENCOUNTER — Encounter: Payer: Self-pay | Admitting: Gastroenterology

## 2015-07-10 VITALS — BP 130/74 | HR 68 | Ht 64.0 in | Wt 147.5 lb

## 2015-07-10 DIAGNOSIS — R1310 Dysphagia, unspecified: Secondary | ICD-10-CM | POA: Diagnosis not present

## 2015-07-10 DIAGNOSIS — K219 Gastro-esophageal reflux disease without esophagitis: Secondary | ICD-10-CM | POA: Diagnosis not present

## 2015-07-10 NOTE — Progress Notes (Signed)
Review of pertinent gastrointestinal problems: 1. Routine risk for colon cancer: Colonoscopy Dr. Velora Heckler for "routine screening" done 02/2006 found left sided diverticulosis, hemorrhoids.  No polyps. He recommended she have annual hemocult testing (?) and repeat colonoscopy in 8 years (?). Colonoscopy Dr. Velora Heckler 2001 for screening; no polyps were found, recommended repeat colonoscopy in 5-10 years. 2. "esopahgeal spasm" led to EGD Dr. Velora Heckler 07/2000: found "tight crico" and early stricture in esophagus, dilated with 50 maloney   HPI: This is a   very pleasant 69 year old woman who was referred to me by Aletha Halim., PA-C  to evaluate  GERD, dysphasia .    Chief complaint is GERD, intermittent dysphagia  She has GERD, also allergies.  She stopped zyrtec recently completely (new research suggesting trouble for older patients).  She knows she has post nasal drip.  Had neck MRI (arthritis, spinal stenosis, bulging of discs).  She has weaned to 1 OTC nexium once daily (previously was taking 40mg  once daily).  1-2 times per week she skips the nexium and takes H2 blocker that day.  Sometimes when she eats she has dysphagia; usually breads.  Not progressive.  Can be as often as daily.  Sticks momentarily.  Will always go down.   She feels that this has been gradually worsening as she has tapered from her antiacid medicines a bit. Also since she stopped her 0 tach  Weight has been stable.  The antiacid regimen works for her pyrosis for the most part.  Drinks one glass of wine daily.  Avoids caffeine.  Review of systems: Pertinent positive and negative review of systems were noted in the above HPI section. Complete review of systems was performed and was otherwise normal.   Past Medical History  Diagnosis Date  . Hypertension   . Hyperlipidemia   . Osteopenia   . Osteoporosis   . Arthritis   . Asthma   . Diverticulosis   . Diverticulitis   . Status post dilation of esophageal  narrowing     Past Surgical History  Procedure Laterality Date  . Cervical fusion      C-4, C-5 fusion  . Arthroscopic shoulder Left     Dr. Durward Fortes, bone spur  . Knee arthroscopy Bilateral     Dr. Durward Fortes  . Cholecystectomy      Dr. Georgina Quint  . Appendectomy      Dr. Hassell Done  . Abdominal hysterectomy  1995    TVH--still has ovaries  . Bladder suspension    . Abdominal sacrocolpopexy  2007    Halban culdoplasty & Burch procedure--Dr. Quincy Simmonds  . Other surgical history  2011    posterior Colporrhaphy with Xenform biological graft--Dr. Quincy Simmonds  . Rectocele repair  2011    posterior colporrhaphy with Xenform biological graft--Dr. Quincy Simmonds  . Esophageal dilation    . Hip arthroplasty Left 11/07/2013    Procedure: ARTHROPLASTY BIPOLAR HIP;  Surgeon: Garald Balding, MD;  Location: [facility];  Service: Orthopedics;  Laterality: Left;    Current Outpatient Prescriptions  Medication Sig Dispense Refill  . acetaminophen (TYLENOL) 325 MG tablet Take 2 tablets (650 mg total) by mouth every 6 (six) hours as needed for mild pain (or Fever >/= 101).    . beta carotene w/minerals (OCUVITE) tablet Take 1 tablet by mouth daily.    . Calcium Citrate (CITRACAL PO) Take 2 tablets by mouth daily.    . cetirizine (ZYRTEC) 10 MG tablet Take 10 mg by mouth daily.    Marland Kitchen  cholecalciferol (D-VI-SOL) 400 UNIT/ML LIQD Take 400 Units by mouth daily.    . clobetasol ointment (TEMOVATE) 2.50 % Apply 1 application topically 2 (two) times a week. 30 g 1  . Coenzyme Q10 (COQ10) 200 MG CAPS Take 1 tablet by mouth daily.    Marland Kitchen DIOVAN 80 MG tablet Take 80 mg by mouth daily.     Marland Kitchen docusate sodium (COLACE) 100 MG capsule Take 100 mg by mouth as needed for constipation.    Marland Kitchen EPIPEN 2-PAK 0.3 MG/0.3ML SOAJ injection Inject 1 mg into the muscle as needed.    . fish oil-omega-3 fatty acids 1000 MG capsule Take 2 g by mouth daily.    . fluticasone (FLONASE) 50 MCG/ACT nasal spray Place 50 sprays into the nose daily.    Marland Kitchen  lactobacillus acidophilus (BACID) TABS tablet Take 2 tablets by mouth 2 (two) times a week.    . Multiple Vitamin (MULTIVITAMIN) capsule Take 1 capsule by mouth daily.    Marland Kitchen NEXIUM 40 MG capsule Take 1 capsule by mouth daily.    . Olopatadine HCl (PATANASE) 0.6 % SOLN Place 1 puff into the nose as needed.    Marland Kitchen PREMARIN vaginal cream APPLY 1/2 GM VAGINALLY 2 TIMES A WEEK AS NEEDED TO MAINTAIN SYMPTOM RELIEF 30 g 0  . simvastatin (ZOCOR) 10 MG tablet Take 1 tablet by mouth daily.    . vitamin B-12 (CYANOCOBALAMIN) 1000 MCG tablet Take 1,000 mcg by mouth daily.    . Vitamin Mixture (ESTER-C) 500-60 MG TABS Take 1 capsule by mouth daily.     No current facility-administered medications for this visit.    Allergies as of 07/10/2015 - Review Complete 07/10/2015  Allergen Reaction Noted  . Evista [raloxifene]  10/21/2013    Family History  Problem Relation Age of Onset  . Hypertension Mother   . Diabetes Father   . Hypertension Father   . Diabetes Sister   . Hypertension Sister   . Thyroid disease Sister   . Heart disease Father   . Heart disease Sister   . Throat cancer Paternal Uncle     smoker  . Lung cancer Cousin     smoker    History   Social History  . Marital Status: Married    Spouse Name: N/A  . Number of Children: 1  . Years of Education: N/A   Occupational History  . retired    Social History Main Topics  . Smoking status: Never Smoker   . Smokeless tobacco: Never Used  . Alcohol Use: 0.0 oz/week    0 Standard drinks or equivalent per week     Comment: 7 glasses of wine per week  . Drug Use: No  . Sexual Activity: Yes    Birth Control/ Protection: Other-see comments     Comment: TVH--Still has ovaries   Other Topics Concern  . Not on file   Social History Narrative     Physical Exam: Ht 5\' 4"  (1.626 m)  Wt 147 lb 8 oz (66.906 kg)  BMI 25.31 kg/m2 Constitutional: generally well-appearing Psychiatric: alert and oriented x3 Eyes: extraocular  movements intact Mouth: oral pharynx moist, no lesions Neck: supple no lymphadenopathy Cardiovascular: heart regular rate and rhythm Lungs: clear to auscultation bilaterally Abdomen: soft, nontender, nondistended, no obvious ascites, no peritoneal signs, normal bowel sounds Extremities: no lower extremity edema bilaterally Skin: no lesions on visible extremities   Assessment and plan: 69 y.o. female with  GERD, intermittent dysphagia  She is not losing  weight. Her dysphasia symptoms have started since she stopped her allergy medicine and has been cutting down on proton pump inhibitor. I explained to her that these might be related. She has some spinal neck issues and she will be sitting down the spine surgeon later this week. I did explain to her that she could back further on her antiacid medicines since she is concerned about potential risks of wrote on pump inhibitors however she may have worsening dysphagia, GERD as she does. I offered upper endoscopy but said it was not urgent but we did agree that she would proceed with barium esophagram for initial dilation of her dysphasia.   Owens Loffler, MD [facility] 07/10/2015, 8:29 AM  Cc: Aletha Halim., PA-C

## 2015-07-10 NOTE — Patient Instructions (Addendum)
Recall colonoscopy 02/2016 for routine risk screening. Taper from antiacid medicines slowly, as you wish. Barium esophagram for your dysphagia.  You have been scheduled for a Barium Esophogram at [facility] (1st floor of the hospital) on 07/13/15 at 1030 am. Please arrive 15 minutes prior to your appointment for registration. Make certain not to have anything to eat or drink 6 hours prior to your test. If you need to reschedule for any reason, please contact radiology at [phone] to do so. __________________________________________________________________ A barium swallow is an examination that concentrates on views of the esophagus. This tends to be a double contrast exam (barium and two liquids which, when combined, create a gas to distend the wall of the oesophagus) or single contrast (non-ionic iodine based). The study is usually tailored to your symptoms so a good history is essential. Attention is paid during the study to the form, structure and configuration of the esophagus, looking for functional disorders (such as aspiration, dysphagia, achalasia, motility and reflux) EXAMINATION You may be asked to change into a gown, depending on the type of swallow being performed. A radiologist and radiographer will perform the procedure. The radiologist will advise you of the type of contrast selected for your procedure and direct you during the exam. You will be asked to stand, sit or lie in several different positions and to hold a small amount of fluid in your mouth before being asked to swallow while the imaging is performed .In some instances you may be asked to swallow barium coated marshmallows to assess the motility of a solid food bolus. The exam can be recorded as a digital or video fluoroscopy procedure. POST PROCEDURE It will take 1-2 days for the barium to pass through your system. To facilitate this, it is important, unless otherwise directed, to increase your fluids for the next  24-48hrs and to resume your normal diet.  This test typically takes about 30 minutes to perform. __________________________________________________________________________________ Call if your swallowing problems worsen.

## 2015-07-13 ENCOUNTER — Ambulatory Visit (HOSPITAL_COMMUNITY): Payer: Medicare Other

## 2015-07-20 ENCOUNTER — Ambulatory Visit (HOSPITAL_COMMUNITY): Payer: Medicare Other

## 2015-07-24 ENCOUNTER — Ambulatory Visit (HOSPITAL_COMMUNITY): Payer: Medicare Other

## 2015-07-26 ENCOUNTER — Ambulatory Visit
Admission: RE | Admit: 2015-07-26 | Discharge: 2015-07-26 | Disposition: A | Discharge status: Home / Self Care | Payer: Medicare Other | Source: Ambulatory Visit

## 2015-07-26 DIAGNOSIS — Z1231 Encounter for screening mammogram for malignant neoplasm of breast: Secondary | ICD-10-CM

## 2015-09-12 LAB — HEPATIC FUNCTION PANEL
ALT: 17 U/L (ref 7–35)
AST: 19 U/L (ref 13–35)
Alkaline Phosphatase: 64 U/L (ref 25–125)
BILIRUBIN, TOTAL: 0.6 mg/dL

## 2015-09-12 LAB — CBC AND DIFFERENTIAL
HCT: 40 % (ref 36–46)
Hemoglobin: 13.9 g/dL (ref 12.0–16.0)
Platelets: 216 10*3/uL (ref 150–399)
WBC: 3.8 10^3/mL

## 2015-09-12 LAB — BASIC METABOLIC PANEL
BUN: 16 mg/dL (ref 4–21)
Creatinine: 0.9 mg/dL (ref <=1.1)
GLUCOSE: 88 mg/dL
POTASSIUM: 4.3 mmol/L (ref 3.4–5.3)
Sodium: 143 mmol/L (ref 137–147)

## 2015-09-12 LAB — LIPID PANEL
Cholesterol: 198 mg/dL (ref 0–200)
HDL: 78 mg/dL — AB (ref 35–70)
LDL Cholesterol: 96 mg/dL
Triglycerides: 121 mg/dL (ref 40–160)

## 2015-09-13 ENCOUNTER — Ambulatory Visit (INDEPENDENT_AMBULATORY_CARE_PROVIDER_SITE_OTHER): Payer: Medicare Other | Admitting: Obstetrics and Gynecology

## 2015-09-13 ENCOUNTER — Telehealth: Payer: Self-pay

## 2015-09-13 ENCOUNTER — Encounter: Payer: Self-pay | Admitting: Obstetrics and Gynecology

## 2015-09-13 ENCOUNTER — Ambulatory Visit: Payer: Medicare Other | Admitting: Obstetrics and Gynecology

## 2015-09-13 VITALS — BP 134/82 | HR 62 | Resp 20 | Ht 63.75 in | Wt 149.0 lb

## 2015-09-13 DIAGNOSIS — Z01419 Encounter for gynecological examination (general) (routine) without abnormal findings: Secondary | ICD-10-CM | POA: Diagnosis not present

## 2015-09-13 DIAGNOSIS — L900 Lichen sclerosus et atrophicus: Secondary | ICD-10-CM

## 2015-09-13 DIAGNOSIS — N952 Postmenopausal atrophic vaginitis: Secondary | ICD-10-CM

## 2015-09-13 MED ORDER — CLOBETASOL PROPIONATE 0.05 % EX OINT: 1.0000 "application " | TOPICAL_OINTMENT | CUTANEOUS | Status: DC

## 2015-09-13 MED ORDER — ESTROGENS, CONJUGATED 0.625 MG/GM VA CREA: TOPICAL_CREAM | VAGINAL | Status: DC

## 2015-09-13 NOTE — Patient Instructions (Signed)

## 2015-09-13 NOTE — Progress Notes (Signed)
Patient ID: Sarah [name], female   DOB: January 28, 1946, 69 y.o.   MRN: [id number] 69 y.o. [id number] Married Caucasian female here for annual exam.    Has herniated disc of cervical spine.  Has a hx of prior fusion of cervical spine.  Feeling better using a neck brace.   Occasional flare of lichen sclerosus.  Using Clobetasol ointment.   Using Premarin cream once every 7 - 10 days.  Declining tx for osteoporosis.   No bowel function problems.  Some urgency with urination, which is often.   PCP: Bing Matter, PA-C    No LMP recorded. Patient has had a hysterectomy.          Sexually active: Yes.  female partner  The current method of family planning is status post hysterectomy.    Exercising: Yes.    walking and exercise bike. Smoker:  no  Health Maintenance: Pap:  06-03-11 Negative History of abnormal Pap:  no MMG:  07-26-15 Density Cat.C/Neg/BiRads 1:[facility]. Colonoscopy:  02-13-06 diverticulosis/Dr.S.[place]. BMD:   09-10-15  Result:Pending; Hx of Osteoporosis.  Had declined treatment.  Had consultation with Dr. Corena Pilgrim last year.   Results are back to Dr. Corena Pilgrim and patient is declining treatment.  TDaP:  PCP Screening Labs:  Hb today: PCP, Urine today: unable to void   reports that she has never smoked. She has never used smokeless tobacco. She reports that she drinks alcohol. She reports that she does not use illicit drugs.  Past Medical History  Diagnosis Date  . Hypertension   . Hyperlipidemia   . Osteopenia   . Osteoporosis   . Arthritis   . Asthma   . Diverticulosis   . Diverticulitis   . Status post dilation of esophageal narrowing   . Cervical disc disease     bulging    Past Surgical History  Procedure Laterality Date  . Cervical fusion      C-4, C-5 fusion  . Arthroscopic shoulder Left     Dr. Durward Fortes, bone spur  . Knee arthroscopy Bilateral     Dr. Durward Fortes  . Cholecystectomy      Dr. Georgina Quint  . Appendectomy      Dr. Hassell Done  .  Abdominal hysterectomy  1995    TVH--still has ovaries  . Bladder suspension    . Abdominal sacrocolpopexy  2007    Halban culdoplasty & Burch procedure--Dr. Quincy Simmonds  . Other surgical history  2011    posterior Colporrhaphy with Xenform biological graft--Dr. Quincy Simmonds  . Rectocele repair  2011    posterior colporrhaphy with Xenform biological graft--Dr. Quincy Simmonds  . Esophageal dilation    . Hip arthroplasty Left 11/07/2013    Procedure: ARTHROPLASTY BIPOLAR HIP;  Surgeon: Garald Balding, MD;  Location: [place];  Service: Orthopedics;  Laterality: Left;    Current Outpatient Prescriptions  Medication Sig Dispense Refill  . acetaminophen (TYLENOL) 325 MG tablet Take 2 tablets (650 mg total) by mouth every 6 (six) hours as needed for mild pain (or Fever >/= 101).    Marland Kitchen atorvastatin (LIPITOR) 10 MG tablet Take 1 tablet by mouth daily.  3  . beta carotene w/minerals (OCUVITE) tablet Take 1 tablet by mouth daily.    . Calcium Citrate (CITRACAL PO) Take 2 tablets by mouth daily.    . cholecalciferol (D-VI-SOL) 400 UNIT/ML LIQD Take 400 Units by mouth daily.    . clobetasol ointment (TEMOVATE) 0.73 % Apply 1 application topically 2 (two) times a week. 30 g 1  .  Coenzyme Q10 (COQ10) 200 MG CAPS Take 1 tablet by mouth daily.    Marland Kitchen DIOVAN 80 MG tablet Take 80 mg by mouth daily.     Marland Kitchen docusate sodium (COLACE) 100 MG capsule Take 100 mg by mouth as needed for constipation.    Marland Kitchen EPIPEN 2-PAK 0.3 MG/0.3ML SOAJ injection Inject 1 mg into the muscle as needed.    . fish oil-omega-3 fatty acids 1000 MG capsule Take 2 g by mouth daily.    . fluticasone (FLONASE) 50 MCG/ACT nasal spray Place 50 sprays into the nose daily.    Marland Kitchen lactobacillus acidophilus (BACID) TABS tablet Take 2 tablets by mouth 2 (two) times a week.    . Multiple Vitamin (MULTIVITAMIN) capsule Take 1 capsule by mouth daily.    Marland Kitchen NEXIUM 40 MG capsule Take 1 capsule by mouth daily.    . Olopatadine HCl (PATANASE) 0.6 % SOLN Place 1 puff into the nose  as needed.    Marland Kitchen PREMARIN vaginal cream APPLY 1/2 GM VAGINALLY 2 TIMES A WEEK AS NEEDED TO MAINTAIN SYMPTOM RELIEF 30 g 0  . vitamin B-12 (CYANOCOBALAMIN) 1000 MCG tablet Take 1,000 mcg by mouth daily.    . Vitamin Mixture (ESTER-C) 500-60 MG TABS Take 1 capsule by mouth daily.     No current facility-administered medications for this visit.    Family History  Problem Relation Age of Onset  . Hypertension Mother   . Diabetes Father   . Hypertension Father   . Diabetes Sister   . Hypertension Sister   . Thyroid disease Sister   . Heart disease Father   . Heart disease Sister   . Throat cancer Paternal Uncle     smoker  . Lung cancer Cousin     smoker    ROS:  Pertinent items are noted in HPI.  Otherwise, a comprehensive ROS was negative.  Exam:   BP 134/82 mmHg  Pulse 62  Resp 20  Ht 5' 3.75" (1.619 m)  Wt 149 lb (67.586 kg)  BMI 25.78 kg/m2    General appearance: alert, cooperative and appears stated age Head: Normocephalic, without obvious abnormality, atraumatic Neck:  Wearing brace.  Lungs: clear to auscultation bilaterally Breasts: normal appearance, no masses or tenderness, Inspection negative, No nipple retraction or dimpling, No nipple discharge or bleeding, No axillary or supraclavicular adenopathy Heart: regular rate and rhythm Abdomen: soft, non-tender; bowel sounds normal; no masses,  no organomegaly Extremities: extremities normal, atraumatic, no cyanosis or edema Skin: Skin color, texture, turgor normal. No rashes or lesions Lymph nodes: Cervical, supraclavicular, and axillary nodes normal. No abnormal inguinal nodes palpated Neurologic: Grossly normal  Pelvic: External genitalia:  no lesions              Urethra:  normal appearing urethra with no masses, tenderness or lesions              Bartholins and Skenes: normal                 Vagina: normal appearing vagina with normal color and discharge, no lesions              Cervix: absent              Pap  taken: No. Bimanual Exam:  Uterus:  uterus absent              Adnexa: no mass, fullness, tenderness              Rectovaginal: No..  Confirms.              Anus:  normal sphincter tone, no lesions  Chaperone was present for exam.  Assessment:   Well woman visit with normal exam. Status post hysterectomy.  Status post sacrocolpopexy.  Status post rectocele repair with biologic graft.  Atrophy controlled with vaginal Premarin.  Osteoporosis.  Declines tx. Lichen sclerosus.  Controlled with Clobetasol.  Plan: Yearly mammogram recommended after age 16.  Recommended self breast exam.  Pap and HR HPV as above. Discussed Calcium, Vitamin D, regular exercise program including cardiovascular and weight bearing exercise. Labs performed.  No..     Refills given on medications.  Yes.  .  See orders for clobetasol and Premarin cream.  Discussed risks of DVT, PE, MI, stroke, and breast cancer.  Declines tx for osteoporosis. Follow up annually and prn.      After visit summary provided.

## 2015-09-13 NOTE — Telephone Encounter (Signed)
Spoke with [employer]. Verified directions for use of Clobetasol ointment. Pharmacy is agreeable.  Routing to Romaine Neville for final review. Patient agreeable to disposition. Will close encounter.

## 2015-09-13 NOTE — Telephone Encounter (Signed)
Spoke with pharmacist from [facility] at time of incoming call. Asking for clarification for patient's Clobetasol ointment. Rx is written as Clobetasol 5.63% apply 1 application 2 times a week. Pharmacist asking if this needs to be apply 2 applications two times per week. Advised will clarify with Dr.Silva and return call.

## 2015-09-13 NOTE — Telephone Encounter (Signed)
Patient is using this very sparingly and actually not even this often.  She may use it once or twice a day when she is using it.

## 2015-09-19 LAB — FECAL OCCULT BLOOD, GUAIAC: Fecal Occult Blood: NEGATIVE

## 2015-09-20 LAB — IFOBT (OCCULT BLOOD): IFOBT: NEGATIVE

## 2016-02-13 ENCOUNTER — Ambulatory Visit: Payer: Medicare Other | Admitting: Family Medicine

## 2016-04-12 ENCOUNTER — Encounter: Payer: Self-pay | Admitting: General Practice

## 2016-05-21 ENCOUNTER — Ambulatory Visit: Payer: Medicare Other | Admitting: Family Medicine

## 2016-06-03 ENCOUNTER — Ambulatory Visit (INDEPENDENT_AMBULATORY_CARE_PROVIDER_SITE_OTHER): Payer: Medicare Other | Admitting: Family Medicine

## 2016-06-03 ENCOUNTER — Encounter: Payer: Self-pay | Admitting: Family Medicine

## 2016-06-03 VITALS — BP 122/80 | HR 81 | Temp 97.9°F | Resp 16 | Wt 149.0 lb

## 2016-06-03 DIAGNOSIS — R1314 Dysphagia, pharyngoesophageal phase: Secondary | ICD-10-CM | POA: Diagnosis not present

## 2016-06-03 DIAGNOSIS — R1319 Other dysphagia: Secondary | ICD-10-CM

## 2016-06-03 DIAGNOSIS — R1310 Dysphagia, unspecified: Secondary | ICD-10-CM

## 2016-06-03 MED ORDER — RANITIDINE HCL 300 MG PO TABS: 300.0000 mg | ORAL_TABLET | Freq: Every day | ORAL | Status: DC

## 2016-06-03 NOTE — Progress Notes (Signed)
Pre visit review using our clinic review tool, if applicable. No additional management support is needed unless otherwise documented below in the visit note. 

## 2016-06-03 NOTE — Patient Instructions (Signed)
Follow up as scheduled We'll call you with your GI appt Increase the Ranitidine to 300mg  nightly- new prescription sent Drink plenty of fluids Call with any questions or concerns Welcome!  We're glad to have you!!! Hang in there!!!

## 2016-06-03 NOTE — Progress Notes (Signed)
   Subjective:    Patient ID: Sarah [name], female    DOB: 02/11/1946, 70 y.o.   MRN: [id number]  HPI New to establish.  Previous MD-   Dysphagia- pt had Endoscopy (Dr Ardis Hughs) 15-16 yrs ago and had to have esophagus stretched due to reflux.  For the last year, she has had similar sxs of food getting stuck.  This afternoon while eating lunch, she had food stuck in her chest and developed an 'extremely painful' esophageal spasm.  She ended up vomiting her lunch.  Currently taking Zantac 150mg  daily.  Was previously on Nexium but 'it took me almost 3 yrs to wean off'.     Review of Systems For ROS see HPI      Objective:   Physical Exam  Constitutional: She is oriented to person, place, and time. She appears well-developed and well-nourished. No distress.  HENT:  Head: Normocephalic and atraumatic.  Eyes: Conjunctivae and EOM are normal. Pupils are equal, round, and reactive to light.  Neck: Normal range of motion. Neck supple. No thyromegaly present.  Cardiovascular: Normal rate, regular rhythm, normal heart sounds and intact distal pulses.   No murmur heard. Pulmonary/Chest: Effort normal and breath sounds normal. No respiratory distress.  Abdominal: Soft. She exhibits no distension. There is no tenderness.  Musculoskeletal: She exhibits no edema.  Lymphadenopathy:    She has no cervical adenopathy.  Neurological: She is alert and oriented to person, place, and time.  Skin: Skin is warm and dry.  Psychiatric: She has a normal mood and affect. Her behavior is normal.  Vitals reviewed.         Assessment & Plan:

## 2016-06-04 NOTE — Assessment & Plan Note (Signed)
New to provider, recurrent problem for pt.  Increase Ranitidine to 300mg  nightly.  Refer to GI as pt has hx of esophageal dilation.  Reviewed supportive care and red flags that should prompt return.  Pt expressed understanding and is in agreement w/ plan.

## 2016-06-17 ENCOUNTER — Ambulatory Visit (INDEPENDENT_AMBULATORY_CARE_PROVIDER_SITE_OTHER): Payer: Medicare Other | Admitting: Gastroenterology

## 2016-06-17 ENCOUNTER — Encounter: Payer: Self-pay | Admitting: Gastroenterology

## 2016-06-17 VITALS — BP 110/60 | HR 72 | Ht 64.0 in | Wt 148.8 lb

## 2016-06-17 DIAGNOSIS — R1310 Dysphagia, unspecified: Secondary | ICD-10-CM

## 2016-06-17 NOTE — Progress Notes (Signed)
Review of pertinent gastrointestinal problems: 1. Routine risk for colon cancer: Colonoscopy Dr. Velora Heckler for "routine screening" done 02/2006 found left sided diverticulosis, hemorrhoids. No polyps. He recommended she have annual hemocult testing (?) and repeat colonoscopy in 8 years (?). Colonoscopy Dr. Velora Heckler 2001 for screening; no polyps were found, recommended repeat colonoscopy in 5-10 years. 2. "esopahgeal spasm" led to EGD Dr. Velora Heckler 07/2000: found "tight crico" and early stricture in esophagus, dilated with 46 maloney  HPI: This is a    very pleasant 70 year old woman whom I last saw about a year ago  Chief complaint is dysphagia, GERD   I saw her one year ago for essentially the same complaints and recommended barium esophagram. She has never had that test done.  She felt that the test was the correct one for her.    She has been weaning off of nexium due to concern for side effects. She has noticed her dysphagia has gotten worse as she's changed from PPI to H2 blocker.  Had significant dysphagia event 2 weeks ago, chick filet.  Took several minutes for food to dislodge.  She's really modified the way she eats, chewing more.  Meats and breads seem to offend.  She had FOBT testing a year ago and it was negative.   Lost 3 pounds in past month, very active.  She was due for routine risk colon cancer screening with colonoscopy earlier this year but has not had that done.    Past Medical History  Diagnosis Date  . Hypertension   . Hyperlipidemia   . Osteopenia   . Osteoporosis   . Arthritis   . Asthma   . Diverticulosis   . Diverticulitis   . Status post dilation of esophageal narrowing   . Cervical disc disease     bulging    Past Surgical History  Procedure Laterality Date  . Cervical fusion      C-4, C-5 fusion  . Arthroscopic shoulder Left     Dr. Durward Fortes, bone spur  . Knee arthroscopy Bilateral     Dr. Durward Fortes  . Cholecystectomy      Dr. Georgina Quint   . Appendectomy      Dr. Hassell Done  . Abdominal hysterectomy  1995    TVH--still has ovaries  . Bladder suspension    . Abdominal sacrocolpopexy  2007    Halban culdoplasty & Burch procedure--Dr. Quincy Simmonds  . Other surgical history  2011    posterior Colporrhaphy with Xenform biological graft--Dr. Quincy Simmonds  . Rectocele repair  2011    posterior colporrhaphy with Xenform biological graft--Dr. Quincy Simmonds  . Esophageal dilation    . Hip arthroplasty Left 11/07/2013    Procedure: ARTHROPLASTY BIPOLAR HIP;  Surgeon: Garald Balding, MD;  Location: [place];  Service: Orthopedics;  Laterality: Left;    Current Outpatient Prescriptions  Medication Sig Dispense Refill  . atorvastatin (LIPITOR) 10 MG tablet Take 1 tablet by mouth daily.  3  . beta carotene w/minerals (OCUVITE) tablet Take 1 tablet by mouth daily.    . Calcium Citrate (CITRACAL PO) Take 2 tablets by mouth daily.    . clobetasol ointment (TEMOVATE) [id number] % Apply 1 application topically 2 (two) times a week. 30 g 1  . Coenzyme Q10 (COQ10) 200 MG CAPS Take 1 tablet by mouth daily.    Marland Kitchen DIOVAN 80 MG tablet Take 80 mg by mouth daily.     Marland Kitchen docusate sodium (COLACE) 100 MG capsule Take 100 mg by mouth as needed for  constipation.    Marland Kitchen EPIPEN 2-PAK 0.3 MG/0.3ML SOAJ injection Inject 1 mg into the muscle as needed.    . Ergocalciferol (VITAMIN D2) 400 units TABS Take 1 capsule by mouth daily.    . fish oil-omega-3 fatty acids 1000 MG capsule Take 2 g by mouth daily.    . fluticasone (FLONASE) 50 MCG/ACT nasal spray Place 50 sprays into the nose daily.    Marland Kitchen lactobacillus acidophilus (BACID) TABS tablet Take 2 tablets by mouth 2 (two) times a week.    . Multiple Vitamin (MULTIVITAMIN) capsule Take 1 capsule by mouth daily.    . Olopatadine HCl (PATANASE) 0.6 % SOLN Place 1 puff into the nose as needed.    . ranitidine (ZANTAC) 300 MG tablet Take 1 tablet (300 mg total) by mouth at bedtime. 30 tablet 3  . vitamin B-12 (CYANOCOBALAMIN) 1000 MCG tablet Take  1,000 mcg by mouth daily.    . Vitamin Mixture (ESTER-C) 500-60 MG TABS Take 1 capsule by mouth daily.     No current facility-administered medications for this visit.    Allergies as of 06/17/2016 - Review Complete 06/17/2016  Allergen Reaction Noted  . Evista [raloxifene]  10/21/2013  . Fosamax [alendronate] Other (See Comments) 09/13/2015    Family History  Problem Relation Age of Onset  . Hypertension Mother   . Diabetes Father   . Hypertension Father   . Diabetes Sister   . Hypertension Sister   . Thyroid disease Sister   . Heart disease Father   . Heart disease Sister   . Throat cancer Paternal Uncle     smoker  . Lung cancer Cousin     smoker  . Colon cancer Neg Hx   . Pancreatic cancer Neg Hx   . Stomach cancer Neg Hx   . Liver disease Neg Hx     Social History   Social History  . Marital Status: Married    Spouse Name: N/A  . Number of Children: 1  . Years of Education: N/A   Occupational History  . retired    Social History Main Topics  . Smoking status: Never Smoker   . Smokeless tobacco: Never Used  . Alcohol Use: 0.0 oz/week    0 Standard drinks or equivalent per week     Comment: 7 glasses of wine per week  . Drug Use: No  . Sexual Activity:    Partners: Male    Birth Control/ Protection: Other-see comments     Comment: TVH--Still has ovaries   Other Topics Concern  . Not on file   Social History Narrative     Physical Exam: BP 110/60 mmHg  Pulse 72  Ht 5\' 4"  (1.626 m)  Wt 148 lb 12.8 oz (67.495 kg)  BMI 25.53 kg/m2 Constitutional: generally well-appearing Psychiatric: alert and oriented x3 Abdomen: soft, nontender, nondistended, no obvious ascites, no peritoneal signs, normal bowel sounds   Assessment and plan: 70 y.o. female with GERD, intermittent dysphasia, routine risk for colon cancer  It does not seem that H2 blocker is controlling her GERD symptoms and intermittent dysphasia well enough. She is going to change to OTC  strength proton pump inhibitor and start taking that pill once daily shortly before breakfast. We are going to proceed with EGD at her soonest convenience for her intermittent dysphagia to exclude significant pathology which I think is unlikely. We discussed colon cancer screening options and she is not interested in repeat colonoscopy but would rather have Colo guard stool  testing. She understands if it is positive that she will likely need repeat colonoscopy however if it is negative then she will probably not need colon cancer screening again for about 3 years.   Owens Loffler, MD [facility] 06/17/2016, 3:37 PM

## 2016-06-17 NOTE — Patient Instructions (Signed)
cologuard testing for colon cancer screening. Start nexium OTC (20mg  pill) best 20-30 min before breakfast meal daily. You will be set up for an upper endoscopy for intermittent dysphagia.

## 2016-06-27 ENCOUNTER — Encounter: Payer: Self-pay | Admitting: Family Medicine

## 2016-06-27 ENCOUNTER — Ambulatory Visit (INDEPENDENT_AMBULATORY_CARE_PROVIDER_SITE_OTHER): Payer: Medicare Other | Admitting: Family Medicine

## 2016-06-27 VITALS — BP 128/80 | HR 58 | Temp 97.9°F | Resp 16 | Ht 64.0 in | Wt 149.0 lb

## 2016-06-27 DIAGNOSIS — J3089 Other allergic rhinitis: Secondary | ICD-10-CM | POA: Insufficient documentation

## 2016-06-27 DIAGNOSIS — I10 Essential (primary) hypertension: Secondary | ICD-10-CM

## 2016-06-27 DIAGNOSIS — E785 Hyperlipidemia, unspecified: Secondary | ICD-10-CM

## 2016-06-27 LAB — CBC WITH DIFFERENTIAL/PLATELET
BASOS ABS: 38 {cells}/uL (ref 0–200)
Basophils Relative: 1 %
Eosinophils Absolute: 152 cells/uL (ref 15–500)
Eosinophils Relative: 4 %
HEMATOCRIT: 38 % (ref 35.0–45.0)
HEMOGLOBIN: 13.1 g/dL (ref 11.7–15.5)
Lymphocytes Relative: 39 %
Lymphs Abs: 1482 cells/uL (ref 850–3900)
MCH: 30.4 pg (ref 27.0–33.0)
MCHC: 34.5 g/dL (ref 32.0–36.0)
MCV: 88.2 fL (ref 80.0–100.0)
MONO ABS: 266 {cells}/uL (ref 200–950)
MONOS PCT: 7 %
MPV: 10 fL (ref 7.5–12.5)
Neutro Abs: 1862 cells/uL (ref 1500–7800)
Neutrophils Relative %: 49 %
PLATELETS: 205 10*3/uL (ref 140–400)
RBC: 4.31 MIL/uL (ref 3.80–5.10)
RDW: 13.9 % (ref 11.0–15.0)
WBC: 3.8 10*3/uL (ref 3.8–10.8)

## 2016-06-27 LAB — HEPATIC FUNCTION PANEL
ALBUMIN: 4.2 g/dL (ref 3.6–5.1)
ALT: 21 U/L (ref 6–29)
AST: 20 U/L (ref 10–35)
Alkaline Phosphatase: 57 U/L (ref 33–130)
BILIRUBIN INDIRECT: 0.3 mg/dL (ref 0.2–1.2)
Bilirubin, Direct: 0.1 mg/dL (ref <=0.2)
TOTAL PROTEIN: 6.5 g/dL (ref 6.1–8.1)
Total Bilirubin: 0.4 mg/dL (ref 0.2–1.2)

## 2016-06-27 LAB — LIPID PANEL
Cholesterol: 186 mg/dL (ref 125–200)
HDL: 69 mg/dL (ref >=46)
LDL CALC: 78 mg/dL (ref <130)
Total CHOL/HDL Ratio: 2.7 Ratio (ref <=5.0)
Triglycerides: 194 mg/dL — ABNORMAL HIGH (ref <150)
VLDL: 39 mg/dL — ABNORMAL HIGH (ref <30)

## 2016-06-27 LAB — BASIC METABOLIC PANEL
BUN: 14 mg/dL (ref 7–25)
CO2: 26 mmol/L (ref 20–31)
Calcium: 9 mg/dL (ref 8.6–10.4)
Chloride: 106 mmol/L (ref 98–110)
Creat: 0.98 mg/dL — ABNORMAL HIGH (ref 0.60–0.93)
GLUCOSE: 88 mg/dL (ref 65–99)
POTASSIUM: 4.3 mmol/L (ref 3.5–5.3)
SODIUM: 141 mmol/L (ref 135–146)

## 2016-06-27 LAB — TSH: TSH: 1.24 mIU/L

## 2016-06-27 NOTE — Progress Notes (Signed)
   Subjective:    Patient ID: Sarah [name], female    DOB: 1946/06/09, 70 y.o.   MRN: [id number]  HPI HTN- chronic problem, on Diovan daily w/ good control.  Pt reports BP has never stayed elevated but would periodically spike.  No CP.  + SOB- pt feels this is allergy related.  Denies HAs, visual changes, edema.  Hyperlipidemia- chronic problem, on Lipitor every 3 days and Fish Oil.  No abd pain, N/V, myalgias.  Some limited exercise.  Seasonal allergies- chronic problem, following w/ Dr Harold Hedge.  Relying on Flonase b/c she is no longer able to take Zyrtec daily.  Getting allergy shots regularly for over 20 yrs- pt has been able to space these out to once monthly.  Recent SOB.  Health Maintenance- pt is doing Cologuard w/ Dr Ardis Hughs, UTD on mammo, DEXA.  UTD on Prevnar.  Had Pneumovax and Shingles at [facility].   Review of Systems For ROS see HPI     Objective:   Physical Exam  Constitutional: She is oriented to person, place, and time. She appears well-developed and well-nourished. No distress.  HENT:  Head: Normocephalic and atraumatic.  Eyes: Conjunctivae and EOM are normal. Pupils are equal, round, and reactive to light.  Neck: Normal range of motion. Neck supple. No thyromegaly present.  Cardiovascular: Normal rate, regular rhythm, normal heart sounds and intact distal pulses.   No murmur heard. Pulmonary/Chest: Effort normal and breath sounds normal. No respiratory distress.  Abdominal: Soft. She exhibits no distension. There is no tenderness.  Musculoskeletal: She exhibits no edema.  Lymphadenopathy:    She has no cervical adenopathy.  Neurological: She is alert and oriented to person, place, and time.  Skin: Skin is warm and dry.  Psychiatric: She has a normal mood and affect. Her behavior is normal.  Vitals reviewed.         Assessment & Plan:

## 2016-06-27 NOTE — Assessment & Plan Note (Signed)
New to provider, ongoing for pt.  Following w/ Dr Fredderick Phenix and getting monthly allergy shots.  Pt is not able to tolerate daily antihistamine and is using Flonase PRN.  Will continue to follow along.

## 2016-06-27 NOTE — Progress Notes (Signed)
.  lbpc

## 2016-06-27 NOTE — Patient Instructions (Signed)
Schedule your complete physical in 6 months We'll notify you of your lab results and make any changes if needed Continue to work on healthy diet and regular exercise- you look great! Call with any questions or concerns Have a great summer!!  

## 2016-06-27 NOTE — Assessment & Plan Note (Signed)
New to provider, ongoing for pt.  Taking Lipitor every 3 days to keep LDL from climbing.  Check labs.  Adjust meds prn

## 2016-06-27 NOTE — Assessment & Plan Note (Signed)
New to provider, ongoing for pt.  Well controlled on Diovan.  Asymptomatic.  Check lab.  No anticipated med changes.

## 2016-06-28 ENCOUNTER — Encounter: Payer: Self-pay | Admitting: Gastroenterology

## 2016-06-28 ENCOUNTER — Ambulatory Visit (AMBULATORY_SURGERY_CENTER): Payer: Medicare Other | Admitting: Gastroenterology

## 2016-06-28 VITALS — BP 129/67 | HR 74 | Temp 98.9°F | Resp 16 | Ht 64.0 in | Wt 148.0 lb

## 2016-06-28 DIAGNOSIS — R1310 Dysphagia, unspecified: Secondary | ICD-10-CM | POA: Diagnosis present

## 2016-06-28 DIAGNOSIS — K222 Esophageal obstruction: Secondary | ICD-10-CM | POA: Diagnosis not present

## 2016-06-28 MED ORDER — SODIUM CHLORIDE 0.9 % IV SOLN: 500.0000 mL | INTRAVENOUS | Status: DC

## 2016-06-28 NOTE — Op Note (Signed)
[facility] Patient Name: Sarah [name] Procedure Date: 06/28/2016 1:16 PM MRN: [id number] Endoscopist: Milus Banister , MD Age: 70 Referring MD:  Date of Birth: 1946-03-13 Gender: Female Account #: [id number] Procedure:                Upper GI endoscopy Indications:              Dysphagia Medicines:                Monitored Anesthesia Care Procedure:                Pre-Anesthesia Assessment:                           - Prior to the procedure, a History and Physical                            was performed, and patient medications and                            allergies were reviewed. The patient's tolerance of                            previous anesthesia was also reviewed. The risks                            and benefits of the procedure and the sedation                            options and risks were discussed with the patient.                            All questions were answered, and informed consent                            was obtained. Prior Anticoagulants: The patient has                            taken no previous anticoagulant or antiplatelet                            agents. ASA Grade Assessment: II - A patient with                            mild systemic disease. After reviewing the risks                            and benefits, the patient was deemed in                            satisfactory condition to undergo the procedure.                           After obtaining informed consent, the endoscope was  passed under direct vision. Throughout the                            procedure, the patient's blood pressure, pulse, and                            oxygen saturations were monitored continuously. The                            Model GIF-HQ190 [id number]) scope was introduced                            through the mouth, and advanced to the third part                            of duodenum. The upper GI endoscopy was                             accomplished without difficulty. The patient                            tolerated the procedure well. Scope In: Scope Out: Findings:                 One mild benign-appearing, intrinsic stenosis was                            found at the esophageal anastomosis. Schatzki's                            ring vs. focal peptic stricture. This measured 1.5                            cm (inner diameter) and was traversed. A TTS                            dilator was passed through the scope. Dilation with                            an 18-19-20 mm balloon dilator was performed to 20                            mm. The dilation site was examined and showed minor                            selft limited oozing of blood.                           The exam was otherwise without abnormality. Complications:            No immediate complications. Estimated blood loss:                            None. Estimated Blood Loss:     Estimated blood loss: none. Impression:               -  Benign-appearing esophageal stenosis. Dilated.                           - The examination was otherwise normal.                           - No specimens collected. Recommendation:           - Patient has a contact number available for                            emergencies. The signs and symptoms of potential                            delayed complications were discussed with the                            patient. Return to normal activities tomorrow.                            Written discharge instructions were provided to the                            patient.                           - Resume previous diet.                           - Continue present medications: nexium 20mg  once                            daily (shortly before breakfast meal).                           - Repeat upper endoscopy PRN as needed. Milus Banister, MD 06/28/2016 1:39:42 PM This report has been signed electronically.

## 2016-06-28 NOTE — Progress Notes (Signed)
Teeth unchanged after procedure.A and O x3. Report to RN. Tolerated MAC anesthesia well. 

## 2016-06-28 NOTE — Patient Instructions (Signed)
YOU HAD AN ENDOSCOPIC PROCEDURE TODAY AT [facility]:   Refer to the procedure report that was given to you for any specific questions about what was found during the examination.  If the procedure report does not answer your questions, please call your gastroenterologist to clarify.  If you requested that your care partner not be given the details of your procedure findings, then the procedure report has been included in a sealed envelope for you to review at your convenience later.  YOU SHOULD EXPECT: Some feelings of bloating in the abdomen. Passage of more gas than usual.  Walking can help get rid of the air that was put into your GI tract during the procedure and reduce the bloating. If you had a lower endoscopy (such as a colonoscopy or flexible sigmoidoscopy) you may notice spotting of blood in your stool or on the toilet paper. If you underwent a bowel prep for your procedure, you may not have a normal bowel movement for a few days.  Please Note:  You might notice some irritation and congestion in your nose or some drainage.  This is from the oxygen used during your procedure.  There is no need for concern and it should clear up in a day or so.  SYMPTOMS TO REPORT IMMEDIATELY:   Following upper endoscopy (EGD)  Vomiting of blood or coffee ground material  New chest pain or pain under the shoulder blades  Painful or persistently difficult swallowing  New shortness of breath  Fever of 100F or higher  Black, tarry-looking stools  For urgent or emergent issues, a gastroenterologist can be reached at any hour by calling [phone].   DIET: Your first meal following the procedure should be a small meal and then it is ok to progress to your normal diet. Heavy or fried foods are harder to digest and may make you feel nauseous or bloated.  Likewise, meals heavy in dairy and vegetables can increase bloating.  Drink plenty of fluids but you should avoid alcoholic beverages for  24 hours.  ACTIVITY:  You should plan to take it easy for the rest of today and you should NOT DRIVE or use heavy machinery until tomorrow (because of the sedation medicines used during the test).    FOLLOW UP: Our staff will call the number listed on your records the next business day following your procedure to check on you and address any questions or concerns that you may have regarding the information given to you following your procedure. If we do not reach you, we will leave a message.  However, if you are feeling well and you are not experiencing any problems, there is no need to return our call.  We will assume that you have returned to your regular daily activities without incident.  If any biopsies were taken you will be contacted by phone or by letter within the next 1-3 weeks.  Please call us at [phone] if you have not heard about the biopsies in 3 weeks.    SIGNATURES/CONFIDENTIALITY: You and/or your care partner have signed paperwork which will be entered into your electronic medical record.  These signatures attest to the fact that that the information above on your After Visit Summary has been reviewed and is understood.  Full responsibility of the confidentiality of this discharge information lies with you and/or your care-partner.  Nexium 20 mg once daily  Dilation diet instructions given, please review handout provided.

## 2016-07-01 ENCOUNTER — Telehealth: Payer: Self-pay

## 2016-07-01 NOTE — Telephone Encounter (Signed)
  Follow up Call-  Call back number 06/28/2016  Post procedure Call Back phone  # [phone]  Permission to leave phone message Yes     Patient questions:  Do you have a fever, pain , or abdominal swelling? No. Pain Score  0 *  Have you tolerated food without any problems? Yes.    Have you been able to return to your normal activities? Yes.    Do you have any questions about your discharge instructions: Diet   No. Medications  No. Follow up visit  No.  Do you have questions or concerns about your Care? No.  Actions: * If pain score is 4 or above: No action needed, pain <4.

## 2016-07-16 ENCOUNTER — Telehealth: Payer: Self-pay | Admitting: Gastroenterology

## 2016-07-16 NOTE — Telephone Encounter (Signed)
cologuard was negative (dated 07/01/16)  Please call her.  She does not need colon cancer screening by any method for 3 years.  Put in for recall cologuard in 3 years.  Thanks

## 2016-07-16 NOTE — Telephone Encounter (Signed)
Left message for pt to call back  °

## 2016-07-19 NOTE — Telephone Encounter (Signed)
The pt has been notified of the recall cologuard in 3 years and to call with any complaints prior to that recall.

## 2016-08-04 LAB — COLOGUARD: COLOGUARD: NEGATIVE

## 2016-09-16 ENCOUNTER — Ambulatory Visit (INDEPENDENT_AMBULATORY_CARE_PROVIDER_SITE_OTHER): Payer: Medicare Other | Admitting: Physical Medicine and Rehabilitation

## 2016-09-16 DIAGNOSIS — M47818 Spondylosis without myelopathy or radiculopathy, sacral and sacrococcygeal region: Secondary | ICD-10-CM | POA: Diagnosis not present

## 2016-09-25 ENCOUNTER — Ambulatory Visit (INDEPENDENT_AMBULATORY_CARE_PROVIDER_SITE_OTHER): Payer: Medicare Other | Admitting: Obstetrics and Gynecology

## 2016-09-25 ENCOUNTER — Encounter: Payer: Self-pay | Admitting: Obstetrics and Gynecology

## 2016-09-25 VITALS — BP 136/80 | HR 56 | Resp 16 | Ht 63.75 in | Wt 145.0 lb

## 2016-09-25 DIAGNOSIS — M810 Age-related osteoporosis without current pathological fracture: Secondary | ICD-10-CM

## 2016-09-25 DIAGNOSIS — Z01419 Encounter for gynecological examination (general) (routine) without abnormal findings: Secondary | ICD-10-CM | POA: Diagnosis not present

## 2016-09-25 NOTE — Progress Notes (Signed)
70 y.o. G66P1011 Married Caucasian female here for annual exam.    Has osteoporosis and has chosen not to treat.  States she never really received results from Dr. Rosana Hoes office. Used Evista in past and had flu like symptoms.  Has a narrow esophagus.   Having cervical spine degeneration problems. Uses a C collar.  Has a history of a hip fracture after a fall.   Does occasional steroid injection for bursitis.  Stopped using Premarin vaginal cream.   Using clobetasol occasionally.   Is still sexually active although less often.   Good bladder and bowel control.  Occasional leakage if she sits for too long and does not void often enough.   Lost 5 loved ones in 6 month.s  Both best friends (neighbor couple) passed away.  Niece also died of HPV cervical cancer.   PCP:   Annye Asa   Patient's last menstrual period was 12/16/1993 (approximate).           Sexually active: Yes.    The current method of family planning is status post hysterectomy.    Exercising: Yes.    Walking Smoker:  no  Health Maintenance: Pap:  06/03/11- Negative History of abnormal Pap:  no MMG:  07/26/15 BIRADS1:Negative.   Colonoscopy:  02/13/06 diverticulosis/Dr. Leanora Cover. F/u 8 years  BMD:   09/10/15  Result: Osteoporosis.  Left hip -2.8. TDaP:  PCP Screening Labs: PCP   reports that she has never smoked. She has never used smokeless tobacco. She reports that she drinks alcohol. She reports that she does not use drugs.  Past Medical History:  Diagnosis Date  . Arthritis   . Asthma   . Cervical disc disease    bulging  . Diverticulitis   . Diverticulosis   . Hyperlipidemia   . Hypertension   . Osteopenia   . Osteoporosis   . Status post dilation of esophageal narrowing     Past Surgical History:  Procedure Laterality Date  . ABDOMINAL HYSTERECTOMY  1995   TVH--still has ovaries  . ABDOMINAL SACROCOLPOPEXY  2007   Halban culdoplasty & Burch procedure--Dr. Quincy Simmonds  . APPENDECTOMY      Dr. Hassell Done  . arthroscopic shoulder Left    Dr. Durward Fortes, bone spur  . BLADDER SUSPENSION    . CERVICAL FUSION     C-4, C-5 fusion  . CHOLECYSTECTOMY     Dr. Georgina Quint  . ESOPHAGEAL DILATION    . HIP ARTHROPLASTY Left 11/07/2013   Procedure: ARTHROPLASTY BIPOLAR HIP;  Surgeon: Garald Balding, MD;  Location: [facility];  Service: Orthopedics;  Laterality: Left;  . KNEE ARTHROSCOPY Bilateral    Dr. Durward Fortes  . OTHER SURGICAL HISTORY  2011   posterior Colporrhaphy with Xenform biological graft--Dr. Quincy Simmonds  . RECTOCELE REPAIR  2011   posterior colporrhaphy with Xenform biological graft--Dr. Quincy Simmonds    Current Outpatient Prescriptions  Medication Sig Dispense Refill  . atorvastatin (LIPITOR) 10 MG tablet Take 1 tablet by mouth daily.  3  . beta carotene w/minerals (OCUVITE) tablet Take 1 tablet by mouth daily.    . Calcium Citrate (CITRACAL PO) Take 2 tablets by mouth daily.    . clobetasol ointment (TEMOVATE) [id number] % Apply 1 application topically 2 (two) times a week. 30 g 1  . Coenzyme Q10 (COQ10) 200 MG CAPS Take 1 tablet by mouth daily.    Marland Kitchen DIOVAN 80 MG tablet Take 80 mg by mouth daily.     Marland Kitchen docusate sodium (COLACE) 100 MG capsule  Take 100 mg by mouth as needed for constipation.    Marland Kitchen EPIPEN 2-PAK 0.3 MG/0.3ML SOAJ injection Inject 1 mg into the muscle as needed. Reported on 06/28/2016    . Ergocalciferol (VITAMIN D2) 400 units TABS Take 1 capsule by mouth daily.    Marland Kitchen esomeprazole (NEXIUM) 20 MG capsule Take 20 mg by mouth daily at 12 noon.    . fish oil-omega-3 fatty acids 1000 MG capsule Take 2 g by mouth daily.    . fluticasone (FLONASE) 50 MCG/ACT nasal spray Place 1 spray into both nostrils daily as needed.    . lactobacillus acidophilus (BACID) TABS tablet Take 2 tablets by mouth 2 (two) times a week.    . Multiple Vitamin (MULTIVITAMIN) capsule Take 1 capsule by mouth daily.    . Olopatadine HCl (PATANASE) 0.6 % SOLN Place 1 puff into the nose as needed. Reported on  06/27/2016    . vitamin B-12 (CYANOCOBALAMIN) 1000 MCG tablet Take 1,000 mcg by mouth daily.    . Vitamin Mixture (ESTER-C) 500-60 MG TABS Take 1 capsule by mouth daily.     No current facility-administered medications for this visit.     Family History  Problem Relation Age of Onset  . Hypertension Mother   . Diabetes Father   . Hypertension Father   . Heart disease Father   . Diabetes Sister   . Hypertension Sister   . Thyroid disease Sister   . Heart disease Sister   . Throat cancer Paternal Uncle     smoker  . Lung cancer Cousin     smoker  . Colon cancer Neg Hx   . Pancreatic cancer Neg Hx   . Stomach cancer Neg Hx   . Liver disease Neg Hx     ROS:  Pertinent items are noted in HPI.  Otherwise, a comprehensive ROS was negative.  Exam:   BP 136/80 (BP Location: Right Arm, Patient Position: Sitting, Cuff Size: Normal)   Pulse (!) 56   Resp 16   Ht 5' 3.75" (1.619 m)   Wt 145 lb (65.8 kg)   LMP 12/16/1993 (Approximate)   BMI 25.08 kg/m     General appearance: alert, cooperative and appears stated age Head: Normocephalic, without obvious abnormality, atraumatic Neck: no adenopathy, supple, symmetrical, trachea midline and thyroid normal to inspection and palpation Lungs: clear to auscultation bilaterally Breasts: normal appearance, no masses or tenderness, No nipple retraction or dimpling, No nipple discharge or bleeding, No axillary or supraclavicular adenopathy Heart: regular rate and rhythm Abdomen: soft, non-tender; no masses, no organomegaly Extremities: extremities normal, atraumatic, no cyanosis or edema Skin: Skin color, texture, turgor normal. No rashes or lesions Lymph nodes: Cervical, supraclavicular, and axillary nodes normal. No abnormal inguinal nodes palpated Neurologic: Grossly normal  Pelvic: External genitalia:  no lesions              Urethra:  normal appearing urethra with no masses, tenderness or lesions              Bartholins and Skenes:  normal                 Vagina: normal appearing vagina with normal color and discharge, no lesions.  Small cystocele.  Good apical and posterior vaginal support.              Cervix: absent              Pap taken: No. Bimanual Exam:  Uterus:   absent  Adnexa: no mass, fullness, tenderness              Rectal exam: Yes.  .  Confirms.              Anus:  normal sphincter tone, no lesions  Chaperone was present for exam.  Assessment:   Well woman visit with normal exam. Status post hysterectomy.  Status post sacrocolpopexy.  Status post rectocele repair with biologic graft.  Atrophy controlled with vaginal Premarin.  Osteoporosis.  Has declined tx.  Lichen sclerosus.  Controlled with Clobetasol.  Plan: Yearly mammogram recommended after age 70.  Recommended self breast exam.  Pap and HR HPV as above. Discussed Calcium, Vitamin D, regular exercise program including cardiovascular and weight bearing exercise. Patient will follow up with Dr. Corena Pilgrim regarding her osteoporosis.  We did discuss tx with Reclast, Prolia, Fortea, and calcitonin.   I did remind her she is at risk for fracture and recommend avoiding ladders and activities that could cause a fall! No refills needed for vaginal estrogen cream or clobetasol.  Follow up annually and prn.       After visit summary provided.

## 2016-09-25 NOTE — Patient Instructions (Signed)

## 2016-10-14 ENCOUNTER — Other Ambulatory Visit: Payer: Self-pay | Admitting: Obstetrics and Gynecology

## 2016-10-14 DIAGNOSIS — Z1231 Encounter for screening mammogram for malignant neoplasm of breast: Secondary | ICD-10-CM

## 2016-11-01 ENCOUNTER — Ambulatory Visit: Payer: Medicare Other

## 2016-11-04 ENCOUNTER — Ambulatory Visit (INDEPENDENT_AMBULATORY_CARE_PROVIDER_SITE_OTHER): Payer: Medicare Other | Admitting: Family Medicine

## 2016-11-04 ENCOUNTER — Encounter: Payer: Self-pay | Admitting: Family Medicine

## 2016-11-04 VITALS — BP 130/81 | HR 64 | Temp 98.1°F | Resp 17 | Ht 64.0 in | Wt 147.5 lb

## 2016-11-04 DIAGNOSIS — M810 Age-related osteoporosis without current pathological fracture: Secondary | ICD-10-CM

## 2016-11-04 DIAGNOSIS — Z23 Encounter for immunization: Secondary | ICD-10-CM

## 2016-11-04 DIAGNOSIS — I10 Essential (primary) hypertension: Secondary | ICD-10-CM | POA: Diagnosis not present

## 2016-11-04 DIAGNOSIS — E785 Hyperlipidemia, unspecified: Secondary | ICD-10-CM

## 2016-11-04 DIAGNOSIS — Z0000 Encounter for general adult medical examination without abnormal findings: Secondary | ICD-10-CM

## 2016-11-04 LAB — VITAMIN D 25 HYDROXY (VIT D DEFICIENCY, FRACTURES): VITD: 66.34 ng/mL (ref 30.00–100.00)

## 2016-11-04 LAB — CBC WITH DIFFERENTIAL/PLATELET
BASOS ABS: 0 10*3/uL (ref 0.0–0.1)
Basophils Relative: 0.7 % (ref 0.0–3.0)
EOS ABS: 0.1 10*3/uL (ref 0.0–0.7)
Eosinophils Relative: 2.9 % (ref 0.0–5.0)
HCT: 39.4 % (ref 36.0–46.0)
Hemoglobin: 13.4 g/dL (ref 12.0–15.0)
LYMPHS ABS: 1.3 10*3/uL (ref 0.7–4.0)
Lymphocytes Relative: 27.5 % (ref 12.0–46.0)
MCHC: 33.9 g/dL (ref 30.0–36.0)
MCV: 89.2 fl (ref 78.0–100.0)
MONO ABS: 0.3 10*3/uL (ref 0.1–1.0)
MONOS PCT: 6.8 % (ref 3.0–12.0)
NEUTROS ABS: 3 10*3/uL (ref 1.4–7.7)
NEUTROS PCT: 62.1 % (ref 43.0–77.0)
PLATELETS: 196 10*3/uL (ref 150.0–400.0)
RBC: 4.42 Mil/uL (ref 3.87–5.11)
RDW: 14.1 % (ref 11.5–15.5)
WBC: 4.8 10*3/uL (ref 4.0–10.5)

## 2016-11-04 LAB — HEPATIC FUNCTION PANEL
ALBUMIN: 4.5 g/dL (ref 3.5–5.2)
ALK PHOS: 61 U/L (ref 39–117)
ALT: 18 U/L (ref 0–35)
AST: 18 U/L (ref 0–37)
Bilirubin, Direct: 0.1 mg/dL (ref 0.0–0.3)
TOTAL PROTEIN: 7 g/dL (ref 6.0–8.3)
Total Bilirubin: 0.7 mg/dL (ref 0.2–1.2)

## 2016-11-04 LAB — LIPID PANEL
CHOLESTEROL: 184 mg/dL (ref 0–200)
HDL: 75.2 mg/dL (ref >39.00)
LDL CALC: 86 mg/dL (ref 0–99)
NonHDL: 109.22
TRIGLYCERIDES: 115 mg/dL (ref 0.0–149.0)
Total CHOL/HDL Ratio: 2
VLDL: 23 mg/dL (ref 0.0–40.0)

## 2016-11-04 LAB — BASIC METABOLIC PANEL
BUN: 14 mg/dL (ref 6–23)
CALCIUM: 9.6 mg/dL (ref 8.4–10.5)
CO2: 29 meq/L (ref 19–32)
Chloride: 105 mEq/L (ref 96–112)
Creatinine, Ser: 0.83 mg/dL (ref 0.40–1.20)
GFR: 72.09 mL/min (ref >60.00)
GLUCOSE: 90 mg/dL (ref 70–99)
POTASSIUM: 4.1 meq/L (ref 3.5–5.1)
Sodium: 143 mEq/L (ref 135–145)

## 2016-11-04 LAB — TSH: TSH: 0.89 u[IU]/mL (ref 0.35–4.50)

## 2016-11-04 MED ORDER — CETIRIZINE HCL 10 MG PO TABS: 10.0000 mg | ORAL_TABLET | Freq: Every day | ORAL | 11 refills | Status: AC

## 2016-11-04 NOTE — Patient Instructions (Signed)
Follow up in 6 months to recheck BP and cholesterol We'll notify you of your lab results and make any changes if needed Keep up the good work on healthy diet and regular exercise- you look great! You are not due for repeat Cologuard until 2020- yay! With your Pneumovax today, you are up to date on all vaccines- yay! Please send me a copy of your mammo report You are not due for bone density until next year Restart Zyrtec daily for the post-nasal drip and allergy symptoms Call with any questions or concerns Happy Holidays!!!

## 2016-11-04 NOTE — Assessment & Plan Note (Signed)
Chronic problem, well controlled today.  Asymptomatic.  Check labs.  No anticipated med changes.  Will follow. 

## 2016-11-04 NOTE — Progress Notes (Signed)
   Subjective:    Patient ID: Sarah [name], female    DOB: Jan 26, 1946, 70 y.o.   MRN: [id number]  HPI Here today for CPE.  Risk Factors: HTN- chronic problem, on Diovan Hyperlipidemia- chronic problem, on Lipitor daily Osteoporosis- chronic problem, UTD on DEXA (due next year).  On Ca and Vit D daily Physical Activity:  Doing home exercises regularly Fall Risk: low Depression: denies current sxs Hearing: normal to conversational tones, mildly decreased to whispered voice ADL's: independent Cognitive: normal linear thought process, memory and attention intact Home Safety: safe at home Height, Weight, BMI, Visual Acuity: see vitals, vision corrected to 20/20 w/ glasses Counseling: UTD on cologuard w/ GI (due 2020), mammo scheduled, UTD on DEXA.  UTD on flu.  Due for Pneumovax. Care team reviewed and updated Labs Ordered: See A&P Care Plan: See A&P    Review of Systems Patient reports no vision/ hearing changes, adenopathy,fever, weight change,  persistant/recurrent hoarseness , swallowing issues, chest pain, palpitations, edema, hemoptysis, dyspnea (rest/exertional/paroxysmal nocturnal), gastrointestinal bleeding (melena, rectal bleeding), abdominal pain, significant heartburn, bowel changes, GU symptoms (dysuria, hematuria, incontinence), Gyn symptoms (abnormal  bleeding, pain),  syncope, focal weakness, memory loss, numbness & tingling, skin/hair/nail changes, abnormal bruising or bleeding, anxiety, or depression.   + chronic cough- pt has both GERD and under treated allergies w/ ongoing PND.  Pt was told by previous PCP to stop Zyrtec    Objective:   Physical Exam General Appearance:    Alert, cooperative, no distress, appears stated age  Head:    Normocephalic, without obvious abnormality, atraumatic  Eyes:    PERRL, conjunctiva/corneas clear, EOM's intact, fundi    benign, both eyes  Ears:    Normal TM's and external ear canals, both ears  Nose:   Nares normal, septum  midline, mucosa normal, no drainage    or sinus tenderness  Throat:   Lips, mucosa, and tongue normal; teeth and gums normal  Neck:   Supple, symmetrical, trachea midline, no adenopathy;    Thyroid: no enlargement/tenderness/nodules  Back:     Symmetric, no curvature, ROM normal, no CVA tenderness  Lungs:     Clear to auscultation bilaterally, respirations unlabored  Chest Wall:    No tenderness or deformity   Heart:    Regular rate and rhythm, S1 and S2 normal, no murmur, rub   or gallop  Breast Exam:    Deferred to mammo  Abdomen:     Soft, non-tender, bowel sounds active all four quadrants,    no masses, no organomegaly  Genitalia:    Deferred  Rectal:    Extremities:   Extremities normal, atraumatic, no cyanosis or edema  Pulses:   2+ and symmetric all extremities  Skin:   Skin color, texture, turgor normal, no rashes or lesions  Lymph nodes:   Cervical, supraclavicular, and axillary nodes normal  Neurologic:   CNII-XII intact, normal strength, sensation and reflexes    throughout          Assessment & Plan:

## 2016-11-04 NOTE — Assessment & Plan Note (Signed)
Chronic problem.  Tolerating statin w/o difficulty.  Check labs.  Adjust meds prn  

## 2016-11-04 NOTE — Assessment & Plan Note (Signed)
Pt's PE WNL.  UTD on cologuard and DEXA- mammo scheduled.  Pneumovax given today in office.  Written screening schedule updated and given to pt.  Check labs.  Anticipatory guidance provided.

## 2016-11-04 NOTE — Progress Notes (Signed)
Pre visit review using our clinic review tool, if applicable. No additional management support is needed unless otherwise documented below in the visit note. 

## 2016-11-04 NOTE — Assessment & Plan Note (Signed)
Chronic problem, UTD on DEXA.  On Ca and Vit D.  Check Vit D.  Replete prn.  Pt expressed understanding and is in agreement w/ plan.

## 2016-12-02 ENCOUNTER — Ambulatory Visit
Admission: RE | Admit: 2016-12-02 | Discharge: 2016-12-02 | Disposition: A | Discharge status: Home / Self Care | Payer: Medicare Other | Source: Ambulatory Visit | Attending: Obstetrics and Gynecology | Admitting: Obstetrics and Gynecology

## 2016-12-02 DIAGNOSIS — Z1231 Encounter for screening mammogram for malignant neoplasm of breast: Secondary | ICD-10-CM

## 2016-12-23 DIAGNOSIS — M170 Bilateral primary osteoarthritis of knee: Secondary | ICD-10-CM | POA: Insufficient documentation

## 2016-12-23 DIAGNOSIS — Z8679 Personal history of other diseases of the circulatory system: Secondary | ICD-10-CM | POA: Insufficient documentation

## 2016-12-23 DIAGNOSIS — Z8781 Personal history of (healed) traumatic fracture: Secondary | ICD-10-CM | POA: Insufficient documentation

## 2016-12-23 DIAGNOSIS — M19042 Primary osteoarthritis, left hand: Secondary | ICD-10-CM

## 2016-12-23 DIAGNOSIS — M19041 Primary osteoarthritis, right hand: Secondary | ICD-10-CM | POA: Insufficient documentation

## 2016-12-23 DIAGNOSIS — M47812 Spondylosis without myelopathy or radiculopathy, cervical region: Secondary | ICD-10-CM | POA: Insufficient documentation

## 2016-12-23 DIAGNOSIS — M4004 Postural kyphosis, thoracic region: Secondary | ICD-10-CM | POA: Insufficient documentation

## 2016-12-23 DIAGNOSIS — Z8719 Personal history of other diseases of the digestive system: Secondary | ICD-10-CM | POA: Insufficient documentation

## 2016-12-23 NOTE — Progress Notes (Signed)
Office Visit Note  Patient: Sarah [name]             Date of Birth: [birth year]           MRN: [id number]             PCP: Annye Asa, MD Referring: Midge Minium, MD Visit Date: 12/24/2016 Occupation: Retired    Subjective:  Follow-up on osteoporosis   History of Present Illness: ANJELICA [name] is a 71 y.o. female with history of osteoporosis. She has history of severe gastroesophageal reflux. She's been having some discomfort off and on in her C-spine for which she uses a c-collar. She's also had some discomfort in her right CMC joint. She has known history of osteoarthritis of her knee joints and has ongoing pain and discomfort in her right knee joint.  Activities of Daily Living:  Patient reports morning stiffness for 0 minute.   Patient Denies nocturnal pain.  Difficulty dressing/grooming: Denies Difficulty climbing stairs: Denies Difficulty getting out of chair: Denies Difficulty using hands for taps, buttons, cutlery, and/or writing: Denies   Review of Systems  Constitutional: Negative for fatigue, night sweats, weight gain, weight loss and weakness.  HENT: Negative for mouth sores, trouble swallowing, trouble swallowing, mouth dryness and nose dryness.   Eyes: Negative for pain, redness, visual disturbance and dryness.  Respiratory: Negative for cough, shortness of breath and difficulty breathing.   Cardiovascular: Negative for chest pain, palpitations, hypertension, irregular heartbeat and swelling in legs/feet.  Gastrointestinal: Negative for blood in stool, constipation and diarrhea.  Endocrine: Negative for increased urination.  Genitourinary: Negative for vaginal dryness.  Musculoskeletal: Positive for arthralgias, joint pain and morning stiffness. Negative for joint swelling, myalgias, muscle weakness, muscle tenderness and myalgias.  Skin: Negative for color change, rash, hair loss, skin tightness, ulcers and sensitivity to sunlight.    Allergic/Immunologic: Negative for susceptible to infections.  Neurological: Negative for dizziness, memory loss and night sweats.  Hematological: Negative for swollen glands.  Psychiatric/Behavioral: Negative for depressed mood and sleep disturbance. The patient is not nervous/anxious.     PMFS History:  Patient Active Problem List   Diagnosis Date Noted  . Primary osteoarthritis of both hands 12/23/2016  . Primary osteoarthritis of both knees 12/23/2016  . Postural kyphosis of thoracic region 12/23/2016  . DJD (degenerative joint disease), cervical 12/23/2016  . History of gastroesophageal reflux (GERD) 12/23/2016  . History of hypertension 12/23/2016  . History of fracture of left hip 12/23/2016  . Physical exam 11/04/2016  . Environmental and seasonal allergies 06/27/2016  . Esophageal dysphagia 06/03/2016  . Osteoporosis 08/03/2014  . Hyperlipidemia 11/06/2013  . Hypertension 11/06/2013  . Hip fracture, left ([place]) 11/06/2013  . Environmental allergies 11/06/2013    Past Medical History:  Diagnosis Date  . Arthritis   . Asthma   . Cervical disc disease    bulging  . Diverticulitis   . Diverticulosis   . Hyperlipidemia   . Hypertension   . Osteopenia   . Osteoporosis   . Status post dilation of esophageal narrowing     Family History  Problem Relation Age of Onset  . Hypertension Mother   . Diabetes Father   . Hypertension Father   . Heart disease Father   . Diabetes Sister   . Hypertension Sister   . Thyroid disease Sister   . Heart disease Sister   . Throat cancer Paternal Uncle     smoker  . Lung cancer Cousin  smoker  . Colon cancer Neg Hx   . Pancreatic cancer Neg Hx   . Stomach cancer Neg Hx   . Liver disease Neg Hx    Past Surgical History:  Procedure Laterality Date  . ABDOMINAL HYSTERECTOMY  1995   TVH--still has ovaries  . ABDOMINAL SACROCOLPOPEXY  2007   Halban culdoplasty & Burch procedure--Dr. Quincy Simmonds  . APPENDECTOMY     Dr. Hassell Done   . arthroscopic shoulder Left    Dr. Durward Fortes, bone spur  . BLADDER SUSPENSION    . CERVICAL FUSION     C-4, C-5 fusion  . CHOLECYSTECTOMY     Dr. Georgina Quint  . ESOPHAGEAL DILATION    . HIP ARTHROPLASTY Left 11/07/2013   Procedure: ARTHROPLASTY BIPOLAR HIP;  Surgeon: Garald Balding, MD;  Location: [facility];  Service: Orthopedics;  Laterality: Left;  . KNEE ARTHROSCOPY Bilateral    Dr. Durward Fortes  . OTHER SURGICAL HISTORY  2011   posterior Colporrhaphy with Xenform biological graft--Dr. Quincy Simmonds  . RECTOCELE REPAIR  2011   posterior colporrhaphy with Xenform biological graft--Dr. Quincy Simmonds   Social History   Social History Narrative  . No narrative on file     Objective: Vital Signs: BP 126/80   Pulse 64   Resp 14   Ht 5\' 4"  (1.626 m)   Wt 153 lb (69.4 kg)   LMP 12/16/1993 (Approximate)   BMI 26.26 kg/m    Physical Exam  Constitutional: She is oriented to person, place, and time. She appears well-developed and well-nourished.  HENT:  Head: Normocephalic and atraumatic.  Eyes: Conjunctivae and EOM are normal.  Neck: Normal range of motion.  Cardiovascular: Normal rate, regular rhythm, normal heart sounds and intact distal pulses.   Pulmonary/Chest: Effort normal and breath sounds normal.  Abdominal: Soft. Bowel sounds are normal.  Lymphadenopathy:    She has no cervical adenopathy.  Neurological: She is alert and oriented to person, place, and time.  Skin: Skin is warm and dry. Capillary refill takes less than 2 seconds.  Psychiatric: She has a normal mood and affect. Her behavior is normal.  Nursing note and vitals reviewed.    Musculoskeletal Exam: C-spine and thoracic, lumbar spine good range of motion. Shoulder joints elbow joints wrist joint MCPs PIPs DIPs with good range of motion with no synovitis she is some osteoarthritic changes in her hands with CMC arthritis. Hip joints knee joints ankles MTPs PIPs with good range of motion she is some thickening of her  right knee joint. But no warmth swelling or effusion.  CDAI Exam: No CDAI exam completed.    Investigation: Findings:  09/11/2015 T score -2.8  BMD 0.654  Right femoral neck 09/11/2015 DEXA showed BMD 0.654, T score -2.8 in the right femoral neck. Fraxinus score hip 14.4% and major osteoporotic fracture 39.7%. There was decrease in BMD of -8.6% in the distal left radius, decrease in BMD 0.1% and lumbar region, -2.6% and right femur    Imaging: Mm Screening Breast Tomo Bilateral  Result Date: 12/03/2016 CLINICAL DATA:  Screening. EXAM: 2D DIGITAL SCREENING BILATERAL MAMMOGRAM WITH CAD AND ADJUNCT TOMO COMPARISON:  Previous exam(s). ACR Breast Density Category c: The breast tissue is heterogeneously dense, which may obscure small masses. FINDINGS: There are no findings suspicious for malignancy. Images were processed with CAD. IMPRESSION: No mammographic evidence of malignancy. A result letter of this screening mammogram will be mailed directly to the patient. RECOMMENDATION: Screening mammogram in one year. (Code:SM-B-01Y) BI-RADS CATEGORY  1: Negative. Electronically  Signed   By: Everlean Alstrom M.D.   On: 12/03/2016 07:52    Speciality Comments: No specialty comments available.    Procedures:  No procedures performed Allergies: Evista [raloxifene] and Fosamax [alendronate]   Assessment / Plan:     Visit Diagnoses: Osteoporosis - September 2016 T score -2.8, BMD 0.654 in the right femorall decrease of -8.6% in radius. I had detailed discussion regarding her bone density with her. She cannot take oral bisphosphonates due to severe reflux. I discussed the option of IV Reclast. Indications side effects contraindications were discussed at length by me and Dr. Koleen Nimrod our pharmacist. After detailed discussion she decided to take the handout with her and she will let us know if she wants to start on IV Reclast.  History of fracture of left hip - After a  bad fall   Postural kyphosis of  thoracic region: Exercises discussed  DDD cervical spine: Chronic pain  Primary osteoarthritis of both hands joint protection and muscle strengthening was discussed. I offered a [place] brace patient declined  Primary osteoarthritis of both knees - History of left anterior cruciate ligament tear in the past. She has chronic pain  GER  History of hypertension    Orders: No orders of the defined types were placed in this encounter.  No orders of the defined types were placed in this encounter.   Face-to-face time spent with patient was 30 minutes. 50% of time was spent in counseling and coordination of care.  Follow-Up Instructions: Return in about 6 months (around 06/23/2017) for Osteoporosis.   Bo Merino, MD  Note - This record has been created using Editor, commissioning.  Chart creation errors have been sought, but may not always  have been located. Such creation errors do not reflect on  the standard of medical care.

## 2016-12-24 ENCOUNTER — Ambulatory Visit (INDEPENDENT_AMBULATORY_CARE_PROVIDER_SITE_OTHER): Payer: Medicare Other | Admitting: Rheumatology

## 2016-12-24 ENCOUNTER — Encounter: Payer: Self-pay | Admitting: Rheumatology

## 2016-12-24 VITALS — BP 126/80 | HR 64 | Resp 14 | Ht 64.0 in | Wt 153.0 lb

## 2016-12-24 DIAGNOSIS — M19041 Primary osteoarthritis, right hand: Secondary | ICD-10-CM

## 2016-12-24 DIAGNOSIS — M19042 Primary osteoarthritis, left hand: Secondary | ICD-10-CM

## 2016-12-24 DIAGNOSIS — M5030 Other cervical disc degeneration, unspecified cervical region: Secondary | ICD-10-CM

## 2016-12-24 DIAGNOSIS — Z8719 Personal history of other diseases of the digestive system: Secondary | ICD-10-CM | POA: Diagnosis not present

## 2016-12-24 DIAGNOSIS — M810 Age-related osteoporosis without current pathological fracture: Secondary | ICD-10-CM

## 2016-12-24 DIAGNOSIS — Z8781 Personal history of (healed) traumatic fracture: Secondary | ICD-10-CM

## 2016-12-24 DIAGNOSIS — M4004 Postural kyphosis, thoracic region: Secondary | ICD-10-CM

## 2016-12-24 DIAGNOSIS — Z8679 Personal history of other diseases of the circulatory system: Secondary | ICD-10-CM

## 2016-12-24 DIAGNOSIS — M170 Bilateral primary osteoarthritis of knee: Secondary | ICD-10-CM

## 2016-12-24 DIAGNOSIS — M47812 Spondylosis without myelopathy or radiculopathy, cervical region: Secondary | ICD-10-CM

## 2016-12-24 NOTE — Patient Instructions (Signed)
Zoledronic Acid injection (Paget's Disease, Osteoporosis) °What is this medicine? °ZOLEDRONIC ACID (ZOE le dron ik AS id) lowers the amount of calcium loss from bone. It is used to treat Paget's disease and osteoporosis in women. °COMMON BRAND NAME(S): Reclast, Zometa °What should I tell my health care provider before I take this medicine? °They need to know if you have any of these conditions: °-aspirin-sensitive asthma °-cancer, especially if you are receiving medicines used to treat cancer °-dental disease or wear dentures °-infection °-kidney disease °-low levels of calcium in the blood °-past surgery on the parathyroid gland or intestines °-receiving corticosteroids like dexamethasone or prednisone °-an unusual or allergic reaction to zoledronic acid, other medicines, foods, dyes, or preservatives °-pregnant or trying to get pregnant °-breast-feeding °How should I use this medicine? °This medicine is for infusion into a vein. It is given by a health care professional in a hospital or clinic setting. °Talk to your pediatrician regarding the use of this medicine in children. This medicine is not approved for use in children. °What if I miss a dose? °It is important not to miss your dose. Call your doctor or health care professional if you are unable to keep an appointment. °What may interact with this medicine? °-certain antibiotics given by injection °-NSAIDs, medicines for pain and inflammation, like ibuprofen or naproxen °-some diuretics like bumetanide, furosemide °-teriparatide °What should I watch for while using this medicine? °Visit your doctor or health care professional for regular checkups. It may be some time before you see the benefit from this medicine. Do not stop taking your medicine unless your doctor tells you to. Your doctor may order blood tests or other tests to see how you are doing. °Women should inform their doctor if they wish to become pregnant or think they might be pregnant. There is a  potential for serious side effects to an unborn child. Talk to your health care professional or pharmacist for more information. °You should make sure that you get enough calcium and vitamin D while you are taking this medicine. Discuss the foods you eat and the vitamins you take with your health care professional. °Some people who take this medicine have severe bone, joint, and/or muscle pain. This medicine may also increase your risk for jaw problems or a broken thigh bone. Tell your doctor right away if you have severe pain in your jaw, bones, joints, or muscles. Tell your doctor if you have any pain that does not go away or that gets worse. °Tell your dentist and dental surgeon that you are taking this medicine. You should not have major dental surgery while on this medicine. See your dentist to have a dental exam and fix any dental problems before starting this medicine. Take good care of your teeth while on this medicine. Make sure you see your dentist for regular follow-up appointments. °What side effects may I notice from receiving this medicine? °Side effects that you should report to your doctor or health care professional as soon as possible: °-allergic reactions like skin rash, itching or hives, swelling of the face, lips, or tongue °-anxiety, confusion, or depression °-breathing problems °-changes in vision °-eye pain °-feeling faint or lightheaded, falls °-jaw pain, especially after dental work °-mouth sores °-muscle cramps, stiffness, or weakness °-redness, blistering, peeling or loosening of the skin, including inside the mouth °-trouble passing urine or change in the amount of urine °Side effects that usually do not require medical attention (report to your doctor or health care professional if   they continue or are bothersome): °-bone, joint, or muscle pain °-constipation °-diarrhea °-fever °-hair loss °-irritation at site where injected °-loss of appetite °-nausea, vomiting °-stomach  upset °-trouble sleeping °-trouble swallowing °-weak or tired °Where should I keep my medicine? °This drug is given in a hospital or clinic and will not be stored at home. °© 2017 Elsevier/Gold Standard (2014-04-30 14:19:57) ° °

## 2016-12-24 NOTE — Progress Notes (Signed)
Pharmacy Note  Subjective: Patient presents today to the [facility] to see Dr. Estanislado Pandy.  Patient has been diagnosed with osteoporosis.  Patient was seen by the pharmacist for counseling on Reclast.    Objective: T-score: -2.8 (09/11/15) Calcium: 9.6 mg/dL (11/04/16) CR: 0.83 mg/dL (11/04/16)  Assessment/Plan: Counseled patient that Reclast is an intravenous bisphosphonate that reduces bone turnover by inhibiting osteoclasts that chew up bone.  Counseled patient on purpose, proper use, and adverse effects of Reclast.  Reviewed with patient that Reclast is an infusion that is given once a year at the outpatient infusion center at [facility].  Provided patient with medication education material and answered all questions.  Patient does not want to initiate Reclast at this time.  She wants to read more about the medication prior to initiation.  Advised patient to call our office if she decides she wants to initiate Reclast.  Reviewed with patient that we will need an updated calcium level prior to initiation of Reclast and will need to confirm coverage with her insurance.  Patient voiced understanding.  Advised continued calcium/vitamin D supplementation for osteoporosis and weight bearing exercises.     Elisabeth Most, Pharm.D., BCPS Clinical Pharmacist Pager: [phone] Phone: [phone] 12/24/2016 10:08 AM

## 2016-12-25 ENCOUNTER — Ambulatory Visit: Payer: Medicare Other | Admitting: Family Medicine

## 2016-12-28 ENCOUNTER — Other Ambulatory Visit: Payer: Self-pay | Admitting: Obstetrics and Gynecology

## 2016-12-28 DIAGNOSIS — L900 Lichen sclerosus et atrophicus: Secondary | ICD-10-CM

## 2016-12-30 NOTE — Telephone Encounter (Signed)
Medication refill request: clobetasol ointment 0.05% Last AEX:  09/25/16 BS Next AEX: 10/01/17 Last MMG (if hormonal medication request): 12/02/16 BIRADS 1 negative Refill authorized: 09/13/15 #30g w/1 refill; today please advise

## 2017-01-08 ENCOUNTER — Ambulatory Visit (INDEPENDENT_AMBULATORY_CARE_PROVIDER_SITE_OTHER): Payer: Medicare Other | Admitting: Family Medicine

## 2017-01-08 ENCOUNTER — Encounter: Payer: Self-pay | Admitting: Family Medicine

## 2017-01-08 VITALS — BP 122/82 | HR 74 | Temp 98.0°F | Resp 16 | Ht 64.0 in | Wt 152.2 lb

## 2017-01-08 DIAGNOSIS — H6981 Other specified disorders of Eustachian tube, right ear: Secondary | ICD-10-CM | POA: Diagnosis not present

## 2017-01-08 NOTE — Progress Notes (Signed)
   Subjective:    Patient ID: Sarah [name], female    DOB: 1946-07-06, 71 y.o.   MRN: [id number]  HPI Ringing in R ear- sxs started Friday after going to gun range at the beach.  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  Was walking on beach that afternoon and noted ringing/whistling.  Monday had very unusual sound/sensation when blow drying her hair- 'like the helicopter thing when you put your car window down'.  Some discomfort.  No drainage from ear.   Review of Systems For ROS see HPI     Objective:   Physical Exam  Constitutional: She is oriented to person, place, and time. She appears well-developed and well-nourished. No distress.  HENT:  Head: Normocephalic and atraumatic.  Right Ear: Tympanic membrane is retracted.  Left Ear: Tympanic membrane normal.  Nose: Mucosal edema and rhinorrhea present. Right sinus exhibits no maxillary sinus tenderness and no frontal sinus tenderness. Left sinus exhibits no maxillary sinus tenderness and no frontal sinus tenderness.  Mouth/Throat: Mucous membranes are normal. Posterior oropharyngeal erythema (w/ PND) present.  TMs initially obscured by wax- this was successfully removed w/ curette  Eyes: Conjunctivae and EOM are normal. Pupils are equal, round, and reactive to light.  Neck: Normal range of motion. Neck supple.  Lymphadenopathy:    She has no cervical adenopathy.  Neurological: She is alert and oriented to person, place, and time. No cranial nerve deficit. Coordination normal.  Skin: Skin is warm and dry.  Psychiatric: She has a normal mood and affect. Her behavior is normal. Thought content normal.  Vitals reviewed.         Assessment & Plan:  Eustachian tube dysfunction- no evidence of TM perforation after wax was removed.  TM retracted, R side.  Add Flonase to daily Zyrtec.  Start OTC decongestant for 2-3 days to improve sxs.  Reviewed supportive care and red flags that should prompt return.  Pt expressed understanding and is in  agreement w/ plan.

## 2017-01-08 NOTE — Progress Notes (Signed)
Pre visit review using our clinic review tool, if applicable. No additional management support is needed unless otherwise documented below in the visit note. 

## 2017-01-08 NOTE — Patient Instructions (Signed)
Follow up as needed/scheduled Add the Flonase to the daily Zyrtec Start OTC decongestant for 2-3 days to improve your ear congestion Drink plenty of fluids Call with any questions or concerns Hang in there!!!

## 2017-01-27 ENCOUNTER — Ambulatory Visit (INDEPENDENT_AMBULATORY_CARE_PROVIDER_SITE_OTHER): Payer: Medicare Other | Admitting: Family Medicine

## 2017-01-27 ENCOUNTER — Encounter: Payer: Self-pay | Admitting: Family Medicine

## 2017-01-27 VITALS — BP 128/82 | HR 79 | Temp 98.4°F | Resp 16 | Ht 64.0 in | Wt 149.2 lb

## 2017-01-27 DIAGNOSIS — K219 Gastro-esophageal reflux disease without esophagitis: Secondary | ICD-10-CM | POA: Diagnosis not present

## 2017-01-27 DIAGNOSIS — R109 Unspecified abdominal pain: Secondary | ICD-10-CM

## 2017-01-27 LAB — POCT URINALYSIS DIPSTICK
Bilirubin, UA: NEGATIVE
Glucose, UA: NEGATIVE
Ketones, UA: NEGATIVE
NITRITE UA: NEGATIVE
PH UA: 6
Protein, UA: NEGATIVE
RBC UA: NEGATIVE
Spec Grav, UA: 1.01
UROBILINOGEN UA: 0.2

## 2017-01-27 MED ORDER — CIPROFLOXACIN HCL 500 MG PO TABS: 500.0000 mg | ORAL_TABLET | Freq: Two times a day (BID) | ORAL | 0 refills | Status: DC

## 2017-01-27 MED ORDER — PANTOPRAZOLE SODIUM 40 MG PO TBEC: 40.0000 mg | DELAYED_RELEASE_TABLET | Freq: Every day | ORAL | 0 refills | Status: DC

## 2017-01-27 NOTE — Patient Instructions (Signed)
Follow up as needed or scheduled Drink plenty of fluids Start the Cipro twice daily- take w/ food Start the Protonix once daily to help w/ reflux- hold the OTC Nexium Call with any questions or concerns Hang in there!!!

## 2017-01-27 NOTE — Progress Notes (Signed)
Pre visit review using our clinic review tool, if applicable. No additional management support is needed unless otherwise documented below in the visit note. 

## 2017-01-27 NOTE — Progress Notes (Signed)
   Subjective:    Patient ID: Sarah [name], female    DOB: July 21, 1946, 71 y.o.   MRN: [id number]  HPI abd pain- sxs started Thursday night.  Sudden, severe, woke her from sleep.  Since then, pain has been intermittent.  Pain is worse after eating but again woke her last night.  Pain is bilateral lower quadrants.  Hx of diverticulitis.  No burning w/ urination.  Denies urinary frequency or urgency.  Had sense of lower abdominal bloating prior to onset of pain.  Pt is not able to recall when she last had a complete BM.  No vomiting but will get 'waves of nausea' when the pain is severe.  Pain is not consistent w/ previous bouts of diverticulitis.  Pt reports lower abdominal pain improves w/ urination.    Also noted to have increased GERD and recent heartburn.   Review of Systems For ROS see HPI     Objective:   Physical Exam  Constitutional: She is oriented to person, place, and time. She appears well-developed and well-nourished. No distress.  HENT:  Head: Normocephalic and atraumatic.  Abdominal: Soft. Bowel sounds are normal. She exhibits no distension and no mass. There is tenderness (TTP over suprapubic area). There is no rebound and no guarding.  Neurological: She is alert and oriented to person, place, and time.  Skin: Skin is warm and dry. No rash noted. No erythema.  Psychiatric: She has a normal mood and affect. Her behavior is normal. Thought content normal.  Vitals reviewed.         Assessment & Plan:  GERD- deteriorated w/ recent increase in heartburn and gas.  Switch from OTC Nexium to daily Protonix until feeling better.  Reviewed lifestyle and dietary modifications to improve sxs.  Pt expressed understanding and is in agreement w/ plan.   Suprapubic pain- pain is not consistent w/ diverticulitis.  Pain over her bladder and the fact that it is relieved w/ urination are consistent w/ UTI.  Start Cipro.  Reviewed supportive care and red flags that should prompt return.  Pt  expressed understanding and is in agreement w/ plan.

## 2017-01-28 ENCOUNTER — Encounter: Payer: Self-pay | Admitting: Family Medicine

## 2017-01-28 LAB — URINE CULTURE: ORGANISM ID, BACTERIA: NO GROWTH

## 2017-01-29 ENCOUNTER — Encounter: Payer: Self-pay | Admitting: Family Medicine

## 2017-02-23 ENCOUNTER — Other Ambulatory Visit: Payer: Self-pay | Admitting: Family Medicine

## 2017-02-25 ENCOUNTER — Encounter: Payer: Self-pay | Admitting: Family Medicine

## 2017-03-24 ENCOUNTER — Telehealth (INDEPENDENT_AMBULATORY_CARE_PROVIDER_SITE_OTHER): Payer: Self-pay | Admitting: Orthopaedic Surgery

## 2017-03-24 NOTE — Telephone Encounter (Signed)
Patient called this morning stating that her dentist does not think that she needs to take antibiotics before any appointments with him.  She would like to know if Dr. Durward Fortes is okay with that.  CB#[phone].

## 2017-03-24 NOTE — Telephone Encounter (Signed)
Spoke with pt

## 2017-03-24 NOTE — Telephone Encounter (Signed)
Does not need antibiotics this far out from hip replacement

## 2017-03-24 NOTE — Telephone Encounter (Signed)
Please advise 

## 2017-04-01 ENCOUNTER — Encounter: Payer: Self-pay | Admitting: Family Medicine

## 2017-05-06 ENCOUNTER — Ambulatory Visit (INDEPENDENT_AMBULATORY_CARE_PROVIDER_SITE_OTHER): Payer: Medicare Other | Admitting: Family Medicine

## 2017-05-06 ENCOUNTER — Encounter: Payer: Self-pay | Admitting: Family Medicine

## 2017-05-06 VITALS — BP 128/78 | HR 60 | Temp 98.0°F | Resp 16 | Ht 64.0 in | Wt 149.4 lb

## 2017-05-06 DIAGNOSIS — J3089 Other allergic rhinitis: Secondary | ICD-10-CM | POA: Diagnosis not present

## 2017-05-06 DIAGNOSIS — Z1159 Encounter for screening for other viral diseases: Secondary | ICD-10-CM

## 2017-05-06 DIAGNOSIS — I10 Essential (primary) hypertension: Secondary | ICD-10-CM

## 2017-05-06 DIAGNOSIS — E785 Hyperlipidemia, unspecified: Secondary | ICD-10-CM | POA: Diagnosis not present

## 2017-05-06 MED ORDER — MONTELUKAST SODIUM 10 MG PO TABS: 10.0000 mg | ORAL_TABLET | Freq: Every day | ORAL | 3 refills | Status: DC

## 2017-05-06 NOTE — Patient Instructions (Signed)
Schedule your complete physical in 6 months and your annual wellness visit at the same time [facility] notify you of your lab results and make any changes if needed Continue to work on healthy diet and regular exercise- you can do it!!! Restart the Singulair nightly- IN ADDITION to the Zyrtec Use the inhaler as needed for cough If no improvement in shortness of breath or cough, it will then be time to add a daily controller medication Call with any questions or concerns Have a great summer!!!

## 2017-05-06 NOTE — Assessment & Plan Note (Signed)
Chronic problem.  Adequate control today.  Asymptomatic.  Check labs.  No anticipated med changes.  Will follow. 

## 2017-05-06 NOTE — Progress Notes (Signed)
   Subjective:    Patient ID: Sarah [name], female    DOB: [birth year], 71 y.o.   MRN: [id number]  HPI HTN- chronic problem, on Diovan 80mg  daily w/ adequate control today.  Denies CP.  + SOB due to allergies/asthma.  No HAs, visual changes, edema.  Hyperlipidemia- chronic problem, on Lipitor 10mg  daily.  No abd pain, N/V.  Allergies- pt has allergist, was seen 4/10 and changed Zyrtec to Allegra and restarted Patanase.  Pt had shortness of breath w/ minimal exertion on a beach trip and subsequently woke at night w/ shortness of breath.  Allegra was switched to Singulair after going back to allergist.  'for last 10 days i've been coughing up green gunk'.   Review of Systems For ROS see HPI     Objective:   Physical Exam  Constitutional: She is oriented to person, place, and time. She appears well-developed and well-nourished. No distress.  HENT:  Head: Normocephalic and atraumatic.  Eyes: Conjunctivae and EOM are normal. Pupils are equal, round, and reactive to light.  Neck: Normal range of motion. Neck supple. No thyromegaly present.  Cardiovascular: Normal rate, regular rhythm, normal heart sounds and intact distal pulses.   No murmur heard. Pulmonary/Chest: Effort normal and breath sounds normal. No respiratory distress.  Abdominal: Soft. She exhibits no distension. There is no tenderness.  Musculoskeletal: She exhibits no edema.  Lymphadenopathy:    She has no cervical adenopathy.  Neurological: She is alert and oriented to person, place, and time.  Skin: Skin is warm and dry.  Psychiatric: She has a normal mood and affect. Her behavior is normal.  Vitals reviewed.         Assessment & Plan:

## 2017-05-06 NOTE — Assessment & Plan Note (Signed)
Chronic problem.  Tolerating statin w/o difficulty.  Stressed need for healthy diet and regular exercise.  Check labs.  Adjust meds prn  

## 2017-05-06 NOTE — Progress Notes (Signed)
Pre visit review using our clinic review tool, if applicable. No additional management support is needed unless otherwise documented below in the visit note. 

## 2017-05-06 NOTE — Assessment & Plan Note (Signed)
Deteriorated.  Pt has allergist but feels that sxs are not well controlled and that their communication has been difficult.  Reviewed current regimen and discussed the need for both antihistamine and Singulair as they work on different cell pathways.  Continue nasal spray.  If no improvement in sxs, will need to add daily controller inhaler.  Reviewed supportive care and red flags that should prompt return.  Pt expressed understanding and is in agreement w/ plan.

## 2017-05-07 LAB — HEPATIC FUNCTION PANEL
ALT: 15 U/L (ref 0–35)
AST: 19 U/L (ref 0–37)
Albumin: 4.6 g/dL (ref 3.5–5.2)
Alkaline Phosphatase: 65 U/L (ref 39–117)
BILIRUBIN DIRECT: 0.1 mg/dL (ref 0.0–0.3)
BILIRUBIN TOTAL: 0.6 mg/dL (ref 0.2–1.2)
TOTAL PROTEIN: 7 g/dL (ref 6.0–8.3)

## 2017-05-07 LAB — CBC WITH DIFFERENTIAL/PLATELET
BASOS PCT: 1 % (ref 0.0–3.0)
Basophils Absolute: 0 10*3/uL (ref 0.0–0.1)
EOS ABS: 0.2 10*3/uL (ref 0.0–0.7)
Eosinophils Relative: 5 % (ref 0.0–5.0)
HEMATOCRIT: 40.3 % (ref 36.0–46.0)
HEMOGLOBIN: 13.6 g/dL (ref 12.0–15.0)
LYMPHS PCT: 36.1 % (ref 12.0–46.0)
Lymphs Abs: 1.3 10*3/uL (ref 0.7–4.0)
MCHC: 33.8 g/dL (ref 30.0–36.0)
MCV: 89.3 fl (ref 78.0–100.0)
Monocytes Absolute: 0.3 10*3/uL (ref 0.1–1.0)
Monocytes Relative: 8.1 % (ref 3.0–12.0)
Neutro Abs: 1.9 10*3/uL (ref 1.4–7.7)
Neutrophils Relative %: 49.8 % (ref 43.0–77.0)
Platelets: 218 10*3/uL (ref 150.0–400.0)
RBC: 4.51 Mil/uL (ref 3.87–5.11)
RDW: 14.5 % (ref 11.5–15.5)
WBC: 3.7 10*3/uL — AB (ref 4.0–10.5)

## 2017-05-07 LAB — LIPID PANEL
CHOL/HDL RATIO: 3
Cholesterol: 204 mg/dL — ABNORMAL HIGH (ref 0–200)
HDL: 70.4 mg/dL (ref >39.00)
LDL Cholesterol: 116 mg/dL — ABNORMAL HIGH (ref 0–99)
NonHDL: 133.99
TRIGLYCERIDES: 88 mg/dL (ref 0.0–149.0)
VLDL: 17.6 mg/dL (ref 0.0–40.0)

## 2017-05-07 LAB — BASIC METABOLIC PANEL
BUN: 15 mg/dL (ref 6–23)
CHLORIDE: 105 meq/L (ref 96–112)
CO2: 30 mEq/L (ref 19–32)
Calcium: 9.8 mg/dL (ref 8.4–10.5)
Creatinine, Ser: 0.98 mg/dL (ref 0.40–1.20)
GFR: 59.43 mL/min — ABNORMAL LOW (ref >60.00)
Glucose, Bld: 85 mg/dL (ref 70–99)
POTASSIUM: 4.4 meq/L (ref 3.5–5.1)
Sodium: 143 mEq/L (ref 135–145)

## 2017-05-07 LAB — TSH: TSH: 1.32 u[IU]/mL (ref 0.35–4.50)

## 2017-05-07 LAB — HEPATITIS C ANTIBODY: HCV Ab: NEGATIVE

## 2017-06-09 ENCOUNTER — Encounter: Payer: Self-pay | Admitting: Family Medicine

## 2017-06-09 ENCOUNTER — Ambulatory Visit (INDEPENDENT_AMBULATORY_CARE_PROVIDER_SITE_OTHER): Payer: Medicare Other | Admitting: Family Medicine

## 2017-06-09 VITALS — BP 123/80 | HR 57 | Temp 98.0°F | Resp 16 | Ht 64.0 in | Wt 151.1 lb

## 2017-06-09 DIAGNOSIS — R197 Diarrhea, unspecified: Secondary | ICD-10-CM | POA: Diagnosis not present

## 2017-06-09 LAB — CBC WITH DIFFERENTIAL/PLATELET
BASOS PCT: 0.8 % (ref 0.0–3.0)
Basophils Absolute: 0 10*3/uL (ref 0.0–0.1)
Eosinophils Absolute: 0.1 10*3/uL (ref 0.0–0.7)
Eosinophils Relative: 2.9 % (ref 0.0–5.0)
HCT: 39 % (ref 36.0–46.0)
Hemoglobin: 13.4 g/dL (ref 12.0–15.0)
LYMPHS ABS: 1.4 10*3/uL (ref 0.7–4.0)
Lymphocytes Relative: 29.8 % (ref 12.0–46.0)
MCHC: 34.3 g/dL (ref 30.0–36.0)
MCV: 89.9 fl (ref 78.0–100.0)
MONO ABS: 0.3 10*3/uL (ref 0.1–1.0)
Monocytes Relative: 6.7 % (ref 3.0–12.0)
NEUTROS ABS: 2.8 10*3/uL (ref 1.4–7.7)
NEUTROS PCT: 59.8 % (ref 43.0–77.0)
PLATELETS: 199 10*3/uL (ref 150.0–400.0)
RBC: 4.34 Mil/uL (ref 3.87–5.11)
RDW: 13.8 % (ref 11.5–15.5)
WBC: 4.7 10*3/uL (ref 4.0–10.5)

## 2017-06-09 LAB — HEPATIC FUNCTION PANEL
ALBUMIN: 4.2 g/dL (ref 3.5–5.2)
ALK PHOS: 64 U/L (ref 39–117)
ALT: 17 U/L (ref 0–35)
AST: 18 U/L (ref 0–37)
Bilirubin, Direct: 0.1 mg/dL (ref 0.0–0.3)
Total Bilirubin: 0.5 mg/dL (ref 0.2–1.2)
Total Protein: 6.5 g/dL (ref 6.0–8.3)

## 2017-06-09 LAB — BASIC METABOLIC PANEL
BUN: 13 mg/dL (ref 6–23)
CHLORIDE: 106 meq/L (ref 96–112)
CO2: 29 mEq/L (ref 19–32)
Calcium: 9.6 mg/dL (ref 8.4–10.5)
Creatinine, Ser: 0.92 mg/dL (ref 0.40–1.20)
GFR: 63.91 mL/min (ref >60.00)
GLUCOSE: 89 mg/dL (ref 70–99)
POTASSIUM: 4.2 meq/L (ref 3.5–5.1)
Sodium: 141 mEq/L (ref 135–145)

## 2017-06-09 LAB — TSH: TSH: 1.41 u[IU]/mL (ref 0.35–4.50)

## 2017-06-09 NOTE — Patient Instructions (Signed)
Follow up as needed/scheduled We'll notify you of your lab results and make any changes if needed Continue to drink plenty of fluids Make sure you are eating plenty of fiber (to bulk up your stool) and take daily probiotics to make sure you are retaining some of the good bacteria in your gut Call with any questions or concerns Hang in there!!!

## 2017-06-09 NOTE — Progress Notes (Signed)
Pre visit review using our clinic review tool, if applicable. No additional management support is needed unless otherwise documented below in the visit note. 

## 2017-06-09 NOTE — Progress Notes (Signed)
   Subjective:    Patient ID: Sarah [name], female    DOB: 04/17/1946, 71 y.o.   MRN: [id number]  HPI Diarrhea- pt reports sxs started ~6 weeks ago.  Will have cup of tea at 5am and then from 6am-10am will 'be in the bathroom until there's nothing left'.  Denies feeling sick- no abd cramping, no blood in stool.  No fevers.  Pt reports sxs occur '5 out of 7 days a week'.  Stools are very watery.  Will have normal BMs later in the day.  Pt reports increased stress/anxiety recently.  Pt has hx of IBS-D, C diff   Review of Systems For ROS see HPI     Objective:   Physical Exam  Constitutional: She is oriented to person, place, and time. She appears well-developed and well-nourished. No distress.  HENT:  Head: Normocephalic and atraumatic.  MMM  Neck: Neck supple.  Cardiovascular: Normal rate, regular rhythm and intact distal pulses.   Pulmonary/Chest: Effort normal and breath sounds normal. No respiratory distress. She has no wheezes. She has no rales.  Abdominal: Soft. She exhibits no distension. There is no tenderness. There is no rebound.  Hyperactive BS  Lymphadenopathy:    She has no cervical adenopathy.  Neurological: She is alert and oriented to person, place, and time.  Skin: Skin is warm and dry.  Vitals reviewed.         Assessment & Plan:  Diarrhea- new.  Pt reports ~5 episodes/week x6 weeks.  She states that after her AM sxs subside, her BMs are normal.  Pt has hx of C diff and IBS-D.  Will get labs to assess electrolytes and stool studies to r/o infxn.  Reviewed supportive care and red flags that should prompt return.  Pt expressed understanding and is in agreement w/ plan.

## 2017-06-11 ENCOUNTER — Other Ambulatory Visit: Payer: Self-pay | Admitting: Family Medicine

## 2017-06-11 DIAGNOSIS — R197 Diarrhea, unspecified: Secondary | ICD-10-CM

## 2017-06-11 LAB — CLOSTRIDIUM DIFFICILE BY PCR: Toxigenic C. Difficile by PCR: NOT DETECTED

## 2017-06-14 LAB — STOOL CULTURE

## 2017-06-23 ENCOUNTER — Encounter: Payer: Self-pay | Admitting: Family Medicine

## 2017-06-30 ENCOUNTER — Encounter: Payer: Self-pay | Admitting: Family Medicine

## 2017-06-30 MED ORDER — LOSARTAN POTASSIUM 100 MG PO TABS: 100.0000 mg | ORAL_TABLET | Freq: Every day | ORAL | 1 refills | Status: DC

## 2017-07-09 ENCOUNTER — Telehealth: Payer: Self-pay | Admitting: Family Medicine

## 2017-07-09 ENCOUNTER — Encounter: Payer: Self-pay | Admitting: Family Medicine

## 2017-07-09 ENCOUNTER — Ambulatory Visit (INDEPENDENT_AMBULATORY_CARE_PROVIDER_SITE_OTHER): Payer: Medicare Other | Admitting: Family Medicine

## 2017-07-09 DIAGNOSIS — K5792 Diverticulitis of intestine, part unspecified, without perforation or abscess without bleeding: Secondary | ICD-10-CM

## 2017-07-09 MED ORDER — METRONIDAZOLE 500 MG PO TABS: 500.0000 mg | ORAL_TABLET | Freq: Three times a day (TID) | ORAL | 0 refills | Status: DC

## 2017-07-09 MED ORDER — CIPROFLOXACIN HCL 500 MG PO TABS: 500.0000 mg | ORAL_TABLET | Freq: Two times a day (BID) | ORAL | 0 refills | Status: DC

## 2017-07-09 NOTE — Progress Notes (Signed)
   Subjective:    Patient ID: [place] COHICK, female    DOB: 05-14-1946, 71 y.o.   MRN: [id number]  HPI abd pain- pt developed chills on Monday, had them again yesterday.  Temp ranged 99-101 yesterday.  Today had abdominal pain- TTP.  Mild nausea, no vomiting.  No changes to bowel movements.  No known sick contacts.  Pt has hx of diverticulosis.   Review of Systems For ROS see HPI     Objective:   Physical Exam  Constitutional: She is oriented to person, place, and time. She appears well-developed and well-nourished. No distress.  HENT:  Head: Normocephalic and atraumatic.  MMM  Neck: Neck supple.  Cardiovascular: Normal rate, regular rhythm and intact distal pulses.   Pulmonary/Chest: Effort normal and breath sounds normal. No respiratory distress. She has no wheezes. She has no rales.  Abdominal: Soft. Bowel sounds are normal. She exhibits no distension. There is tenderness (TTP over L side). There is guarding (voluntary guarding due to pain). There is no rebound.  Lymphadenopathy:    She has no cervical adenopathy.  Neurological: She is alert and oriented to person, place, and time.  Skin: Skin is warm and dry.  Vitals reviewed.         Assessment & Plan:  Diverticulitis- new.  Pt's sxs and PE consistent w/ diverticulitis.  Pt has known diverticulosis.  Start Cipro and Flagyl.  Reviewed supportive care and red flags that should prompt return.  Pt expressed understanding and is in agreement w/ plan.

## 2017-07-09 NOTE — Progress Notes (Signed)
Pre visit review using our clinic review tool, if applicable. No additional management support is needed unless otherwise documented below in the visit note. 

## 2017-07-09 NOTE — Patient Instructions (Signed)
Follow up as needed/scheduled Start the Cipro and Flagyl as directed- take w/ food You need to eat a clear diet- jello, broth, applesauce, LOTS of fluids Once the stomach pain has improved- advance your diet to bland things like chicken, noodles, potatoes, etc If the pain worsens or doesn't improve- please let me know! Hang in there!!!

## 2017-07-09 NOTE — Telephone Encounter (Signed)
Noted  

## 2017-07-09 NOTE — Telephone Encounter (Signed)
[facility] Patient Name: AURIA MCKINLAY DOB: 1946/05/15 Initial Comment Caller states she has abd pain Nurse Assessment Nurse: Vallery Sa, RN, [place] Date/Time (Eastern Time): 07/09/2017 8:19:33 AM Confirm and document reason for call. If symptomatic, describe symptoms. ---Stanton Kidney states she developed centralized abdominal pain this morning (pain rated as a 5 on the 1 to 10 scale). No hernias. No fever, vomiting or diarrhea. Alert and responsive. Does the patient have any new or worsening symptoms? ---Yes Will a triage be completed? ---Yes Related visit to physician within the last 2 weeks? ---No Does the PT have any chronic conditions? (i.e. diabetes, asthma, etc.) ---Yes List chronic conditions. ---High Blood Pressure, Asthma, Reflux Is this a behavioral health or substance abuse call? ---No Guidelines Guideline Title Affirmed Question Affirmed Notes Abdominal Pain - Female [1] MILD-MODERATE pain AND [2] constant AND [3] present > 2 hours Final Disposition User See Physician within 4 Hours (or PCP triage) Vallery Sa, RN, [place] Scheduled for 9:45am appointment with Dr. Birdie Riddle.  Referrals REFERRED TO PCP OFFICE Disagree/Comply: Comply

## 2017-07-10 ENCOUNTER — Encounter: Payer: Self-pay | Admitting: Family Medicine

## 2017-07-10 MED ORDER — AMOXICILLIN-POT CLAVULANATE 875-125 MG PO TABS: 1.0000 | ORAL_TABLET | Freq: Three times a day (TID) | ORAL | 0 refills | Status: DC

## 2017-07-22 ENCOUNTER — Ambulatory Visit (INDEPENDENT_AMBULATORY_CARE_PROVIDER_SITE_OTHER): Payer: Medicare Other | Admitting: Physician Assistant

## 2017-07-22 ENCOUNTER — Encounter: Payer: Self-pay | Admitting: Physician Assistant

## 2017-07-22 ENCOUNTER — Encounter: Payer: Self-pay | Admitting: Family Medicine

## 2017-07-22 VITALS — BP 124/80 | HR 63 | Temp 98.2°F | Resp 14 | Ht 64.0 in | Wt 147.0 lb

## 2017-07-22 DIAGNOSIS — R197 Diarrhea, unspecified: Secondary | ICD-10-CM

## 2017-07-22 DIAGNOSIS — K529 Noninfective gastroenteritis and colitis, unspecified: Secondary | ICD-10-CM

## 2017-07-22 LAB — COMPREHENSIVE METABOLIC PANEL
ALBUMIN: 4.3 g/dL (ref 3.5–5.2)
ALK PHOS: 54 U/L (ref 39–117)
ALT: 16 U/L (ref 0–35)
AST: 17 U/L (ref 0–37)
BUN: 12 mg/dL (ref 6–23)
CHLORIDE: 105 meq/L (ref 96–112)
CO2: 28 mEq/L (ref 19–32)
CREATININE: 0.94 mg/dL (ref 0.40–1.20)
Calcium: 9.6 mg/dL (ref 8.4–10.5)
GFR: 62.32 mL/min (ref >60.00)
GLUCOSE: 93 mg/dL (ref 70–99)
Potassium: 4.2 mEq/L (ref 3.5–5.1)
SODIUM: 142 meq/L (ref 135–145)
TOTAL PROTEIN: 7.2 g/dL (ref 6.0–8.3)
Total Bilirubin: 0.5 mg/dL (ref 0.2–1.2)

## 2017-07-22 LAB — CBC WITH DIFFERENTIAL/PLATELET
Basophils Absolute: 0 10*3/uL (ref 0.0–0.1)
Basophils Relative: 0.7 % (ref 0.0–3.0)
EOS ABS: 0.2 10*3/uL (ref 0.0–0.7)
Eosinophils Relative: 3.2 % (ref 0.0–5.0)
HCT: 41.9 % (ref 36.0–46.0)
HEMOGLOBIN: 14.2 g/dL (ref 12.0–15.0)
Lymphocytes Relative: 26.2 % (ref 12.0–46.0)
Lymphs Abs: 1.4 10*3/uL (ref 0.7–4.0)
MCHC: 33.8 g/dL (ref 30.0–36.0)
MCV: 89.5 fl (ref 78.0–100.0)
MONO ABS: 0.3 10*3/uL (ref 0.1–1.0)
Monocytes Relative: 5 % (ref 3.0–12.0)
Neutro Abs: 3.5 10*3/uL (ref 1.4–7.7)
Neutrophils Relative %: 64.9 % (ref 43.0–77.0)
Platelets: 282 10*3/uL (ref 150.0–400.0)
RBC: 4.68 Mil/uL (ref 3.87–5.11)
RDW: 13.8 % (ref 11.5–15.5)
WBC: 5.4 10*3/uL (ref 4.0–10.5)

## 2017-07-22 NOTE — Patient Instructions (Signed)
Please go to the lab for blood work. I will call you with your results.   We are assessing if there is anything major going on giving chronic, recurrent symptoms versus a new viral enteritis/intolerance to the new foods you had this weekend. Keep well-hydrated. Get plenty of rest. Please start a bland diet and continue the probiotic.  I am sending stool for further testing. We will monitor symptoms over the next 24 hours. If not calming down or any worsening we will start Flagyl while waiting on stool study results.    Food Choices to Help Relieve Diarrhea, Adult When you have diarrhea, the foods you eat and your eating habits are very important. Choosing the right foods and drinks can help:  Relieve diarrhea.  Replace lost fluids and nutrients.  Prevent dehydration.  What general guidelines should I follow? Relieving diarrhea  Choose foods with less than 2 g or .07 oz. of fiber per serving.  Limit fats to less than 8 tsp (38 g or 1.34 oz.) a day.  Avoid the following: ? Foods and beverages sweetened with high-fructose corn syrup, honey, or sugar alcohols such as xylitol, sorbitol, and mannitol. ? Foods that contain a lot of fat or sugar. ? Fried, greasy, or spicy foods. ? High-fiber grains, breads, and cereals. ? Raw fruits and vegetables.  Eat foods that are rich in probiotics. These foods include dairy products such as yogurt and fermented milk products. They help increase healthy bacteria in the stomach and intestines (gastrointestinal tract, or GI tract).  If you have lactose intolerance, avoid dairy products. These may make your diarrhea worse.  Take medicine to help stop diarrhea (antidiarrheal medicine) only as told by your health care provider. Replacing nutrients  Eat small meals or snacks every 3-4 hours.  Eat bland foods, such as white rice, toast, or baked potato, until your diarrhea starts to get better. Gradually reintroduce nutrient-rich foods as tolerated or  as told by your health care provider. This includes: ? Well-cooked protein foods. ? Peeled, seeded, and soft-cooked fruits and vegetables. ? Low-fat dairy products.  Take vitamin and mineral supplements as told by your health care provider. Preventing dehydration   Start by sipping water or a special solution to prevent dehydration (oral rehydration solution, ORS). Urine that is clear or pale yellow means that you are getting enough fluid.  Try to drink at least 8-10 cups of fluid each day to help replace lost fluids.  You may add other liquids in addition to water, such as clear juice or decaffeinated sports drinks, as tolerated or as told by your health care provider.  Avoid drinks with caffeine, such as coffee, tea, or soft drinks.  Avoid alcohol. What foods are recommended? The items listed may not be a complete list. Talk with your health care provider about what dietary choices are best for you. Grains White rice. White, [place], or pita breads (fresh or toasted), including plain rolls, buns, or bagels. White pasta. Saltine, soda, or graham crackers. Pretzels. Low-fiber cereal. Cooked cereals made with water (such as cornmeal, farina, or cream cereals). Plain muffins. Matzo. Melba toast. Zwieback. Vegetables Potatoes (without the skin). Most well-cooked and canned vegetables without skins or seeds. Tender lettuce. Fruits Apple sauce. Fruits canned in juice. Cooked apricots, cherries, grapefruit, peaches, pears, or plums. Fresh bananas and cantaloupe. Meats and other protein foods Baked or boiled chicken. Eggs. Tofu. Fish. Seafood. Smooth nut butters. Ground or well-cooked tender beef, ham, veal, lamb, pork, or poultry. Dairy Plain yogurt,  kefir, and unsweetened liquid yogurt. Lactose-free milk, buttermilk, skim milk, or soy milk. Low-fat or nonfat hard cheese. Beverages Water. Low-calorie sports drinks. Fruit juices without pulp. Strained tomato and vegetable juices. Decaffeinated  teas. Sugar-free beverages not sweetened with sugar alcohols. Oral rehydration solutions, if approved by your health care provider. Seasoning and other foods Bouillon, broth, or soups made from recommended foods. What foods are not recommended? The items listed may not be a complete list. Talk with your health care provider about what dietary choices are best for you. Grains Whole grain, whole wheat, bran, or rye breads, rolls, pastas, and crackers. Wild or brown rice. Whole grain or bran cereals. Barley. Oats and oatmeal. Corn tortillas or taco shells. Granola. Popcorn. Vegetables Raw vegetables. Fried vegetables. Cabbage, broccoli, Brussels sprouts, artichokes, baked beans, beet greens, corn, kale, legumes, peas, sweet potatoes, and yams. Potato skins. Cooked spinach and cabbage. Fruits Dried fruit, including raisins and dates. Raw fruits. Stewed or dried prunes. Canned fruits with syrup. Meat and other protein foods Fried or fatty meats. Deli meats. Chunky nut butters. Nuts and seeds. Beans and lentils. Berniece Salines. Hot dogs. Sausage. Dairy High-fat cheeses. Whole milk, chocolate milk, and beverages made with milk, such as milk shakes. Half-and-half. Cream. sour cream. Ice cream. Beverages Caffeinated beverages (such as coffee, tea, soda, or energy drinks). Alcoholic beverages. Fruit juices with pulp. Prune juice. Soft drinks sweetened with high-fructose corn syrup or sugar alcohols. High-calorie sports drinks. Fats and oils Butter. Cream sauces. Margarine. Salad oils. Plain salad dressings. Olives. Avocados. Mayonnaise. Sweets and desserts Sweet rolls, doughnuts, and sweet breads. Sugar-free desserts sweetened with sugar alcohols such as xylitol and sorbitol. Seasoning and other foods Honey. Hot sauce. Chili powder. Gravy. Cream-based or milk-based soups. Pancakes and waffles. Summary  When you have diarrhea, the foods you eat and your eating habits are very important.  Make sure you get at  least 8-10 cups of fluid each day, or enough to keep your urine clear or pale yellow.  Eat bland foods and gradually reintroduce healthy, nutrient-rich foods as tolerated, or as told by your health care provider.  Avoid high-fiber, fried, greasy, or spicy foods. This information is not intended to replace advice given to you by your health care provider. Make sure you discuss any questions you have with your health care provider. Document Released: 02/22/2004 Document Revised: 11/29/2016 Document Reviewed: 11/29/2016 Elsevier Interactive Patient Education  2017 [employer].

## 2017-07-22 NOTE — Progress Notes (Signed)
Pre visit review using our clinic review tool, if applicable. No additional management support is needed unless otherwise documented below in the visit note. 

## 2017-07-22 NOTE — Progress Notes (Signed)
Patient presents to clinic today c/o 1.5 days of recurrent diarrhea. Patient notes starting with loose, frequent stools yesterday, averaging about 6 BM per day. Was recently treated for diverticulitis -- initially with Cipro and Flagyl that was changed to Augmentin. States she had worsened diarrhea with Augmentin but things had finally started resolving before recurring 2 days ago. Denies fever, chills, heart burn, nausea/vomiting. Denies abdominal pain. Denies recent travel or sick contact. Does note drinking whole milk (non-pasteurized) from the farm this past weekend and wonders if it could be contributing.   Past Medical History:  Diagnosis Date  . Arthritis   . Asthma   . Cervical disc disease    bulging  . Diverticulitis   . Diverticulosis   . Hyperlipidemia   . Hypertension   . Osteopenia   . Osteoporosis   . Status post dilation of esophageal narrowing     Current Outpatient Prescriptions on File Prior to Visit  Medication Sig Dispense Refill  . atorvastatin (LIPITOR) 10 MG tablet Take 1 tablet by mouth daily.  3  . azelastine (ASTELIN) 0.1 % nasal spray U 1 SPR IEN BID PRN  5  . beta carotene w/minerals (OCUVITE) tablet Take 1 tablet by mouth daily.    . Calcium Citrate (CITRACAL PO) Take 2 tablets by mouth daily.    . cetirizine (ZYRTEC) 10 MG tablet Take 1 tablet (10 mg total) by mouth daily. 30 tablet 11  . clobetasol ointment (TEMOVATE) 0.05 % Apply topically 2 (two) times daily as needed. 30 g 0  . Coenzyme Q10 (COQ10) 200 MG CAPS Take 1 tablet by mouth daily.    Marland Kitchen EPIPEN 2-PAK 0.3 MG/0.3ML SOAJ injection Inject 1 mg into the muscle as needed. Reported on 06/28/2016    . Ergocalciferol (VITAMIN D2) 400 units TABS Take 1 capsule by mouth daily.    Marland Kitchen esomeprazole (NEXIUM) 20 MG capsule Take 20 mg by mouth daily at 12 noon.    . fish oil-omega-3 fatty acids 1000 MG capsule Take 2 g by mouth daily.    Marland Kitchen lactobacillus acidophilus (BACID) TABS tablet Take 2 tablets by mouth 2  (two) times a week.    . losartan (COZAAR) 100 MG tablet Take 1 tablet (100 mg total) by mouth daily. 90 tablet 1  . montelukast (SINGULAIR) 10 MG tablet Take 1 tablet (10 mg total) by mouth at bedtime. 30 tablet 3  . Multiple Vitamin (MULTIVITAMIN) capsule Take 1 capsule by mouth daily.    . Olopatadine HCl (PATANASE) 0.6 % SOLN Place 1 puff into the nose as needed. Reported on 06/27/2016    . PROAIR HFA 108 (90 Base) MCG/ACT inhaler INL 2 PFS PO Q 4 TO 6 H PRN  0  . Vitamin Mixture (ESTER-C) 500-60 MG TABS Take 1 capsule by mouth daily.     No current facility-administered medications on file prior to visit.     Allergies  Allergen Reactions  . Evista [Raloxifene]     Respiratory infections, shortness of breath(not anaphylaxis), leg heaviness.  . Fosamax [Alendronate] Other (See Comments)    "Due to Esophagus problems"--narrow    Family History  Problem Relation Age of Onset  . Hypertension Mother   . Diabetes Father   . Hypertension Father   . Heart disease Father   . Diabetes Sister   . Hypertension Sister   . Thyroid disease Sister   . Heart disease Sister   . Throat cancer Paternal Uncle  smoker  . Lung cancer Cousin        smoker  . Colon cancer Neg Hx   . Pancreatic cancer Neg Hx   . Stomach cancer Neg Hx   . Liver disease Neg Hx     Social History   Social History  . Marital status: Married    Spouse name: N/A  . Number of children: 1  . Years of education: N/A   Occupational History  . retired    Social History Main Topics  . Smoking status: Never Smoker  . Smokeless tobacco: Never Used  . Alcohol use 0.0 oz/week     Comment: 7 glasses of wine per week  . Drug use: No  . Sexual activity: Yes    Partners: Male    Birth control/ protection: Other-see comments, Post-menopausal     Comment: TVH--Still has ovaries   Other Topics Concern  . None   Social History Narrative  . None   Review of Systems - See HPI.  All other ROS are  negative.  BP 124/80   Pulse 63   Temp 98.2 F (36.8 C) (Oral)   Resp 14   Ht '5\' 4"'  (1.626 m)   Wt 147 lb (66.7 kg)   LMP 12/16/1993 (Approximate)   SpO2 95%   BMI 25.23 kg/m   Physical Exam  Constitutional: She is oriented to person, place, and time and well-developed, well-nourished, and in no distress.  HENT:  Head: Normocephalic and atraumatic.  Eyes: Conjunctivae are normal.  Neck: Neck supple.  Cardiovascular: Normal rate, regular rhythm, normal heart sounds and intact distal pulses.   Pulmonary/Chest: Effort normal and breath sounds normal. No respiratory distress. She has no wheezes. She has no rales. She exhibits no tenderness.  Abdominal: Soft. Bowel sounds are normal. She exhibits no distension. There is no tenderness. There is no rebound.  Neurological: She is alert and oriented to person, place, and time.  Skin: Skin is warm and dry. No rash noted.  Psychiatric: Affect normal.  Vitals reviewed.   Recent Results (from the past 2160 hour(s))  Hepatitis C Antibody     Status: None   Collection Time: 05/06/17  9:02 AM  Result Value Ref Range   HCV Ab NEGATIVE NEGATIVE  Lipid panel     Status: Abnormal   Collection Time: 05/06/17  9:02 AM  Result Value Ref Range   Cholesterol 204 (H) 0 - 200 mg/dL    Comment: ATP III Classification       Desirable:  < 200 mg/dL               Borderline High:  200 - 239 mg/dL          High:  > = 240 mg/dL   Triglycerides 88.0 0.0 - 149.0 mg/dL    Comment: Normal:  <150 mg/dLBorderline High:  150 - 199 mg/dL   HDL 70.40 >39.00 mg/dL   VLDL 17.6 0.0 - 40.0 mg/dL   LDL Cholesterol 116 (H) 0 - 99 mg/dL   Total CHOL/HDL Ratio 3     Comment:                Men          Women1/2 Average Risk     3.4          3.3Average Risk          5.0          4.42X Average Risk  9.6          7.13X Average Risk          15.0          11.0                       NonHDL 133.99     Comment: NOTE:  Non-HDL goal should be 30 mg/dL higher than  patient's LDL goal (i.e. LDL goal of < 70 mg/dL, would have non-HDL goal of < 100 mg/dL)  Basic metabolic panel     Status: Abnormal   Collection Time: 05/06/17  9:02 AM  Result Value Ref Range   Sodium 143 135 - 145 mEq/L   Potassium 4.4 3.5 - 5.1 mEq/L   Chloride 105 96 - 112 mEq/L   CO2 30 19 - 32 mEq/L   Glucose, Bld 85 70 - 99 mg/dL   BUN 15 6 - 23 mg/dL   Creatinine, Ser 0.98 0.40 - 1.20 mg/dL   Calcium 9.8 8.4 - 10.5 mg/dL   GFR 59.43 (L) >60.00 mL/min  Hepatic function panel     Status: None   Collection Time: 05/06/17  9:02 AM  Result Value Ref Range   Total Bilirubin 0.6 0.2 - 1.2 mg/dL   Bilirubin, Direct 0.1 0.0 - 0.3 mg/dL   Alkaline Phosphatase 65 39 - 117 U/L   AST 19 0 - 37 U/L   ALT 15 0 - 35 U/L   Total Protein 7.0 6.0 - 8.3 g/dL   Albumin 4.6 3.5 - 5.2 g/dL  TSH     Status: None   Collection Time: 05/06/17  9:02 AM  Result Value Ref Range   TSH 1.32 0.35 - 4.50 uIU/mL  CBC with Differential/Platelet     Status: Abnormal   Collection Time: 05/06/17  9:02 AM  Result Value Ref Range   WBC 3.7 (L) 4.0 - 10.5 K/uL   RBC 4.51 3.87 - 5.11 Mil/uL   Hemoglobin 13.6 12.0 - 15.0 g/dL   HCT 40.3 36.0 - 46.0 %   MCV 89.3 78.0 - 100.0 fl   MCHC 33.8 30.0 - 36.0 g/dL   RDW 14.5 11.5 - 15.5 %   Platelets 218.0 150.0 - 400.0 K/uL   Neutrophils Relative % 49.8 43.0 - 77.0 %   Lymphocytes Relative 36.1 12.0 - 46.0 %   Monocytes Relative 8.1 3.0 - 12.0 %   Eosinophils Relative 5.0 0.0 - 5.0 %   Basophils Relative 1.0 0.0 - 3.0 %   Neutro Abs 1.9 1.4 - 7.7 K/uL   Lymphs Abs 1.3 0.7 - 4.0 K/uL   Monocytes Absolute 0.3 0.1 - 1.0 K/uL   Eosinophils Absolute 0.2 0.0 - 0.7 K/uL   Basophils Absolute 0.0 0.0 - 0.1 K/uL  Basic metabolic panel     Status: None   Collection Time: 06/09/17  2:42 PM  Result Value Ref Range   Sodium 141 135 - 145 mEq/L   Potassium 4.2 3.5 - 5.1 mEq/L   Chloride 106 96 - 112 mEq/L   CO2 29 19 - 32 mEq/L   Glucose, Bld 89 70 - 99 mg/dL   BUN 13  6 - 23 mg/dL   Creatinine, Ser 0.92 0.40 - 1.20 mg/dL   Calcium 9.6 8.4 - 10.5 mg/dL   GFR 63.91 >60.00 mL/min  TSH     Status: None   Collection Time: 06/09/17  2:42 PM  Result Value Ref Range   TSH 1.41 0.35 -  4.50 uIU/mL  Hepatic function panel     Status: None   Collection Time: 06/09/17  2:42 PM  Result Value Ref Range   Total Bilirubin 0.5 0.2 - 1.2 mg/dL   Bilirubin, Direct 0.1 0.0 - 0.3 mg/dL   Alkaline Phosphatase 64 39 - 117 U/L   AST 18 0 - 37 U/L   ALT 17 0 - 35 U/L   Total Protein 6.5 6.0 - 8.3 g/dL   Albumin 4.2 3.5 - 5.2 g/dL  CBC with Differential/Platelet     Status: None   Collection Time: 06/09/17  2:42 PM  Result Value Ref Range   WBC 4.7 4.0 - 10.5 K/uL   RBC 4.34 3.87 - 5.11 Mil/uL   Hemoglobin 13.4 12.0 - 15.0 g/dL   HCT 39.0 36.0 - 46.0 %   MCV 89.9 78.0 - 100.0 fl   MCHC 34.3 30.0 - 36.0 g/dL   RDW 13.8 11.5 - 15.5 %   Platelets 199.0 150.0 - 400.0 K/uL   Neutrophils Relative % 59.8 43.0 - 77.0 %   Lymphocytes Relative 29.8 12.0 - 46.0 %   Monocytes Relative 6.7 3.0 - 12.0 %   Eosinophils Relative 2.9 0.0 - 5.0 %   Basophils Relative 0.8 0.0 - 3.0 %   Neutro Abs 2.8 1.4 - 7.7 K/uL   Lymphs Abs 1.4 0.7 - 4.0 K/uL   Monocytes Absolute 0.3 0.1 - 1.0 K/uL   Eosinophils Absolute 0.1 0.0 - 0.7 K/uL   Basophils Absolute 0.0 0.0 - 0.1 K/uL  Stool Culture     Status: None   Collection Time: 06/10/17  2:10 PM  Result Value Ref Range   Organism ID, Bacteria NO SALMONELLA OR SHIGELLA ISOLATED     Comment: NO ENTERIC CAMPYLOBACTER ISOLATED NO ESCHERICHIA COLI 0157 ISOLATED   Clostridium Difficile by PCR     Status: None   Collection Time: 06/10/17  2:10 PM  Result Value Ref Range   Toxigenic C Difficile by pcr Not Detected Not Detected    Comment: This test is for use only with liquid or soft stools; performance characteristics of other clinical specimen types have not been established.   This assay was performed by Cepheid GeneXpert(R) PCR. The  performance characteristics of this assay have been determined by [employer]. Performance characteristics refer to the analytical performance of the test.     Assessment/Plan: 1. Diarrhea, unspecified type 1 day recurrence. Exam unremarkable. Afrebrile. Question true acute gastroenteritis or recurrence of a prior issue -- potential c. Diff. Will check labs and stool studies. Supportive measures and OTC medications reviewed. Patient wanting to hold off on ABX until we get results. Continue probiotic. ER if any worsening symptoms.  - CBC w/Diff - Comp Met (CMET)   Leeanne Rio, PA-C

## 2017-07-23 NOTE — Addendum Note (Signed)
Addended by: Katina Dung on: 07/23/2017 08:11 AM   Modules accepted: Orders

## 2017-07-23 NOTE — Addendum Note (Signed)
Addended by: Katina Dung on: 07/23/2017 08:13 AM   Modules accepted: Orders

## 2017-07-24 LAB — CLOSTRIDIUM DIFFICILE BY PCR: CDIFFPCR: NOT DETECTED

## 2017-07-27 LAB — STOOL CULTURE

## 2017-08-12 ENCOUNTER — Ambulatory Visit (INDEPENDENT_AMBULATORY_CARE_PROVIDER_SITE_OTHER): Payer: Medicare Other | Admitting: Gastroenterology

## 2017-08-12 ENCOUNTER — Encounter: Payer: Self-pay | Admitting: Gastroenterology

## 2017-08-12 VITALS — BP 136/78 | HR 64 | Ht 64.0 in | Wt 149.4 lb

## 2017-08-12 DIAGNOSIS — K219 Gastro-esophageal reflux disease without esophagitis: Secondary | ICD-10-CM | POA: Diagnosis not present

## 2017-08-12 DIAGNOSIS — K580 Irritable bowel syndrome with diarrhea: Secondary | ICD-10-CM | POA: Diagnosis not present

## 2017-08-12 DIAGNOSIS — R1032 Left lower quadrant pain: Secondary | ICD-10-CM

## 2017-08-12 MED ORDER — NA SULFATE-K SULFATE-MG SULF 17.5-3.13-1.6 GM/177ML PO SOLN: 1.0000 | Freq: Once | ORAL | 0 refills | Status: AC

## 2017-08-12 NOTE — Patient Instructions (Addendum)
Ranitidine 150mg  OTC pills, take as needed on those GERD risky nights.  At next LLQ pain, call Dr. Ardis Hughs office. Will likely get labs, CT.  Take imodium OTC as needed for intermittent loose.  You will be set up for a colonoscopy for LLQ pain, clinical diverticulitis.  Normal BMI (Body Mass Index- based on height and weight) is between 23 and 30. Your BMI today is Body mass index is 25.64 kg/m. Marland Kitchen Please consider follow up  regarding your BMI with your Primary Care Provider.

## 2017-08-12 NOTE — Progress Notes (Signed)
Review of pertinent gastrointestinal problems: 1. Routine risk for colon cancer: Colonoscopy Dr. Velora Heckler for "routine screening" done 02/2006 found left sided diverticulosis, hemorrhoids. No polyps. He recommended she have annual hemocult testing (?) and repeat colonoscopy in 8 years (?). Colonoscopy Dr. Velora Heckler 2001 for screening; no polyps were found, recommended repeat colonoscopy in 5-10 years.  Cologuard stool testing 2017 was negative, recall at 3 years. 2. "esopahgeal spasm" led to EGD Dr. Velora Heckler 07/2000: found "tight crico" and early stricture in esophagus, dilated with 31 maloney. EGD 06/2016 DR. Ardis Hughs found benign GE junction stricture, dilated with CRE balloon to 90mm this helped. 3. Clinical diverticiltis; known osis on 2007 colonoscopy.     HPI: This is a very pleasant 71 year old woman who is here with her husband today. I last saw her on the time of an EGD about a year and a half ago.   She had pain in RLQ, fever around 101.  She saw her PCP, cipro/flagyl and her achilles tendons ached and so she was switched to augmentin.   The RLQ pain resovled. Diarrhrea was worse.  Diarrhea gone now for 2 weeks.  Has had ongoing diarrhea for a long time; stool testing culture negative..  3 month ago severe diarrhea, late may. Niece also had it.  After unusual appearing nachos.  Since then, alternating loose stools.  Has felt fine for the the past 2 weeks.  Weight down 5 pounds.  Chief complaint is loose stools, recent clinical diverticulitis  ROS: complete GI ROS as described in HPI, all other review negative.  Constitutional:  No unintentional weight loss   Past Medical History:  Diagnosis Date  . Arthritis   . Asthma   . Cervical disc disease    bulging  . Diverticulitis   . Diverticulosis   . Hyperlipidemia   . Hypertension   . Osteopenia   . Osteoporosis   . Status post dilation of esophageal narrowing     Past Surgical History:  Procedure Laterality Date  .  ABDOMINAL HYSTERECTOMY  1995   TVH--still has ovaries  . ABDOMINAL SACROCOLPOPEXY  2007   Halban culdoplasty & Burch procedure--Dr. Quincy Simmonds  . APPENDECTOMY     Dr. Hassell Done  . arthroscopic shoulder Left    Dr. Durward Fortes, bone spur  . BLADDER SUSPENSION    . CERVICAL FUSION     C-4, C-5 fusion  . CHOLECYSTECTOMY     Dr. Georgina Quint  . ESOPHAGEAL DILATION    . HIP ARTHROPLASTY Left 11/07/2013   Procedure: ARTHROPLASTY BIPOLAR HIP;  Surgeon: Garald Balding, MD;  Location: [facility];  Service: Orthopedics;  Laterality: Left;  . KNEE ARTHROSCOPY Bilateral    Dr. Durward Fortes  . OTHER SURGICAL HISTORY  2011   posterior Colporrhaphy with Xenform biological graft--Dr. Quincy Simmonds  . RECTOCELE REPAIR  2011   posterior colporrhaphy with Xenform biological graft--Dr. Quincy Simmonds    Current Outpatient Prescriptions  Medication Sig Dispense Refill  . atorvastatin (LIPITOR) 10 MG tablet Take 1 tablet by mouth daily.  3  . azelastine (ASTELIN) 0.1 % nasal spray U 1 SPR IEN BID PRN  5  . beta carotene w/minerals (OCUVITE) tablet Take 1 tablet by mouth daily.    . Calcium Citrate (CITRACAL PO) Take 2 tablets by mouth daily.    . cetirizine (ZYRTEC) 10 MG tablet Take 1 tablet (10 mg total) by mouth daily. 30 tablet 11  . clobetasol ointment (TEMOVATE) 0.05 % Apply topically 2 (two) times daily as needed. 30 g 0  .  Coenzyme Q10 (COQ10) 200 MG CAPS Take 1 tablet by mouth daily.    Marland Kitchen EPIPEN 2-PAK 0.3 MG/0.3ML SOAJ injection Inject 1 mg into the muscle as needed. Reported on 06/28/2016    . Ergocalciferol (VITAMIN D2) 400 units TABS Take 1 capsule by mouth daily.    Marland Kitchen esomeprazole (NEXIUM) 20 MG capsule Take 20 mg by mouth daily at 12 noon.    . fish oil-omega-3 fatty acids 1000 MG capsule Take 2 g by mouth daily.    Marland Kitchen lactobacillus acidophilus (BACID) TABS tablet Take 2 tablets by mouth 2 (two) times a week.    . losartan (COZAAR) 100 MG tablet Take 1 tablet (100 mg total) by mouth daily. 90 tablet 1  . montelukast  (SINGULAIR) 10 MG tablet Take 1 tablet (10 mg total) by mouth at bedtime. 30 tablet 3  . Multiple Vitamin (MULTIVITAMIN) capsule Take 1 capsule by mouth daily.    . Olopatadine HCl (PATANASE) 0.6 % SOLN Place 1 puff into the nose as needed. Reported on 06/27/2016    . PROAIR HFA 108 (90 Base) MCG/ACT inhaler INL 2 PFS PO Q 4 TO 6 H PRN  0  . Vitamin Mixture (ESTER-C) 500-60 MG TABS Take 1 capsule by mouth daily.     No current facility-administered medications for this visit.     Allergies as of 08/12/2017 - Review Complete 08/12/2017  Allergen Reaction Noted  . Evista [raloxifene]  10/21/2013  . Fosamax [alendronate] Other (See Comments) 09/13/2015    Family History  Problem Relation Age of Onset  . Hypertension Mother   . Diabetes Father   . Hypertension Father   . Heart disease Father   . Diabetes Sister   . Hypertension Sister   . Thyroid disease Sister   . Heart disease Sister   . Throat cancer Paternal Uncle        smoker  . Lung cancer Cousin        smoker  . Colon cancer Neg Hx   . Pancreatic cancer Neg Hx   . Stomach cancer Neg Hx   . Liver disease Neg Hx     Social History   Social History  . Marital status: Married    Spouse name: N/A  . Number of children: 1  . Years of education: N/A   Occupational History  . retired    Social History Main Topics  . Smoking status: Never Smoker  . Smokeless tobacco: Never Used  . Alcohol use 0.0 oz/week     Comment: 7 glasses of wine per week  . Drug use: No  . Sexual activity: Yes    Partners: Male    Birth control/ protection: Other-see comments, Post-menopausal     Comment: TVH--Still has ovaries   Other Topics Concern  . Not on file   Social History Narrative  . No narrative on file     Physical Exam: BP 136/78   Pulse 64   Ht 5\' 4"  (1.626 m)   Wt 149 lb 6.4 oz (67.8 kg)   LMP 12/16/1993 (Approximate)   BMI 25.64 kg/m  Constitutional: generally well-appearing Psychiatric: alert and oriented  x3 Abdomen: soft, nontender, nondistended, no obvious ascites, no peritoneal signs, normal bowel sounds No peripheral edema noted in lower extremities  Assessment and plan: 71 y.o. female with Loose stools since acute GI illness 3 months ago, recent clinical diverticulitis  First I do think she is bothered by post infectious irritable bowel syndrome, diarrhea predominant. She had significant  diarrheal illness for 5 months ago while traveling. Her niece had the same symptoms after eating the same suspicious appearing nachos.  Imodium helps when she uses it in fact causes constipation for about 3 days. Stool testing has been repeated twice and has been negative. I reassured her this is unlikely any serious and that she should take an Imodium and a half of an Imodium on an as-needed basis if it returns. Fortunately she has not had any trouble about 2 weeks. Not sure if she is really having diverticulitis since it has never been proven by imaging. The next time she has left lower quadrant pains she will call here. I did recommend colonoscopy for since her last one was about 11 years ago. Colo guard was negative last year but with this suspicion of clinical diverticulitis, pains in the left lower quadrant I recommended repeating that now with colonoscopy. Lastly she brought up some GERD symptoms especially at night when she takes a bit of wine or has chocolate. I recommended over-the-counter H2 blocker on an as-needed basis when she goes to bed.  Please see the "Patient Instructions" section for addition details about the plan.  Owens Loffler, MD [facility] 08/12/2017, 9:17 AM

## 2017-09-24 ENCOUNTER — Encounter: Payer: Self-pay | Admitting: Gastroenterology

## 2017-10-01 ENCOUNTER — Encounter: Payer: Self-pay | Admitting: Obstetrics and Gynecology

## 2017-10-01 ENCOUNTER — Ambulatory Visit (INDEPENDENT_AMBULATORY_CARE_PROVIDER_SITE_OTHER): Payer: Medicare Other | Admitting: Obstetrics and Gynecology

## 2017-10-01 VITALS — BP 126/72 | HR 64 | Resp 16 | Ht 63.25 in | Wt 149.0 lb

## 2017-10-01 DIAGNOSIS — Z01419 Encounter for gynecological examination (general) (routine) without abnormal findings: Secondary | ICD-10-CM

## 2017-10-01 NOTE — Progress Notes (Signed)
71 y.o. G24P1011 Married Caucasian female here for annual exam.    Good bladder control.   Flare of lichen sclerosus. Has clobetasol and does not need refill.  She declines tx for her osteoporosis.  She discussed osteoporosis with Dr. Corena Pilgrim.   ROS - muscle and joint pain, sinusitis, diarrhea (doing colonoscopy for this reason).  SOC - [facility] at [facility]   PCP:  Dr. Annye Asa   Patient's last menstrual period was 12/16/1993 (approximate).           Sexually active: Yes.    The current method of family planning is status post hysterectomy.    Exercising: Yes.    walking and exercise bike Smoker:  no  Health Maintenance: Pap:  06/03/11- Negative History of abnormal Pap:  no MMG:  12/02/16 BIRADS 1 negative/density c Colonoscopy:  Scheduled for 10/08/17 - Dr. Ardis Hughs BMD:   09/10/15    Result  Osteoporosis.  Left hip -2.8. TDaP:  Due for immunization -- will get w/ PCP HIV: never Hep C: 05/06/17 Negative Screening Labs:  PCP    reports that she has never smoked. She has never used smokeless tobacco. She reports that she drinks alcohol. She reports that she does not use drugs.  Past Medical History:  Diagnosis Date  . Arthritis   . Asthma   . Cervical disc disease    bulging  . Diverticulitis   . Diverticulosis   . Hyperlipidemia   . Hypertension   . Osteopenia   . Osteoporosis   . Status post dilation of esophageal narrowing     Past Surgical History:  Procedure Laterality Date  . ABDOMINAL HYSTERECTOMY  1995   TVH--still has ovaries  . ABDOMINAL SACROCOLPOPEXY  2007   Halban culdoplasty & Burch procedure--Dr. Quincy Simmonds  . APPENDECTOMY     Dr. Hassell Done  . arthroscopic shoulder Left    Dr. Durward Fortes, bone spur  . BLADDER SUSPENSION    . CERVICAL FUSION     C-4, C-5 fusion  . CHOLECYSTECTOMY     Dr. Georgina Quint  . ESOPHAGEAL DILATION    . HIP ARTHROPLASTY Left 11/07/2013   Procedure: ARTHROPLASTY BIPOLAR HIP;  Surgeon: Garald Balding, MD;   Location: [facility];  Service: Orthopedics;  Laterality: Left;  . KNEE ARTHROSCOPY Bilateral    Dr. Durward Fortes  . OTHER SURGICAL HISTORY  2011   posterior Colporrhaphy with Xenform biological graft--Dr. Quincy Simmonds  . RECTOCELE REPAIR  2011   posterior colporrhaphy with Xenform biological graft--Dr. Quincy Simmonds    Current Outpatient Prescriptions  Medication Sig Dispense Refill  . atorvastatin (LIPITOR) 10 MG tablet Take 1 tablet by mouth daily.  3  . azelastine (ASTELIN) 0.1 % nasal spray U 1 SPR IEN BID PRN  5  . beta carotene w/minerals (OCUVITE) tablet Take 1 tablet by mouth daily.    . Calcium Citrate (CITRACAL PO) Take 2 tablets by mouth daily.    . cetirizine (ZYRTEC) 10 MG tablet Take 1 tablet (10 mg total) by mouth daily. 30 tablet 11  . clobetasol ointment (TEMOVATE) 0.05 % Apply topically 2 (two) times daily as needed. 30 g 0  . Coenzyme Q10 (COQ10) 200 MG CAPS Take 1 tablet by mouth daily.    Marland Kitchen EPIPEN 2-PAK 0.3 MG/0.3ML SOAJ injection Inject 1 mg into the muscle as needed. Reported on 06/28/2016    . Ergocalciferol (VITAMIN D2) 400 units TABS Take 1 capsule by mouth daily.    Marland Kitchen esomeprazole (NEXIUM) 20 MG capsule Take  20 mg by mouth daily at 12 noon.    . fish oil-omega-3 fatty acids 1000 MG capsule Take 2 g by mouth daily.    Marland Kitchen lactobacillus acidophilus (BACID) TABS tablet Take 2 tablets by mouth 2 (two) times a week.    . losartan (COZAAR) 100 MG tablet Take 1 tablet (100 mg total) by mouth daily. 90 tablet 1  . montelukast (SINGULAIR) 10 MG tablet Take 1 tablet (10 mg total) by mouth at bedtime. 30 tablet 3  . Multiple Vitamin (MULTIVITAMIN) capsule Take 1 capsule by mouth daily.    . Olopatadine HCl (PATANASE) 0.6 % SOLN Place 1 puff into the nose as needed. Reported on 06/27/2016    . PROAIR HFA 108 (90 Base) MCG/ACT inhaler INL 2 PFS PO Q 4 TO 6 H PRN  0  . Vitamin Mixture (ESTER-C) 500-60 MG TABS Take 1 capsule by mouth daily.     No current facility-administered medications for this  visit.     Family History  Problem Relation Age of Onset  . Hypertension Mother   . Diabetes Father   . Hypertension Father   . Heart disease Father   . Diabetes Sister   . Hypertension Sister   . Thyroid disease Sister   . Heart disease Sister   . Throat cancer Paternal Uncle        smoker  . Lung cancer Cousin        smoker  . Colon cancer Neg Hx   . Pancreatic cancer Neg Hx   . Stomach cancer Neg Hx   . Liver disease Neg Hx     ROS:  Pertinent items are noted in HPI.  Otherwise, a comprehensive ROS was negative.  Exam:   BP 126/72 (BP Location: Right Arm, Patient Position: Sitting, Cuff Size: Normal)   Pulse 64   Resp 16   Ht 5' 3.25" (1.607 m)   Wt 149 lb (67.6 kg)   LMP 12/16/1993 (Approximate)   BMI 26.19 kg/m     General appearance: alert, cooperative and appears stated age.  Wearing cervical spine collar. Head: Normocephalic, without obvious abnormality, atraumatic Neck: no adenopathy, supple, symmetrical, trachea midline and thyroid normal to inspection and palpation Lungs: clear to auscultation bilaterally Breasts: normal appearance, no masses or tenderness, No nipple retraction or dimpling, No nipple discharge or bleeding, No axillary or supraclavicular adenopathy Heart: regular rate and rhythm Abdomen: soft, non-tender; no masses, no organomegaly Extremities: extremities normal, atraumatic, no cyanosis or edema Skin: Skin color, texture, turgor normal. No rashes or lesions Lymph nodes: Cervical, supraclavicular, and axillary nodes normal. No abnormal inguinal nodes palpated Neurologic: Grossly normal  Pelvic: External genitalia:  Scar of the perineum and 2 mm red area of the left labia minora.              Urethra:  Urethral prolapse, with no masses, tenderness or lesions              Bartholins and Skenes: normal                 Vagina: normal appearing vagina with normal color and discharge, no lesions.  First degree cystocele.  Good vaginal vault and  posterior vaginal wall support.               Cervix:  Absent.              Pap taken: No. Bimanual Exam:  Uterus:   Absent.  Adnexa: no mass, fullness, tenderness              Rectal exam:  Deferred to upcoming colonoscopy.   Chaperone was present for exam.  Assessment:   Well woman visit with normal exam. Status post hysterectomy.  Status post sacrocolpopexy.  Small cystocele.  Status post rectocele repair with biologic graft.  Osteoporosis. Has declined tx.  Lichen sclerosus. Controlled with Clobetasol.  Plan: Mammogram in December.  Recommended self breast awareness. Pap and HR HPV as above. Guidelines for Calcium, Vitamin D, regular exercise program including cardiovascular and weight bearing exercise. She will let me know if she develops any difficulty with voiding or controlling urination.  We talked about ways to reduce risk of falls.  She will let me know if she decides to do a future bone density.  Labs with PCP.  Colonoscopy next week.  Follow up annually and prn.   After visit summary provided.

## 2017-10-01 NOTE — Patient Instructions (Signed)

## 2017-10-08 ENCOUNTER — Ambulatory Visit (AMBULATORY_SURGERY_CENTER): Payer: Medicare Other | Admitting: Gastroenterology

## 2017-10-08 ENCOUNTER — Encounter: Payer: Self-pay | Admitting: Gastroenterology

## 2017-10-08 VITALS — BP 127/68 | HR 47 | Temp 98.4°F | Resp 19 | Ht 64.0 in | Wt 149.0 lb

## 2017-10-08 DIAGNOSIS — R1032 Left lower quadrant pain: Secondary | ICD-10-CM

## 2017-10-08 DIAGNOSIS — K5730 Diverticulosis of large intestine without perforation or abscess without bleeding: Secondary | ICD-10-CM | POA: Diagnosis not present

## 2017-10-08 DIAGNOSIS — Z1212 Encounter for screening for malignant neoplasm of rectum: Secondary | ICD-10-CM

## 2017-10-08 DIAGNOSIS — D123 Benign neoplasm of transverse colon: Secondary | ICD-10-CM | POA: Diagnosis not present

## 2017-10-08 DIAGNOSIS — Z1211 Encounter for screening for malignant neoplasm of colon: Secondary | ICD-10-CM

## 2017-10-08 MED ORDER — SODIUM CHLORIDE 0.9 % IV SOLN: 500.0000 mL | INTRAVENOUS | Status: DC

## 2017-10-08 NOTE — Op Note (Signed)
[facility] Patient Name: Sarah [name] Procedure Date: 10/08/2017 8:58 AM MRN: [id number] Endoscopist: Milus Banister , MD Age: 71 Referring MD:  Date of Birth: 1946/02/11 Gender: Female Account #: [id number] Procedure:                Colonoscopy Indications:              Screening for colorectal malignant neoplasm Medicines:                Monitored Anesthesia Care Procedure:                Pre-Anesthesia Assessment:                           - Prior to the procedure, a History and Physical                            was performed, and patient medications and                            allergies were reviewed. The patient's tolerance of                            previous anesthesia was also reviewed. The risks                            and benefits of the procedure and the sedation                            options and risks were discussed with the patient.                            All questions were answered, and informed consent                            was obtained. Prior Anticoagulants: The patient has                            taken no previous anticoagulant or antiplatelet                            agents. ASA Grade Assessment: II - A patient with                            mild systemic disease. After reviewing the risks                            and benefits, the patient was deemed in                            satisfactory condition to undergo the procedure.                           After obtaining informed consent, the colonoscope  was passed under direct vision. Throughout the                            procedure, the patient's blood pressure, pulse, and                            oxygen saturations were monitored continuously. The                            Colonoscope was introduced through the anus and                            advanced to the the cecum, identified by                            appendiceal orifice and  ileocecal valve. The                            colonoscopy was performed without difficulty. The                            patient tolerated the procedure well. The quality                            of the bowel preparation was good. The ileocecal                            valve, appendiceal orifice, and rectum were                            photographed. Scope In: 9:08:11 AM Scope Out: 9:18:29 AM Scope Withdrawal Time: 0 hours 7 minutes 15 seconds  Total Procedure Duration: 0 hours 10 minutes 18 seconds  Findings:                 A 4 mm polyp was found in the transverse colon. The                            polyp was sessile. The polyp was removed with a                            cold snare. Resection and retrieval were complete.                           Multiple small and large-mouthed diverticula were                            found in the left colon.                           The exam was otherwise without abnormality on                            direct and retroflexion views. Complications:  No immediate complications. Estimated blood loss:                            None. Estimated Blood Loss:     Estimated blood loss: none. Impression:               - One 4 mm polyp in the transverse colon, removed                            with a cold snare. Resected and retrieved.                           - Diverticulosis in the left colon.                           - The examination was otherwise normal on direct                            and retroflexion views. Recommendation:           - Patient has a contact number available for                            emergencies. The signs and symptoms of potential                            delayed complications were discussed with the                            patient. Return to normal activities tomorrow.                            Written discharge instructions were provided to the                            patient.                            - Resume previous diet.                           - Continue present medications.                           You will receive a letter within 2-3 weeks with the                            pathology results and my final recommendations.                           If the polyp(s) is proven to be 'pre-cancerous' on                            pathology, you will need repeat colonoscopy in 5  years. Milus Banister, MD 10/08/2017 9:20:49 AM This report has been signed electronically.

## 2017-10-08 NOTE — Progress Notes (Signed)
No egg or soy allergy known to patient   issues with past sedation with any surgeries  or procedures OF ponv , no intubation problems  No diet pills per patient No home 02 use per patient  No blood thinners per patient  Pt denies issues with constipation  No A fib or A flutter  PT IS WEARING A NECK BRACE AS SHE HAS HAD CERVICAL FUSION SURGERY AND ABOVE THIS AREA SHE HAS BONE SPURS IN HER NECK

## 2017-10-08 NOTE — Patient Instructions (Signed)
**   Handouts given on polyps and diverticulosis **   YOU HAD AN ENDOSCOPIC PROCEDURE TODAY AT THE [facility]:   Refer to the procedure report that was given to you for any specific questions about what was found during the examination.  If the procedure report does not answer your questions, please call your gastroenterologist to clarify.  If you requested that your care partner not be given the details of your procedure findings, then the procedure report has been included in a sealed envelope for you to review at your convenience later.  YOU SHOULD EXPECT: Some feelings of bloating in the abdomen. Passage of more gas than usual.  Walking can help get rid of the air that was put into your GI tract during the procedure and reduce the bloating. If you had a lower endoscopy (such as a colonoscopy or flexible sigmoidoscopy) you may notice spotting of blood in your stool or on the toilet paper. If you underwent a bowel prep for your procedure, you may not have a normal bowel movement for a few days.  Please Note:  You might notice some irritation and congestion in your nose or some drainage.  This is from the oxygen used during your procedure.  There is no need for concern and it should clear up in a day or so.  SYMPTOMS TO REPORT IMMEDIATELY:   Following lower endoscopy (colonoscopy or flexible sigmoidoscopy):  Excessive amounts of blood in the stool  Significant tenderness or worsening of abdominal pains  Swelling of the abdomen that is new, acute  Fever of 100F or higher  For urgent or emergent issues, a gastroenterologist can be reached at any hour by calling [phone].   DIET:  We do recommend a small meal at first, but then you may proceed to your regular diet.  Drink plenty of fluids but you should avoid alcoholic beverages for 24 hours.  ACTIVITY:  You should plan to take it easy for the rest of today and you should NOT DRIVE or use heavy machinery until tomorrow (because  of the sedation medicines used during the test).    FOLLOW UP: Our staff will call the number listed on your records the next business day following your procedure to check on you and address any questions or concerns that you may have regarding the information given to you following your procedure. If we do not reach you, we will leave a message.  However, if you are feeling well and you are not experiencing any problems, there is no need to return our call.  We will assume that you have returned to your regular daily activities without incident.  If any biopsies were taken you will be contacted by phone or by letter within the next 1-3 weeks.  Please call us at [phone] if you have not heard about the biopsies in 3 weeks.    SIGNATURES/CONFIDENTIALITY: You and/or your care partner have signed paperwork which will be entered into your electronic medical record.  These signatures attest to the fact that that the information above on your After Visit Summary has been reviewed and is understood.  Full responsibility of the confidentiality of this discharge information lies with you and/or your care-partner. 

## 2017-10-08 NOTE — Progress Notes (Signed)
A and O x3. Report to RN. Tolerated MAC anesthesia well.

## 2017-10-08 NOTE — Progress Notes (Signed)
Called to room to assist during endoscopic procedure.  Patient ID and intended procedure confirmed with present staff. Received instructions for my participation in the procedure from the performing physician.  

## 2017-10-09 ENCOUNTER — Telehealth: Payer: Self-pay | Admitting: *Deleted

## 2017-10-09 NOTE — Telephone Encounter (Signed)
  Follow up Call-  Call back number 10/08/2017 06/28/2016  Post procedure Call Back phone  # [phone]  Permission to leave phone message Yes Yes  Some recent data might be hidden     Patient questions:  Do you have a fever, pain , or abdominal swelling? No. Pain Score  0 *  Have you tolerated food without any problems? Yes.    Have you been able to return to your normal activities? Yes.    Do you have any questions about your discharge instructions: Diet   No. Medications  No. Follow up visit  No.  Do you have questions or concerns about your Care? No.  Actions: * If pain score is 4 or above: No action needed, pain <4.

## 2017-10-16 ENCOUNTER — Encounter: Payer: Self-pay | Admitting: Gastroenterology

## 2017-10-30 ENCOUNTER — Other Ambulatory Visit: Payer: Self-pay | Admitting: Obstetrics and Gynecology

## 2017-10-30 DIAGNOSIS — Z1231 Encounter for screening mammogram for malignant neoplasm of breast: Secondary | ICD-10-CM

## 2017-11-11 NOTE — Progress Notes (Signed)
Subjective:   Sarah [name] is a 71 y.o. female who presents for Medicare Annual (Subsequent) preventive examination.  Review of Systems:  No ROS.  Medicare Wellness Visit. Additional risk factors are reflected in the social history.  Cardiac Risk Factors include: advanced age (>92men, >47 women);family history of premature cardiovascular disease   Sleep patterns: Sleeps 6 hours, feels rested.  Home Safety/Smoke Alarms: Feels safe in home. Smoke alarms in place.  Living environment; residence and Firearm Safety: Lives with husband in 1 story home.  Seat Belt Safety/Bike Helmet: Wears seat belt.   Female:   [id number]       Mammo-12/02/2016, negative. Scheduled for 12/22/2017 ([facility])    Dexa scan-09/11/2015, Osteoporosis. Declines testing, discussed with GYN.        CCS-Colonoscopy 10/08/2017, polyp. Recall 5 years.      Objective:     Vitals: BP 138/80 (BP Location: Left Arm, Patient Position: Sitting, Cuff Size: Normal)   Pulse 71   Temp 98.2 F (36.8 C) (Temporal)   Resp 18   Ht 5\' 4"  (1.626 m)   Wt 150 lb (68 kg)   LMP 12/16/1993 (Approximate)   SpO2 97%   BMI 25.75 kg/m   Body mass index is 25.75 kg/m.   Tobacco Social History   Tobacco Use  Smoking Status Never Smoker  Smokeless Tobacco Never Used     Counseling given: Not Answered   Past Medical History:  Diagnosis Date  . Allergy   . Arthritis   . Asthma   . Cataract    left eye and stable   . Cervical disc disease    bulging  . Diverticulitis   . Diverticulosis   . GERD (gastroesophageal reflux disease)   . Hyperlipidemia   . Hypertension   . Osteopenia   . Osteoporosis   . PONV (postoperative nausea and vomiting)   . Status post dilation of esophageal narrowing    Past Surgical History:  Procedure Laterality Date  . ABDOMINAL HYSTERECTOMY  1995   TVH--still has ovaries  . ABDOMINAL SACROCOLPOPEXY  2007   Halban culdoplasty & Burch procedure--Dr. Quincy Simmonds  . APPENDECTOMY     Dr.  Hassell Done  . arthroscopic shoulder Left    Dr. Durward Fortes, bone spur  . BLADDER SUSPENSION    . CERVICAL FUSION     C-4, C-5 fusion  . CHOLECYSTECTOMY     Dr. Georgina Quint  . ESOPHAGEAL DILATION    . HIP ARTHROPLASTY Left 11/07/2013   Procedure: ARTHROPLASTY BIPOLAR HIP;  Surgeon: Garald Balding, MD;  Location: [place];  Service: Orthopedics;  Laterality: Left;  . KNEE ARTHROSCOPY Bilateral    Dr. Durward Fortes  . OTHER SURGICAL HISTORY  2011   posterior Colporrhaphy with Xenform biological graft--Dr. Quincy Simmonds  . RECTOCELE REPAIR  2011   posterior colporrhaphy with Xenform biological graft--Dr. Quincy Simmonds  . UPPER GASTROINTESTINAL ENDOSCOPY     Family History  Problem Relation Age of Onset  . Hypertension Mother   . Diabetes Father   . Hypertension Father   . Heart disease Father   . Diabetes Sister   . Hypertension Sister   . Thyroid disease Sister   . Heart disease Sister   . Throat cancer Paternal Uncle        smoker  . Lung cancer Cousin        smoker  . Colon cancer Neg Hx   . Pancreatic cancer Neg Hx   . Stomach cancer Neg Hx   . Liver  disease Neg Hx   . Colon polyps Neg Hx   . Esophageal cancer Neg Hx   . Rectal cancer Neg Hx    Social History   Substance and Sexual Activity  Sexual Activity Yes  . Partners: Male  . Birth control/protection: Other-see comments, Post-menopausal   Comment: TVH--Still has ovaries    Outpatient Encounter Medications as of 11/12/2017  Medication Sig  . Ascorbic Acid (VITAMIN C PO) Take by mouth.  Marland Kitchen atorvastatin (LIPITOR) 10 MG tablet Take 1 tablet by mouth daily.  Marland Kitchen azelastine (ASTELIN) 0.1 % nasal spray U 1 SPR IEN BID PRN  . beta carotene w/minerals (OCUVITE) tablet Take 1 tablet by mouth daily.  . Calcium Citrate (CITRACAL PO) Take 2 tablets by mouth daily.  . cetirizine (ZYRTEC) 10 MG tablet Take 1 tablet (10 mg total) by mouth daily.  . clobetasol ointment (TEMOVATE) 0.05 % Apply topically 2 (two) times daily as needed.  .  Coenzyme Q10 (COQ10) 200 MG CAPS Take 1 tablet by mouth daily.  Marland Kitchen EPIPEN 2-PAK 0.3 MG/0.3ML SOAJ injection Inject 1 mg into the muscle as needed. Reported on 06/28/2016  . Ergocalciferol (VITAMIN D2) 400 units TABS Take 1 capsule by mouth daily.  Marland Kitchen esomeprazole (NEXIUM) 20 MG capsule Take 20 mg by mouth daily at 12 noon.  . fish oil-omega-3 fatty acids 1000 MG capsule Take 2 g by mouth daily.  Marland Kitchen lactobacillus acidophilus (BACID) TABS tablet Take 2 tablets by mouth 2 (two) times a week.  . losartan (COZAAR) 100 MG tablet Take 1 tablet (100 mg total) by mouth daily.  . montelukast (SINGULAIR) 10 MG tablet Take 1 tablet (10 mg total) by mouth at bedtime.  . Multiple Vitamin (MULTIVITAMIN) capsule Take 1 capsule by mouth daily.  . Olopatadine HCl (PATANASE) 0.6 % SOLN Place 1 puff into the nose as needed. Reported on 06/27/2016  . PROAIR HFA 108 (90 Base) MCG/ACT inhaler INL 2 PFS PO Q 4 TO 6 H PRN  . [DISCONTINUED] Vitamin Mixture (ESTER-C) 500-60 MG TABS Take 1 capsule by mouth daily.   No facility-administered encounter medications on file as of 11/12/2017.     Activities of Daily Living In your present state of health, do you have any difficulty performing the following activities: 11/12/2017 01/27/2017  Hearing? N N  Vision? N N  Difficulty concentrating or making decisions? N N  Walking or climbing stairs? N N  Dressing or bathing? N N  Doing errands, shopping? N N  Preparing Food and eating ? N -  Using the Toilet? N -  In the past six months, have you accidently leaked urine? N -  Do you have problems with loss of bowel control? N -  Managing your Medications? N -  Managing your Finances? N -  Housekeeping or managing your Housekeeping? N -  Some recent data might be hidden    Patient Care Team: Midge Minium, MD as PCP - General (Family Medicine) Milus Banister, MD as Attending Physician (Gastroenterology) Harold Hedge, Darrick Grinder, MD as Consulting Physician (Allergy and  Immunology) Nunzio Cobbs, MD as Consulting Physician (Obstetrics and Gynecology) Consuella Lose, MD as Consulting Physician (Neurosurgery) Otelia Sergeant, OD as Consulting Physician Jamesetta Geralds, [facility], Skin Surgery    Assessment:    Physical assessment deferred to PCP.  Exercise Activities and Dietary recommendations Current Exercise Habits: Home exercise routine, Type of exercise: walking(stationary bike), Time (Minutes): 30, Frequency (Times/Week): 4, Weekly Exercise (Minutes/Week):  120, Exercise limited by: None identified   Diet (meal preparation, eat out, water intake, caffeinated beverages, dairy products, fruits and vegetables): Drinks teas, bai water and water.   Eats healthy diet majority of time.       Goals    . Weight (lb) < 140 lb (63.5 kg)     Follow clean eating diet and walking.       Fall Risk Fall Risk  11/12/2017 01/27/2017 11/04/2016 06/03/2016  Falls in the past year? No No No No   Depression Screen PHQ 2/9 Scores 11/12/2017 07/22/2017 05/06/2017 01/27/2017  PHQ - 2 Score 0 0 0 0  PHQ- 9 Score - 0 0 0     Cognitive Function MMSE - Mini Mental State Exam 11/12/2017  Orientation to time 5  Orientation to Place 5  Registration 3  Attention/ Calculation 5  Recall 3  Language- name 2 objects 2  Language- repeat 1  Language- follow 3 step command 3  Language- read & follow direction 1  Write a sentence 1  Copy design 1  Total score 30        Immunization History  Administered Date(s) Administered  . Influenza, High Dose Seasonal PF 09/02/2016  . Influenza,inj,Quad PF,6+ Mos 09/05/2015  . Influenza-Unspecified 08/15/2017  . Pneumococcal Conjugate-13 05/22/2015  . Pneumococcal Polysaccharide-23 11/04/2016  . Zoster Recombinat (Shingrix) 03/24/2017, 07/15/2017   Screening Tests Health Maintenance  Topic Date Due  . TETANUS/TDAP  02/22/1965  . MAMMOGRAM  12/02/2018  . Fecal DNA (Cologuard)   08/05/2019  . INFLUENZA VACCINE  Completed  . DEXA SCAN  Completed  . Hepatitis C Screening  Completed  . PNA vac Low Risk Adult  Completed      Plan:    Bring a copy of your living will and/or healthcare power of attorney to your next office visit.  Continue doing brain stimulating activities (puzzles, reading, adult coloring books, staying active) to keep memory sharp.   I have personally reviewed and noted the following in the patient's chart:   . Medical and social history . Use of alcohol, tobacco or illicit drugs  . Current medications and supplements . Functional ability and status . Nutritional status . Physical activity . Advanced directives . List of other physicians . Hospitalizations, surgeries, and ER visits in previous 12 months . Vitals . Screenings to include cognitive, depression, and falls . Referrals and appointments  In addition, I have reviewed and discussed with patient certain preventive protocols, quality metrics, and best practice recommendations. A written personalized care plan for preventive services as well as general preventive health recommendations were provided to patient.     Gerilyn Nestle, RN  11/12/2017  Reviewed documentation provided by RN and agree w/ above.  Annye Asa, MD

## 2017-11-12 ENCOUNTER — Other Ambulatory Visit: Payer: Self-pay

## 2017-11-12 ENCOUNTER — Encounter: Payer: Self-pay | Admitting: Family Medicine

## 2017-11-12 ENCOUNTER — Ambulatory Visit (INDEPENDENT_AMBULATORY_CARE_PROVIDER_SITE_OTHER): Payer: Medicare Other | Admitting: Family Medicine

## 2017-11-12 ENCOUNTER — Ambulatory Visit: Payer: Medicare Other

## 2017-11-12 VITALS — BP 138/80 | HR 71 | Temp 98.2°F | Resp 18 | Ht 64.0 in | Wt 150.0 lb

## 2017-11-12 DIAGNOSIS — M810 Age-related osteoporosis without current pathological fracture: Secondary | ICD-10-CM

## 2017-11-12 DIAGNOSIS — E785 Hyperlipidemia, unspecified: Secondary | ICD-10-CM

## 2017-11-12 DIAGNOSIS — I10 Essential (primary) hypertension: Secondary | ICD-10-CM

## 2017-11-12 DIAGNOSIS — Z0000 Encounter for general adult medical examination without abnormal findings: Secondary | ICD-10-CM | POA: Diagnosis not present

## 2017-11-12 LAB — BASIC METABOLIC PANEL
BUN: 15 mg/dL (ref 6–23)
CALCIUM: 9.8 mg/dL (ref 8.4–10.5)
CO2: 26 mEq/L (ref 19–32)
CREATININE: 0.94 mg/dL (ref 0.40–1.20)
Chloride: 105 mEq/L (ref 96–112)
GFR: 62.26 mL/min (ref >60.00)
GLUCOSE: 87 mg/dL (ref 70–99)
POTASSIUM: 4.4 meq/L (ref 3.5–5.1)
Sodium: 143 mEq/L (ref 135–145)

## 2017-11-12 LAB — HEPATIC FUNCTION PANEL
ALBUMIN: 4.5 g/dL (ref 3.5–5.2)
ALK PHOS: 68 U/L (ref 39–117)
ALT: 19 U/L (ref 0–35)
AST: 22 U/L (ref 0–37)
Bilirubin, Direct: 0.1 mg/dL (ref 0.0–0.3)
TOTAL PROTEIN: 7 g/dL (ref 6.0–8.3)
Total Bilirubin: 0.6 mg/dL (ref 0.2–1.2)

## 2017-11-12 LAB — CBC WITH DIFFERENTIAL/PLATELET
BASOS ABS: 0 10*3/uL (ref 0.0–0.1)
Basophils Relative: 0.8 % (ref 0.0–3.0)
EOS PCT: 4 % (ref 0.0–5.0)
Eosinophils Absolute: 0.2 10*3/uL (ref 0.0–0.7)
HCT: 42 % (ref 36.0–46.0)
HEMOGLOBIN: 14.3 g/dL (ref 12.0–15.0)
LYMPHS ABS: 1.4 10*3/uL (ref 0.7–4.0)
Lymphocytes Relative: 35.2 % (ref 12.0–46.0)
MCHC: 34 g/dL (ref 30.0–36.0)
MCV: 91.4 fl (ref 78.0–100.0)
MONOS PCT: 7.5 % (ref 3.0–12.0)
Monocytes Absolute: 0.3 10*3/uL (ref 0.1–1.0)
NEUTROS PCT: 52.5 % (ref 43.0–77.0)
Neutro Abs: 2.1 10*3/uL (ref 1.4–7.7)
Platelets: 204 10*3/uL (ref 150.0–400.0)
RBC: 4.59 Mil/uL (ref 3.87–5.11)
RDW: 13.9 % (ref 11.5–15.5)
WBC: 4 10*3/uL (ref 4.0–10.5)

## 2017-11-12 LAB — LIPID PANEL
CHOL/HDL RATIO: 3
CHOLESTEROL: 205 mg/dL — AB (ref 0–200)
HDL: 63.8 mg/dL (ref >39.00)
LDL CALC: 111 mg/dL — AB (ref 0–99)
NonHDL: 141.36
TRIGLYCERIDES: 153 mg/dL — AB (ref 0.0–149.0)
VLDL: 30.6 mg/dL (ref 0.0–40.0)

## 2017-11-12 LAB — VITAMIN D 25 HYDROXY (VIT D DEFICIENCY, FRACTURES): VITD: 72.77 ng/mL (ref 30.00–100.00)

## 2017-11-12 LAB — TSH: TSH: 1.33 u[IU]/mL (ref 0.35–4.50)

## 2017-11-12 NOTE — Assessment & Plan Note (Signed)
Chronic problem.  Tolerating statin w/o difficulty.  Check labs.  Adjust meds prn  

## 2017-11-12 NOTE — Assessment & Plan Note (Signed)
Pt's PE WNL.  UTD on colonoscopy, has mammo scheduled for January.  UTD on immunizations.  Check labs.  Anticipatory guidance provided.

## 2017-11-12 NOTE — Assessment & Plan Note (Signed)
Chronic problem.  Well controlled today.  Asymptomatic.  Check labs.  No anticipated med changes.  Will follow. 

## 2017-11-12 NOTE — Assessment & Plan Note (Signed)
Chronic problem.  Currently being tx'd by GYN.  Check Vit D and replete prn.

## 2017-11-12 NOTE — Progress Notes (Signed)
   Subjective:    Patient ID: Sarah [name], female    DOB: 11/16/1946, 71 y.o.   MRN: [id number]  HPI CPE- UTD on colonoscopy, has mammo scheduled.  UTD on immunizations.  No  Concerns today.   Review of Systems Patient reports no vision/ hearing changes, adenopathy,fever, weight change,  persistant/recurrent hoarseness , swallowing issues, chest pain, palpitations, edema, persistant/recurrent cough, hemoptysis, dyspnea (rest/exertional/paroxysmal nocturnal), gastrointestinal bleeding (melena, rectal bleeding), abdominal pain, significant heartburn, bowel changes, GU symptoms (dysuria, hematuria, incontinence), Gyn symptoms (abnormal  bleeding, pain),  syncope, focal weakness, memory loss, numbness & tingling, skin/hair/nail changes, abnormal bruising or bleeding, anxiety, or depression.     Objective:   Physical Exam General Appearance:    Alert, cooperative, no distress, appears stated age  Head:    Normocephalic, without obvious abnormality, atraumatic  Eyes:    PERRL, conjunctiva/corneas clear, EOM's intact, fundi    benign, both eyes  Ears:    Normal TM's and external ear canals, both ears  Nose:   Nares normal, septum midline, mucosa normal, no drainage    or sinus tenderness  Throat:   Lips, mucosa, and tongue normal; teeth and gums normal  Neck:   Supple, symmetrical, trachea midline, no adenopathy;    Thyroid: no enlargement/tenderness/nodules  Back:     Symmetric, no curvature, ROM normal, no CVA tenderness  Lungs:     Clear to auscultation bilaterally, respirations unlabored  Chest Wall:    No tenderness or deformity   Heart:    Regular rate and rhythm, S1 and S2 normal, no murmur, rub   or gallop  Breast Exam:    Deferred to GYN  Abdomen:     Soft, non-tender, bowel sounds active all four quadrants,    no masses, no organomegaly  Genitalia:    Deferred to GYN  Rectal:    Extremities:   Extremities normal, atraumatic, no cyanosis or edema  Pulses:   2+ and symmetric all  extremities  Skin:   Skin color, texture, turgor normal, no rashes or lesions  Lymph nodes:   Cervical, supraclavicular, and axillary nodes normal  Neurologic:   CNII-XII intact, normal strength, sensation and reflexes    throughout          Assessment & Plan:

## 2017-11-12 NOTE — Patient Instructions (Addendum)
Follow up in 6 months to recheck BP and cholesterol We'll notify you of your lab results and make any changes if needed Keep up the good work on healthy diet and regular exercise- you look great!!! Call with any questions or concerns Happy Holidays!!!   Bring a copy of your living will and/or healthcare power of attorney to your next office visit.  Continue doing brain stimulating activities (puzzles, reading, adult coloring books, staying active) to keep memory sharp.   Health Maintenance, Female Adopting a healthy lifestyle and getting preventive care can go a long way to promote health and wellness. Talk with your health care provider about what schedule of regular examinations is right for you. This is a good chance for you to check in with your provider about disease prevention and staying healthy. In between checkups, there are plenty of things you can do on your own. Experts have done a lot of research about which lifestyle changes and preventive measures are most likely to keep you healthy. Ask your health care provider for more information. Weight and diet Eat a healthy diet  Be sure to include plenty of vegetables, fruits, low-fat dairy products, and lean protein.  Do not eat a lot of foods high in solid fats, added sugars, or salt.  Get regular exercise. This is one of the most important things you can do for your health. ? Most adults should exercise for at least 150 minutes each week. The exercise should increase your heart rate and make you sweat (moderate-intensity exercise). ? Most adults should also do strengthening exercises at least twice a week. This is in addition to the moderate-intensity exercise.  Maintain a healthy weight  Body mass index (BMI) is a measurement that can be used to identify possible weight problems. It estimates body fat based on height and weight. Your health care provider can help determine your BMI and help you achieve or maintain a healthy  weight.  For females 53 years of age and older: ? A BMI below 18.5 is considered underweight. ? A BMI of 18.5 to 24.9 is normal. ? A BMI of 25 to 29.9 is considered overweight. ? A BMI of 30 and above is considered obese.  Watch levels of cholesterol and blood lipids  You should start having your blood tested for lipids and cholesterol at 70 years of age, then have this test every 5 years.  You may need to have your cholesterol levels checked more often if: ? Your lipid or cholesterol levels are high. ? You are older than 71 years of age. ? You are at high risk for heart disease.  Cancer screening Lung Cancer  Lung cancer screening is recommended for adults 53-70 years old who are at high risk for lung cancer because of a history of smoking.  A yearly low-dose CT scan of the lungs is recommended for people who: ? Currently smoke. ? Have quit within the past 15 years. ? Have at least a 30-pack-year history of smoking. A pack year is smoking an average of one pack of cigarettes a day for 1 year.  Yearly screening should continue until it has been 15 years since you quit.  Yearly screening should stop if you develop a health problem that would prevent you from having lung cancer treatment.  Breast Cancer  Practice breast self-awareness. This means understanding how your breasts normally appear and feel.  It also means doing regular breast self-exams. Let your health care provider know about any changes,  no matter how small.  If you are in your 20s or 30s, you should have a clinical breast exam (CBE) by a health care provider every 1-3 years as part of a regular health exam.  If you are 68 or older, have a CBE every year. Also consider having a breast X-ray (mammogram) every year.  If you have a family history of breast cancer, talk to your health care provider about genetic screening.  If you are at high risk for breast cancer, talk to your health care provider about having an  MRI and a mammogram every year.  Breast cancer gene (BRCA) assessment is recommended for women who have family members with BRCA-related cancers. BRCA-related cancers include: ? Breast. ? Ovarian. ? Tubal. ? Peritoneal cancers.  Results of the assessment will determine the need for genetic counseling and BRCA1 and BRCA2 testing.  Cervical Cancer Your health care provider may recommend that you be screened regularly for cancer of the pelvic organs (ovaries, uterus, and vagina). This screening involves a pelvic examination, including checking for microscopic changes to the surface of your cervix (Pap test). You may be encouraged to have this screening done every 3 years, beginning at age 7.  For women ages 62-65, health care providers may recommend pelvic exams and Pap testing every 3 years, or they may recommend the Pap and pelvic exam, combined with testing for human papilloma virus (HPV), every 5 years. Some types of HPV increase your risk of cervical cancer. Testing for HPV may also be done on women of any age with unclear Pap test results.  Other health care providers may not recommend any screening for nonpregnant women who are considered low risk for pelvic cancer and who do not have symptoms. Ask your health care provider if a screening pelvic exam is right for you.  If you have had past treatment for cervical cancer or a condition that could lead to cancer, you need Pap tests and screening for cancer for at least 20 years after your treatment. If Pap tests have been discontinued, your risk factors (such as having a new sexual partner) need to be reassessed to determine if screening should resume. Some women have medical problems that increase the chance of getting cervical cancer. In these cases, your health care provider may recommend more frequent screening and Pap tests.  Colorectal Cancer  This type of cancer can be detected and often prevented.  Routine colorectal cancer screening  usually begins at 71 years of age and continues through 71 years of age.  Your health care provider may recommend screening at an earlier age if you have risk factors for colon cancer.  Your health care provider may also recommend using home test kits to check for hidden blood in the stool.  A small camera at the end of a tube can be used to examine your colon directly (sigmoidoscopy or colonoscopy). This is done to check for the earliest forms of colorectal cancer.  Routine screening usually begins at age 55.  Direct examination of the colon should be repeated every 5-10 years through 71 years of age. However, you may need to be screened more often if early forms of precancerous polyps or small growths are found.  Skin Cancer  Check your skin from head to toe regularly.  Tell your health care provider about any new moles or changes in moles, especially if there is a change in a mole's shape or color.  Also tell your health care provider if you  have a mole that is larger than the size of a pencil eraser.  Always use sunscreen. Apply sunscreen liberally and repeatedly throughout the day.  Protect yourself by wearing long sleeves, pants, a wide-brimmed hat, and sunglasses whenever you are outside.  Heart disease, diabetes, and high blood pressure  High blood pressure causes heart disease and increases the risk of stroke. High blood pressure is more likely to develop in: ? People who have blood pressure in the high end of the normal range (130-139/85-89 mm Hg). ? People who are overweight or obese. ? People who are African American.  If you are 34-75 years of age, have your blood pressure checked every 3-5 years. If you are 60 years of age or older, have your blood pressure checked every year. You should have your blood pressure measured twice-once when you are at a hospital or clinic, and once when you are not at a hospital or clinic. Record the average of the two measurements. To check  your blood pressure when you are not at a hospital or clinic, you can use: ? An automated blood pressure machine at a pharmacy. ? A home blood pressure monitor.  If you are between 5 years and 72 years old, ask your health care provider if you should take aspirin to prevent strokes.  Have regular diabetes screenings. This involves taking a blood sample to check your fasting blood sugar level. ? If you are at a normal weight and have a low risk for diabetes, have this test once every three years after 71 years of age. ? If you are overweight and have a high risk for diabetes, consider being tested at a younger age or more often. Preventing infection Hepatitis B  If you have a higher risk for hepatitis B, you should be screened for this virus. You are considered at high risk for hepatitis B if: ? You were born in a country where hepatitis B is common. Ask your health care provider which countries are considered high risk. ? Your parents were born in a high-risk country, and you have not been immunized against hepatitis B (hepatitis B vaccine). ? You have HIV or AIDS. ? You use needles to inject street drugs. ? You live with someone who has hepatitis B. ? You have had sex with someone who has hepatitis B. ? You get hemodialysis treatment. ? You take certain medicines for conditions, including cancer, organ transplantation, and autoimmune conditions.  Hepatitis C  Blood testing is recommended for: ? Everyone born from [birth year] through 1965. ? Anyone with known risk factors for hepatitis C.  Sexually transmitted infections (STIs)  You should be screened for sexually transmitted infections (STIs) including gonorrhea and chlamydia if: ? You are sexually active and are younger than 71 years of age. ? You are older than 71 years of age and your health care provider tells you that you are at risk for this type of infection. ? Your sexual activity has changed since you were last screened and you  are at an increased risk for chlamydia or gonorrhea. Ask your health care provider if you are at risk.  If you do not have HIV, but are at risk, it may be recommended that you take a prescription medicine daily to prevent HIV infection. This is called pre-exposure prophylaxis (PrEP). You are considered at risk if: ? You are sexually active and do not regularly use condoms or know the HIV status of your partner(s). ? You take drugs by injection. ?  You are sexually active with a partner who has HIV.  Talk with your health care provider about whether you are at high risk of being infected with HIV. If you choose to begin PrEP, you should first be tested for HIV. You should then be tested every 3 months for as long as you are taking PrEP. Pregnancy  If you are premenopausal and you may become pregnant, ask your health care provider about preconception counseling.  If you may become pregnant, take 400 to 800 micrograms (mcg) of folic acid every day.  If you want to prevent pregnancy, talk to your health care provider about birth control (contraception). Osteoporosis and menopause  Osteoporosis is a disease in which the bones lose minerals and strength with aging. This can result in serious bone fractures. Your risk for osteoporosis can be identified using a bone density scan.  If you are 18 years of age or older, or if you are at risk for osteoporosis and fractures, ask your health care provider if you should be screened.  Ask your health care provider whether you should take a calcium or vitamin D supplement to lower your risk for osteoporosis.  Menopause may have certain physical symptoms and risks.  Hormone replacement therapy may reduce some of these symptoms and risks. Talk to your health care provider about whether hormone replacement therapy is right for you. Follow these instructions at home:  Schedule regular health, dental, and eye exams.  Stay current with your  immunizations.  Do not use any tobacco products including cigarettes, chewing tobacco, or electronic cigarettes.  If you are pregnant, do not drink alcohol.  If you are breastfeeding, limit how much and how often you drink alcohol.  Limit alcohol intake to no more than 1 drink per day for nonpregnant women. One drink equals 12 ounces of beer, 5 ounces of wine, or 1 ounces of hard liquor.  Do not use street drugs.  Do not share needles.  Ask your health care provider for help if you need support or information about quitting drugs.  Tell your health care provider if you often feel depressed.  Tell your health care provider if you have ever been abused or do not feel safe at home. This information is not intended to replace advice given to you by your health care provider. Make sure you discuss any questions you have with your health care provider. Document Released: 06/17/2011 Document Revised: 05/09/2016 Document Reviewed: 09/05/2015 [employer]vier Interactive Patient Education  [employer].

## 2017-11-13 ENCOUNTER — Encounter: Payer: Self-pay | Admitting: General Practice

## 2017-12-01 ENCOUNTER — Other Ambulatory Visit: Payer: Self-pay | Admitting: Family Medicine

## 2017-12-03 ENCOUNTER — Encounter: Payer: Self-pay | Admitting: Physician Assistant

## 2017-12-03 ENCOUNTER — Ambulatory Visit (INDEPENDENT_AMBULATORY_CARE_PROVIDER_SITE_OTHER): Payer: Medicare Other | Admitting: Physician Assistant

## 2017-12-03 ENCOUNTER — Other Ambulatory Visit: Payer: Self-pay

## 2017-12-03 VITALS — BP 132/82 | HR 74 | Temp 99.0°F | Resp 17 | Ht 64.0 in | Wt 149.2 lb

## 2017-12-03 DIAGNOSIS — R6889 Other general symptoms and signs: Secondary | ICD-10-CM

## 2017-12-03 DIAGNOSIS — B9689 Other specified bacterial agents as the cause of diseases classified elsewhere: Secondary | ICD-10-CM | POA: Diagnosis not present

## 2017-12-03 DIAGNOSIS — J019 Acute sinusitis, unspecified: Secondary | ICD-10-CM | POA: Diagnosis not present

## 2017-12-03 LAB — POC INFLUENZA A&B (BINAX/QUICKVUE)
Influenza A, POC: NEGATIVE
Influenza B, POC: NEGATIVE

## 2017-12-03 MED ORDER — AMOXICILLIN-POT CLAVULANATE 875-125 MG PO TABS: 1.0000 | ORAL_TABLET | Freq: Two times a day (BID) | ORAL | 0 refills | Status: DC

## 2017-12-03 NOTE — Patient Instructions (Signed)
Please take antibiotic as directed.  Increase fluid intake.  Use Saline nasal spray.  Take a daily multivitamin.  Place a humidifier in the bedroom.  Please call or return clinic if symptoms are not improving.  Sinusitis Sinusitis is redness, soreness, and swelling (inflammation) of the paranasal sinuses. Paranasal sinuses are air pockets within the bones of your face (beneath the eyes, the middle of the forehead, or above the eyes). In healthy paranasal sinuses, mucus is able to drain out, and air is able to circulate through them by way of your nose. However, when your paranasal sinuses are inflamed, mucus and air can become trapped. This can allow bacteria and other germs to grow and cause infection. Sinusitis can develop quickly and last only a short time (acute) or continue over a long period (chronic). Sinusitis that lasts for more than 12 weeks is considered chronic.  CAUSES  Causes of sinusitis include:  Allergies.  Structural abnormalities, such as displacement of the cartilage that separates your nostrils (deviated septum), which can decrease the air flow through your nose and sinuses and affect sinus drainage.  Functional abnormalities, such as when the small hairs (cilia) that line your sinuses and help remove mucus do not work properly or are not present. SYMPTOMS  Symptoms of acute and chronic sinusitis are the same. The primary symptoms are pain and pressure around the affected sinuses. Other symptoms include:  Upper toothache.  Earache.  Headache.  Bad breath.  Decreased sense of smell and taste.  A cough, which worsens when you are lying flat.  Fatigue.  Fever.  Thick drainage from your nose, which often is green and may contain pus (purulent).  Swelling and warmth over the affected sinuses. DIAGNOSIS  Your caregiver will perform a physical exam. During the exam, your caregiver may:  Look in your nose for signs of abnormal growths in your nostrils (nasal  polyps).  Tap over the affected sinus to check for signs of infection.  View the inside of your sinuses (endoscopy) with a special imaging device with a light attached (endoscope), which is inserted into your sinuses. If your caregiver suspects that you have chronic sinusitis, one or more of the following tests may be recommended:  Allergy tests.  Nasal culture A sample of mucus is taken from your nose and sent to a lab and screened for bacteria.  Nasal cytology A sample of mucus is taken from your nose and examined by your caregiver to determine if your sinusitis is related to an allergy. TREATMENT  Most cases of acute sinusitis are related to a viral infection and will resolve on their own within 10 days. Sometimes medicines are prescribed to help relieve symptoms (pain medicine, decongestants, nasal steroid sprays, or saline sprays).  However, for sinusitis related to a bacterial infection, your caregiver will prescribe antibiotic medicines. These are medicines that will help kill the bacteria causing the infection.  Rarely, sinusitis is caused by a fungal infection. In theses cases, your caregiver will prescribe antifungal medicine. For some cases of chronic sinusitis, surgery is needed. Generally, these are cases in which sinusitis recurs more than 3 times per year, despite other treatments. HOME CARE INSTRUCTIONS   Drink plenty of water. Water helps thin the mucus so your sinuses can drain more easily.  Use a humidifier.  Inhale steam 3 to 4 times a day (for example, sit in the bathroom with the shower running).  Apply a warm, moist washcloth to your face 3 to 4 times a day,  or as directed by your caregiver.  Use saline nasal sprays to help moisten and clean your sinuses.  Take over-the-counter or prescription medicines for pain, discomfort, or fever only as directed by your caregiver. SEEK IMMEDIATE MEDICAL CARE IF:  You have increasing pain or severe headaches.  You have  nausea, vomiting, or drowsiness.  You have swelling around your face.  You have vision problems.  You have a stiff neck.  You have difficulty breathing. MAKE SURE YOU:   Understand these instructions.  Will watch your condition.  Will get help right away if you are not doing well or get worse. Document Released: 12/02/2005 Document Revised: 02/24/2012 Document Reviewed: 12/17/2011 [facility] Patient Information 2014 [place], [place].

## 2017-12-03 NOTE — Progress Notes (Signed)
Patient presents to clinic today c/o cough, congestion, PND, sinus pressure, sinus pain and maxillary facial pain. Also notes fever over the past couple of days with Tmax at 100. Feels symptoms are progressively worsening. Denies chest pain or SOB. Denies recent travel or sick contact. Has taken Tylenol and Sudafed for symptom relief.  Past Medical History:  Diagnosis Date  . Allergy   . Arthritis   . Asthma   . Cataract    left eye and stable   . Cervical disc disease    bulging  . Diverticulitis   . Diverticulosis   . GERD (gastroesophageal reflux disease)   . Hyperlipidemia   . Hypertension   . Osteopenia   . Osteoporosis   . PONV (postoperative nausea and vomiting)   . Status post dilation of esophageal narrowing     Current Outpatient Medications on File Prior to Visit  Medication Sig Dispense Refill  . Ascorbic Acid (VITAMIN C PO) Take by mouth.    Marland Kitchen atorvastatin (LIPITOR) 10 MG tablet Take 1 tablet by mouth daily.  3  . azelastine (ASTELIN) 0.1 % nasal spray U 1 SPR IEN BID PRN  5  . beta carotene w/minerals (OCUVITE) tablet Take 1 tablet by mouth daily.    . Calcium Citrate (CITRACAL PO) Take 2 tablets by mouth daily.    . cetirizine (ZYRTEC) 10 MG tablet Take 1 tablet (10 mg total) by mouth daily. 30 tablet 11  . clobetasol ointment (TEMOVATE) 0.05 % Apply topically 2 (two) times daily as needed. 30 g 0  . Coenzyme Q10 (COQ10) 200 MG CAPS Take 1 tablet by mouth daily.    Marland Kitchen EPIPEN 2-PAK 0.3 MG/0.3ML SOAJ injection Inject 1 mg into the muscle as needed. Reported on 06/28/2016    . Ergocalciferol (VITAMIN D2) 400 units TABS Take 1 capsule by mouth daily.    Marland Kitchen esomeprazole (NEXIUM) 20 MG capsule Take 20 mg by mouth daily at 12 noon.    . fish oil-omega-3 fatty acids 1000 MG capsule Take 2 g by mouth daily.    Marland Kitchen lactobacillus acidophilus (BACID) TABS tablet Take 2 tablets by mouth 2 (two) times a week.    . losartan (COZAAR) 100 MG tablet TAKE 1 TABLET(100 MG) BY MOUTH  DAILY 90 tablet 0  . montelukast (SINGULAIR) 10 MG tablet Take 1 tablet (10 mg total) by mouth at bedtime. 30 tablet 3  . Multiple Vitamin (MULTIVITAMIN) capsule Take 1 capsule by mouth daily.    . Olopatadine HCl (PATANASE) 0.6 % SOLN Place 1 puff into the nose as needed. Reported on 06/27/2016    . PROAIR HFA 108 (90 Base) MCG/ACT inhaler INL 2 PFS PO Q 4 TO 6 H PRN  0   No current facility-administered medications on file prior to visit.     Allergies  Allergen Reactions  . Evista [Raloxifene]     Respiratory infections, shortness of breath(not anaphylaxis), leg heaviness.  . Fosamax [Alendronate] Other (See Comments)    "Due to Esophagus problems"--narrow    Family History  Problem Relation Age of Onset  . Hypertension Mother   . Diabetes Father   . Hypertension Father   . Heart disease Father   . Diabetes Sister   . Hypertension Sister   . Thyroid disease Sister   . Heart disease Sister   . Throat cancer Paternal Uncle        smoker  . Lung cancer Cousin        smoker  .  Colon cancer Neg Hx   . Pancreatic cancer Neg Hx   . Stomach cancer Neg Hx   . Liver disease Neg Hx   . Colon polyps Neg Hx   . Esophageal cancer Neg Hx   . Rectal cancer Neg Hx     Social History   Socioeconomic History  . Marital status: Married    Spouse name: None  . Number of children: 1  . Years of education: None  . Highest education level: None  Social Needs  . Financial resource strain: None  . Food insecurity - worry: None  . Food insecurity - inability: None  . Transportation needs - medical: None  . Transportation needs - non-medical: None  Occupational History  . Occupation: retired  Tobacco Use  . Smoking status: Never Smoker  . Smokeless tobacco: Never Used  Substance and Sexual Activity  . Alcohol use: Yes    Alcohol/week: 4.2 oz    Types: 7 Glasses of wine per week    Comment: 7 glasses of wine per week  . Drug use: No  . Sexual activity: Yes    Partners: Male     Birth control/protection: Other-see comments, Post-menopausal    Comment: TVH--Still has ovaries  Other Topics Concern  . None  Social History Narrative  . None   Review of Systems - See HPI.  All other ROS are negative.  BP 132/82   Pulse 74   Temp 99 F (37.2 C) (Oral)   Resp 17   Ht 5\' 4"  (1.626 m)   Wt 149 lb 4 oz (67.7 kg)   LMP 12/16/1993 (Approximate)   SpO2 98%   BMI 25.62 kg/m   Physical Exam  Constitutional: She is oriented to person, place, and time and well-developed, well-nourished, and in no distress.  HENT:  Head: Normocephalic and atraumatic.  Right Ear: Tympanic membrane is not retracted.  Left Ear: Tympanic membrane is retracted.  Nose: Mucosal edema and rhinorrhea present. Right sinus exhibits maxillary sinus tenderness. Left sinus exhibits maxillary sinus tenderness.  Mouth/Throat: Uvula is midline, oropharynx is clear and moist and mucous membranes are normal.  Eyes: Conjunctivae are normal.  Neck: Neck supple.  Cardiovascular: Normal rate, regular rhythm, normal heart sounds and intact distal pulses.  Pulmonary/Chest: Effort normal and breath sounds normal. No respiratory distress. She has no wheezes. She has no rales. She exhibits no tenderness.  Lymphadenopathy:    She has no cervical adenopathy.  Neurological: She is alert and oriented to person, place, and time.  Skin: Skin is warm and dry. No rash noted.  Psychiatric: Affect normal.  Vitals reviewed.    Recent Results (from the past 2160 hour(s))  Lipid panel     Status: Abnormal   Collection Time: 11/12/17 10:40 AM  Result Value Ref Range   Cholesterol 205 (H) 0 - 200 mg/dL    Comment: ATP III Classification       Desirable:  < 200 mg/dL               Borderline High:  200 - 239 mg/dL          High:  > = 240 mg/dL   Triglycerides 153.0 (H) 0.0 - 149.0 mg/dL    Comment: Normal:  <150 mg/dLBorderline High:  150 - 199 mg/dL   HDL 63.80 >39.00 mg/dL   VLDL 30.6 0.0 - 40.0 mg/dL   LDL  Cholesterol 111 (H) 0 - 99 mg/dL   Total CHOL/HDL Ratio 3  Comment:                Men          Women1/2 Average Risk     3.4          3.3Average Risk          5.0          4.42X Average Risk          9.6          7.13X Average Risk          15.0          11.0                       NonHDL 141.36     Comment: NOTE:  Non-HDL goal should be 30 mg/dL higher than patient's LDL goal (i.e. LDL goal of < 70 mg/dL, would have non-HDL goal of < 100 mg/dL)  Basic metabolic panel     Status: None   Collection Time: 11/12/17 10:40 AM  Result Value Ref Range   Sodium 143 135 - 145 mEq/L   Potassium 4.4 3.5 - 5.1 mEq/L   Chloride 105 96 - 112 mEq/L   CO2 26 19 - 32 mEq/L   Glucose, Bld 87 70 - 99 mg/dL   BUN 15 6 - 23 mg/dL   Creatinine, Ser 0.94 0.40 - 1.20 mg/dL   Calcium 9.8 8.4 - 10.5 mg/dL   GFR 62.26 >60.00 mL/min  TSH     Status: None   Collection Time: 11/12/17 10:40 AM  Result Value Ref Range   TSH 1.33 0.35 - 4.50 uIU/mL  Hepatic function panel     Status: None   Collection Time: 11/12/17 10:40 AM  Result Value Ref Range   Total Bilirubin 0.6 0.2 - 1.2 mg/dL   Bilirubin, Direct 0.1 0.0 - 0.3 mg/dL   Alkaline Phosphatase 68 39 - 117 U/L   AST 22 0 - 37 U/L   ALT 19 0 - 35 U/L   Total Protein 7.0 6.0 - 8.3 g/dL   Albumin 4.5 3.5 - 5.2 g/dL  CBC with Differential/Platelet     Status: None   Collection Time: 11/12/17 10:40 AM  Result Value Ref Range   WBC 4.0 4.0 - 10.5 K/uL   RBC 4.59 3.87 - 5.11 Mil/uL   Hemoglobin 14.3 12.0 - 15.0 g/dL   HCT 42.0 36.0 - 46.0 %   MCV 91.4 78.0 - 100.0 fl   MCHC 34.0 30.0 - 36.0 g/dL   RDW 13.9 11.5 - 15.5 %   Platelets 204.0 150.0 - 400.0 K/uL   Neutrophils Relative % 52.5 43.0 - 77.0 %   Lymphocytes Relative 35.2 12.0 - 46.0 %   Monocytes Relative 7.5 3.0 - 12.0 %   Eosinophils Relative 4.0 0.0 - 5.0 %   Basophils Relative 0.8 0.0 - 3.0 %   Neutro Abs 2.1 1.4 - 7.7 K/uL   Lymphs Abs 1.4 0.7 - 4.0 K/uL   Monocytes Absolute 0.3 0.1 - 1.0  K/uL   Eosinophils Absolute 0.2 0.0 - 0.7 K/uL   Basophils Absolute 0.0 0.0 - 0.1 K/uL  VITAMIN D 25 Hydroxy (Vit-D Deficiency, Fractures)     Status: None   Collection Time: 11/12/17 10:40 AM  Result Value Ref Range   VITD 72.77 30.00 - 100.00 ng/mL  POC Influenza A&B(BINAX/QUICKVUE)     Status: Normal   Collection Time: 12/03/17 10:44 AM  Result Value Ref Range   Influenza A, POC Negative Negative   Influenza B, POC Negative Negative    Assessment/Plan: 1. Acute bacterial sinusitis Flu test negative. + TTP sinuses and progressively worsening symptoms. Start Augmentin. Supportive measures and OTC medications reviewed with patient. Continue allergy medication. Follow-up if not improving. - amoxicillin-clavulanate (AUGMENTIN) 875-125 MG tablet; Take 1 tablet by mouth 2 (two) times daily.  Dispense: 14 tablet; Refill: 0    Leeanne Rio, PA-C

## 2017-12-22 ENCOUNTER — Ambulatory Visit
Admission: RE | Admit: 2017-12-22 | Discharge: 2017-12-22 | Disposition: A | Discharge status: Home / Self Care | Payer: Medicare Other | Source: Ambulatory Visit | Attending: Obstetrics and Gynecology | Admitting: Obstetrics and Gynecology

## 2017-12-22 DIAGNOSIS — Z1231 Encounter for screening mammogram for malignant neoplasm of breast: Secondary | ICD-10-CM

## 2018-01-06 ENCOUNTER — Other Ambulatory Visit: Payer: Self-pay | Admitting: Family Medicine

## 2018-02-28 ENCOUNTER — Other Ambulatory Visit: Payer: Self-pay | Admitting: Family Medicine

## 2018-04-03 ENCOUNTER — Encounter: Payer: Self-pay | Admitting: Family Medicine

## 2018-04-13 ENCOUNTER — Other Ambulatory Visit: Payer: Self-pay | Admitting: General Practice

## 2018-04-13 MED ORDER — LISINOPRIL 20 MG PO TABS: 20.0000 mg | ORAL_TABLET | Freq: Every day | ORAL | 3 refills | Status: DC

## 2018-04-29 ENCOUNTER — Encounter: Payer: Self-pay | Admitting: Family Medicine

## 2018-04-30 ENCOUNTER — Other Ambulatory Visit: Payer: Self-pay | Admitting: Family Medicine

## 2018-04-30 DIAGNOSIS — M21619 Bunion of unspecified foot: Secondary | ICD-10-CM

## 2018-04-30 DIAGNOSIS — M6740 Ganglion, unspecified site: Secondary | ICD-10-CM

## 2018-05-05 ENCOUNTER — Ambulatory Visit (INDEPENDENT_AMBULATORY_CARE_PROVIDER_SITE_OTHER): Payer: Medicare Other | Admitting: Family Medicine

## 2018-05-05 ENCOUNTER — Encounter: Payer: Self-pay | Admitting: General Practice

## 2018-05-05 ENCOUNTER — Other Ambulatory Visit: Payer: Self-pay

## 2018-05-05 ENCOUNTER — Encounter: Payer: Self-pay | Admitting: Family Medicine

## 2018-05-05 VITALS — BP 116/68 | HR 50 | Temp 97.5°F | Resp 15 | Ht 64.0 in | Wt 148.4 lb

## 2018-05-05 DIAGNOSIS — I10 Essential (primary) hypertension: Secondary | ICD-10-CM | POA: Diagnosis not present

## 2018-05-05 DIAGNOSIS — E785 Hyperlipidemia, unspecified: Secondary | ICD-10-CM | POA: Diagnosis not present

## 2018-05-05 LAB — BASIC METABOLIC PANEL
BUN: 14 mg/dL (ref 6–23)
CALCIUM: 9.4 mg/dL (ref 8.4–10.5)
CHLORIDE: 104 meq/L (ref 96–112)
CO2: 28 meq/L (ref 19–32)
CREATININE: 0.95 mg/dL (ref 0.40–1.20)
GFR: 61.42 mL/min (ref >60.00)
GLUCOSE: 85 mg/dL (ref 70–99)
Potassium: 4 mEq/L (ref 3.5–5.1)
Sodium: 141 mEq/L (ref 135–145)

## 2018-05-05 LAB — CBC WITH DIFFERENTIAL/PLATELET
BASOS ABS: 0 10*3/uL (ref 0.0–0.1)
Basophils Relative: 0.9 % (ref 0.0–3.0)
EOS ABS: 0.2 10*3/uL (ref 0.0–0.7)
Eosinophils Relative: 4 % (ref 0.0–5.0)
HEMATOCRIT: 39.1 % (ref 36.0–46.0)
HEMOGLOBIN: 13.3 g/dL (ref 12.0–15.0)
LYMPHS PCT: 38 % (ref 12.0–46.0)
Lymphs Abs: 1.4 10*3/uL (ref 0.7–4.0)
MCHC: 34.1 g/dL (ref 30.0–36.0)
MCV: 88.5 fl (ref 78.0–100.0)
MONOS PCT: 6.6 % (ref 3.0–12.0)
Monocytes Absolute: 0.2 10*3/uL (ref 0.1–1.0)
Neutro Abs: 1.9 10*3/uL (ref 1.4–7.7)
Neutrophils Relative %: 50.5 % (ref 43.0–77.0)
PLATELETS: 195 10*3/uL (ref 150.0–400.0)
RBC: 4.42 Mil/uL (ref 3.87–5.11)
RDW: 13 % (ref 11.5–15.5)
WBC: 3.8 10*3/uL — AB (ref 4.0–10.5)

## 2018-05-05 LAB — LIPID PANEL
Cholesterol: 158 mg/dL (ref 0–200)
HDL: 61.8 mg/dL (ref >39.00)
LDL Cholesterol: 76 mg/dL (ref 0–99)
NonHDL: 96.51
TRIGLYCERIDES: 101 mg/dL (ref 0.0–149.0)
Total CHOL/HDL Ratio: 3
VLDL: 20.2 mg/dL (ref 0.0–40.0)

## 2018-05-05 LAB — HEPATIC FUNCTION PANEL
ALBUMIN: 4.4 g/dL (ref 3.5–5.2)
ALT: 20 U/L (ref 0–35)
AST: 20 U/L (ref 0–37)
Alkaline Phosphatase: 60 U/L (ref 39–117)
Bilirubin, Direct: 0.1 mg/dL (ref 0.0–0.3)
TOTAL PROTEIN: 7.1 g/dL (ref 6.0–8.3)
Total Bilirubin: 0.6 mg/dL (ref 0.2–1.2)

## 2018-05-05 NOTE — Assessment & Plan Note (Signed)
Chronic problem.  Excellent control on Lisinopril.  Asymptomatic.  HR will drop low at times but she does not note dizziness, SOB, or fatigue.  Cautioned her that if these sxs occur, she needs to let me know so we can refer to Cardiology.  Pt expressed understanding and is in agreement w/ plan.

## 2018-05-05 NOTE — Progress Notes (Signed)
   Subjective:    Patient ID: Sarah [name], female    DOB: December 23, 1945, 72 y.o.   MRN: [id number]  HPI HTN- chronic problem, on Lisinopril 20 mg daily w/ excellent control.  Pt is walking regularly.  No CP, SOB, HAs, visual changes, edema.  Hyperlipidemia- chronic problem, on Lipitor 10mg  daily w/ hx of good control.  No abd pain, N/V, myalgias.   Review of Systems For ROS see HPI     Objective:   Physical Exam  Constitutional: She is oriented to person, place, and time. She appears well-developed and well-nourished. No distress.  HENT:  Head: Normocephalic and atraumatic.  Eyes: Pupils are equal, round, and reactive to light. Conjunctivae and EOM are normal.  Neck: Normal range of motion. Neck supple. No thyromegaly present.  Cardiovascular: Normal rate, regular rhythm, normal heart sounds and intact distal pulses.  No murmur heard. Pulmonary/Chest: Effort normal and breath sounds normal. No respiratory distress.  Abdominal: Soft. She exhibits no distension. There is no tenderness.  Musculoskeletal: She exhibits no edema.  Lymphadenopathy:    She has no cervical adenopathy.  Neurological: She is alert and oriented to person, place, and time.  Skin: Skin is warm and dry.  Psychiatric: She has a normal mood and affect. Her behavior is normal.  Vitals reviewed.         Assessment & Plan:

## 2018-05-05 NOTE — Patient Instructions (Addendum)
Schedule your complete physical in 6 months and your Medicare Wellness Visit w/ [facility] notify you of your lab results and make any changes if needed Keep up the good work on healthy diet and regular exercise- you look great!!! Call with any questions or concerns Have a great summer!!!

## 2018-05-05 NOTE — Assessment & Plan Note (Signed)
Chronic problem.  Tolerating statin w/o difficulty.  Applauded her efforts at diet and exercise.  Check labs.  Adjust meds prn  

## 2018-05-06 ENCOUNTER — Other Ambulatory Visit: Payer: Self-pay | Admitting: Podiatry

## 2018-05-06 ENCOUNTER — Ambulatory Visit (INDEPENDENT_AMBULATORY_CARE_PROVIDER_SITE_OTHER): Payer: Medicare Other | Admitting: Podiatry

## 2018-05-06 ENCOUNTER — Encounter: Payer: Self-pay | Admitting: Podiatry

## 2018-05-06 ENCOUNTER — Ambulatory Visit (INDEPENDENT_AMBULATORY_CARE_PROVIDER_SITE_OTHER): Payer: Medicare Other

## 2018-05-06 VITALS — BP 121/68 | HR 55 | Resp 16

## 2018-05-06 DIAGNOSIS — M21619 Bunion of unspecified foot: Secondary | ICD-10-CM

## 2018-05-06 DIAGNOSIS — M778 Other enthesopathies, not elsewhere classified: Secondary | ICD-10-CM

## 2018-05-06 DIAGNOSIS — M7751 Other enthesopathy of right foot: Secondary | ICD-10-CM

## 2018-05-06 DIAGNOSIS — M21612 Bunion of left foot: Secondary | ICD-10-CM

## 2018-05-06 DIAGNOSIS — M779 Enthesopathy, unspecified: Secondary | ICD-10-CM

## 2018-05-06 MED ORDER — TRIAMCINOLONE ACETONIDE 10 MG/ML IJ SUSP
10.0000 mg | Freq: Once | INTRAMUSCULAR | Status: AC
  Administered 2018-05-06: 10 mg

## 2018-05-06 NOTE — Patient Instructions (Signed)

## 2018-05-06 NOTE — Progress Notes (Signed)
Subjective:   Patient ID: Sarah [name], female   DOB: 72 y.o.   MRN: [id number]   HPI Patient presents stating she is having a lot of discomfort in the big toe joint right that started recently and she is noted a bump and is known for a long time that she has arthritis in the joint but it is just started to become discomforting for her.  Patient does not smoke and likes to be active   Review of Systems  All other systems reviewed and are negative.       Objective:  Physical Exam  Constitutional: She appears well-developed and well-nourished.  Cardiovascular: Intact distal pulses.  Pulmonary/Chest: Effort normal.  Musculoskeletal: Normal range of motion.  Neurological: She is alert.  Skin: Skin is warm.  Nursing note and vitals reviewed.   Neurovascular status found to be intact muscle strength is adequate range of motion within normal limits with severe loss of motion first MPJ right with crepitus in the joint bone spur formation dorsal and lateral within the joint surface itself and mild edema of the lesser digits but is not painful currently.  Patient has good digital perfusion well oriented x3     Assessment:  Hallux rigidus deformity right with inflammatory capsulitis of the joint and mild inflammation of the lesser MPJs     Plan:  H&P x-ray reviewed and today I went ahead and injected the capsule of the first MPJ 3 mg Kenalog problem Xylocaine after sterile prep.  I then discussed possibility for hallux fusion or joint implantation if symptoms persist patient will be seen back to recheck  X-rays indicate there is severe arthritis around the first MPJ right and possibility for some form of systemic arthritis or gout condition

## 2018-05-06 NOTE — Progress Notes (Signed)
   Subjective:    Patient ID: Sarah [name], female    DOB: [birth year], 72 y.o.   MRN: [id number]  HPI    Review of Systems  All other systems reviewed and are negative.      Objective:   Physical Exam        Assessment & Plan:

## 2018-05-25 ENCOUNTER — Encounter: Payer: Self-pay | Admitting: Family Medicine

## 2018-05-25 ENCOUNTER — Other Ambulatory Visit: Payer: Self-pay

## 2018-05-25 ENCOUNTER — Ambulatory Visit (INDEPENDENT_AMBULATORY_CARE_PROVIDER_SITE_OTHER): Payer: Medicare Other | Admitting: Family Medicine

## 2018-05-25 VITALS — BP 112/78 | HR 52 | Temp 98.3°F | Resp 14 | Ht 64.0 in | Wt 147.4 lb

## 2018-05-25 DIAGNOSIS — J3089 Other allergic rhinitis: Secondary | ICD-10-CM

## 2018-05-25 MED ORDER — MONTELUKAST SODIUM 10 MG PO TABS: 10.0000 mg | ORAL_TABLET | Freq: Every day | ORAL | 3 refills | Status: DC

## 2018-05-25 MED ORDER — PREDNISONE 10 MG PO TABS: ORAL_TABLET | ORAL | 0 refills | Status: DC

## 2018-05-25 NOTE — Patient Instructions (Signed)
Follow up as needed or as scheduled START the prednisone as directed- 3 pills at the same time x3 days (w/ food) and then 2 pills at the same time x3 days and then 1 tab daily RESTART the Singulair nightly CONTINUE the Zyrtec and Flonase daily Drink plenty of fluids REST! Call with any questions or concerns Have a great summer!!

## 2018-05-25 NOTE — Progress Notes (Signed)
   Subjective:    Patient ID: Sarah [name], female    DOB: 03-Aug-1946, 72 y.o.   MRN: [id number]  HPI 'allergy crud'- pt has been trying to ignore her worsening sxs b/c she had other things going on (has been worried about a possible tumor in her jaw).  Taking Zyrtec daily, using Flonase.  Has lost Singulair.  Taking Sudafed during the day to help w/ cough.  Had HA this AM but denies sinus pain.  No fevers.  No ear pain.  No N/V.  + cough- not productive.   Review of Systems For ROS see HPI     Objective:   Physical Exam  Constitutional: She appears well-developed and well-nourished. No distress.  HENT:  Head: Normocephalic and atraumatic.  Right Ear: Tympanic membrane normal.  Left Ear: Tympanic membrane normal.  Nose: Mucosal edema and rhinorrhea present. Right sinus exhibits no maxillary sinus tenderness and no frontal sinus tenderness. Left sinus exhibits no maxillary sinus tenderness and no frontal sinus tenderness.  Mouth/Throat: Mucous membranes are normal. Posterior oropharyngeal erythema (w/ PND) present.  Eyes: Pupils are equal, round, and reactive to light. Conjunctivae and EOM are normal.  Neck: Normal range of motion. Neck supple.  Cardiovascular: Normal rate, regular rhythm and normal heart sounds.  Pulmonary/Chest: Effort normal and breath sounds normal. No respiratory distress. She has no wheezes. She has no rales.  Lymphadenopathy:    She has no cervical adenopathy.  Vitals reviewed.         Assessment & Plan:

## 2018-05-25 NOTE — Assessment & Plan Note (Signed)
Deteriorated.  Pt has not been on her Singulair recently.  Restart this.  Continue the Claritin and Flonase daily.  Start Pred taper to improve sinus inflammation.  No evidence of infxn so no need for abx.  Pt expressed understanding and is in agreement w/ plan.

## 2018-05-30 ENCOUNTER — Other Ambulatory Visit: Payer: Self-pay | Admitting: Family Medicine

## 2018-07-03 ENCOUNTER — Encounter: Payer: Self-pay | Admitting: Family Medicine

## 2018-07-06 ENCOUNTER — Ambulatory Visit (INDEPENDENT_AMBULATORY_CARE_PROVIDER_SITE_OTHER): Payer: Medicare Other | Admitting: Physician Assistant

## 2018-07-06 ENCOUNTER — Other Ambulatory Visit: Payer: Self-pay

## 2018-07-06 ENCOUNTER — Encounter: Payer: Self-pay | Admitting: Physician Assistant

## 2018-07-06 VITALS — BP 110/70 | HR 60 | Temp 98.3°F | Resp 14 | Ht 64.0 in | Wt 148.0 lb

## 2018-07-06 DIAGNOSIS — J310 Chronic rhinitis: Secondary | ICD-10-CM

## 2018-07-06 MED ORDER — AZELASTINE HCL 0.1 % NA SOLN
NASAL | 5 refills | Status: DC
Start: 2018-07-06 — End: 2020-02-21

## 2018-07-06 NOTE — Patient Instructions (Signed)
Please keep well-hydrated and get plenty of rest.  Continue chronic allergy medications, adding on the Astelin to your regimen. Do your saline nasal rinse in the evening.  If not improving, we will need to get you back in with an allergist or ENT specialist.

## 2018-07-06 NOTE — Progress Notes (Signed)
Patient presents to clinic today c/o ongoing symptoms of chronic rhinitis. Notes tickle in the throat with PND, rhinorrhea, nasal congestion, and dry cough that is worse at night. Denies fever, chills, sinus pain, ear pain or tooth pain. Denies chest congestion. Is taking Zyrtec, Singulair and Flonase as directed. Was previously on allergy shots x 20 years. Notes she used to also be on another allergy nasal spray that helped quite a bit..    Past Medical History:  Diagnosis Date  . Allergy   . Arthritis   . Asthma   . Cataract    left eye and stable   . Cervical disc disease    bulging  . Diverticulitis   . Diverticulosis   . GERD (gastroesophageal reflux disease)   . Hyperlipidemia   . Hypertension   . Osteopenia   . Osteoporosis   . PONV (postoperative nausea and vomiting)   . Status post dilation of esophageal narrowing     Current Outpatient Medications on File Prior to Visit  Medication Sig Dispense Refill  . Ascorbic Acid (VITAMIN C PO) Take by mouth.    Marland Kitchen atorvastatin (LIPITOR) 10 MG tablet TAKE 1 TABLET BY MOUTH EVERY DAY 30 tablet 6  . beta carotene w/minerals (OCUVITE) tablet Take 1 tablet by mouth daily.    . Calcium Citrate (CITRACAL PO) Take 2 tablets by mouth daily.    . cetirizine (ZYRTEC) 10 MG tablet Take 1 tablet (10 mg total) by mouth daily. 30 tablet 11  . clobetasol ointment (TEMOVATE) 0.05 % Apply topically 2 (two) times daily as needed. 30 g 0  . Coenzyme Q10 (COQ10) 200 MG CAPS Take 1 tablet by mouth daily.    Marland Kitchen EPIPEN 2-PAK 0.3 MG/0.3ML SOAJ injection Inject 1 mg into the muscle as needed. Reported on 06/28/2016    . Ergocalciferol (VITAMIN D2) 400 units TABS Take 1 capsule by mouth daily.    Marland Kitchen esomeprazole (NEXIUM) 20 MG capsule Take 20 mg by mouth daily at 12 noon.    . fish oil-omega-3 fatty acids 1000 MG capsule Take 2 g by mouth daily.    . fluorouracil (EFUDEX) 5 % cream APPLY ON THE SKIN NIGHTLY TO HANDS UP TO 3 WEEKS AS TOLERATED  0  .  fluticasone (FLONASE) 50 MCG/ACT nasal spray Place 2 sprays into both nostrils daily.    Marland Kitchen lactobacillus acidophilus (BACID) TABS tablet Take 2 tablets by mouth 2 (two) times a week.    Marland Kitchen lisinopril (PRINIVIL,ZESTRIL) 20 MG tablet Take 1 tablet (20 mg total) by mouth daily. 30 tablet 3  . losartan (COZAAR) 100 MG tablet TAKE 1 TABLET(100 MG) BY MOUTH DAILY 90 tablet 0  . montelukast (SINGULAIR) 10 MG tablet Take 1 tablet (10 mg total) by mouth at bedtime. 30 tablet 3  . Multiple Vitamin (MULTIVITAMIN) capsule Take 1 capsule by mouth daily.    Marland Kitchen PROAIR HFA 108 (90 Base) MCG/ACT inhaler INL 2 PFS PO Q 4 TO 6 H PRN  0  . azelastine (ASTELIN) 0.1 % nasal spray U 1 SPR IEN BID PRN  5   No current facility-administered medications on file prior to visit.     Allergies  Allergen Reactions  . Evista [Raloxifene]     Respiratory infections, shortness of breath(not anaphylaxis), leg heaviness.  . Fosamax [Alendronate] Other (See Comments)    "Due to Esophagus problems"--narrow    Family History  Problem Relation Age of Onset  . Hypertension Mother   . Diabetes Father   .  Hypertension Father   . Heart disease Father   . Diabetes Sister   . Hypertension Sister   . Thyroid disease Sister   . Heart disease Sister   . Throat cancer Paternal Uncle        smoker  . Lung cancer Cousin        smoker  . Colon cancer Neg Hx   . Pancreatic cancer Neg Hx   . Stomach cancer Neg Hx   . Liver disease Neg Hx   . Colon polyps Neg Hx   . Esophageal cancer Neg Hx   . Rectal cancer Neg Hx     Social History   Socioeconomic History  . Marital status: Married    Spouse name: Not on file  . Number of children: 1  . Years of education: Not on file  . Highest education level: Not on file  Occupational History  . Occupation: retired  Scientific laboratory technician  . Financial resource strain: Not on file  . Food insecurity:    Worry: Not on file    Inability: Not on file  . Transportation needs:    Medical: Not  on file    Non-medical: Not on file  Tobacco Use  . Smoking status: Never Smoker  . Smokeless tobacco: Never Used  Substance and Sexual Activity  . Alcohol use: Yes    Alcohol/week: 4.2 oz    Types: 7 Glasses of wine per week    Comment: 7 glasses of wine per week  . Drug use: No  . Sexual activity: Yes    Partners: Male    Birth control/protection: Other-see comments, Post-menopausal    Comment: TVH--Still has ovaries  Lifestyle  . Physical activity:    Days per week: Not on file    Minutes per session: Not on file  . Stress: Not on file  Relationships  . Social connections:    Talks on phone: Not on file    Gets together: Not on file    Attends religious service: Not on file    Active member of club or organization: Not on file    Attends meetings of clubs or organizations: Not on file    Relationship status: Not on file  Other Topics Concern  . Not on file  Social History Narrative  . Not on file    Review of Systems - See HPI.  All other ROS are negative.  BP 110/70   Pulse 60   Temp 98.3 F (36.8 C) (Oral)   Resp 14   Ht 5\' 4"  (1.626 m)   Wt 148 lb (67.1 kg)   LMP 12/16/1993 (Approximate)   SpO2 98%   BMI 25.40 kg/m   Physical Exam  Constitutional: She is oriented to person, place, and time. She appears well-developed and well-nourished.  HENT:  Head: Normocephalic and atraumatic.  Right Ear: Tympanic membrane and external ear normal.  Left Ear: Tympanic membrane and external ear normal.  Nose: Mucosal edema and rhinorrhea present. Right sinus exhibits no maxillary sinus tenderness and no frontal sinus tenderness. Left sinus exhibits no maxillary sinus tenderness and no frontal sinus tenderness.  Mouth/Throat: Uvula is midline, oropharynx is clear and moist and mucous membranes are normal. No oropharyngeal exudate or posterior oropharyngeal erythema.  Eyes: Conjunctivae are normal.  Neck: Neck supple.  Cardiovascular: Normal rate, regular rhythm, normal  heart sounds and intact distal pulses.  Pulmonary/Chest: Effort normal and breath sounds normal. No stridor. No respiratory distress. She has no wheezes. She has  no rales. She exhibits no tenderness.  Lymphadenopathy:    She has no cervical adenopathy.  Neurological: She is alert and oriented to person, place, and time.  Psychiatric: She has a normal mood and affect.  Vitals reviewed.   Recent Results (from the past 2160 hour(s))  Lipid panel     Status: None   Collection Time: 05/05/18  8:48 AM  Result Value Ref Range   Cholesterol 158 0 - 200 mg/dL    Comment: ATP III Classification       Desirable:  < 200 mg/dL               Borderline High:  200 - 239 mg/dL          High:  > = 240 mg/dL   Triglycerides 101.0 0.0 - 149.0 mg/dL    Comment: Normal:  <150 mg/dLBorderline High:  150 - 199 mg/dL   HDL 61.80 >39.00 mg/dL   VLDL 20.2 0.0 - 40.0 mg/dL   LDL Cholesterol 76 0 - 99 mg/dL   Total CHOL/HDL Ratio 3     Comment:                Men          Women1/2 Average Risk     3.4          3.3Average Risk          5.0          4.42X Average Risk          9.6          7.13X Average Risk          15.0          11.0                       NonHDL 96.51     Comment: NOTE:  Non-HDL goal should be 30 mg/dL higher than patient's LDL goal (i.e. LDL goal of < 70 mg/dL, would have non-HDL goal of < 100 mg/dL)  Basic metabolic panel     Status: None   Collection Time: 05/05/18  8:48 AM  Result Value Ref Range   Sodium 141 135 - 145 mEq/L   Potassium 4.0 3.5 - 5.1 mEq/L   Chloride 104 96 - 112 mEq/L   CO2 28 19 - 32 mEq/L   Glucose, Bld 85 70 - 99 mg/dL   BUN 14 6 - 23 mg/dL   Creatinine, Ser 0.95 0.40 - 1.20 mg/dL   Calcium 9.4 8.4 - 10.5 mg/dL   GFR 61.42 >60.00 mL/min  Hepatic function panel     Status: None   Collection Time: 05/05/18  8:48 AM  Result Value Ref Range   Total Bilirubin 0.6 0.2 - 1.2 mg/dL   Bilirubin, Direct 0.1 0.0 - 0.3 mg/dL   Alkaline Phosphatase 60 39 - 117 U/L   AST 20  0 - 37 U/L   ALT 20 0 - 35 U/L   Total Protein 7.1 6.0 - 8.3 g/dL   Albumin 4.4 3.5 - 5.2 g/dL  CBC with Differential/Platelet     Status: Abnormal   Collection Time: 05/05/18  8:48 AM  Result Value Ref Range   WBC 3.8 (L) 4.0 - 10.5 K/uL   RBC 4.42 3.87 - 5.11 Mil/uL   Hemoglobin 13.3 12.0 - 15.0 g/dL   HCT 39.1 36.0 - 46.0 %   MCV 88.5 78.0 - 100.0 fl   MCHC  34.1 30.0 - 36.0 g/dL   RDW 13.0 11.5 - 15.5 %   Platelets 195.0 150.0 - 400.0 K/uL   Neutrophils Relative % 50.5 43.0 - 77.0 %   Lymphocytes Relative 38.0 12.0 - 46.0 %   Monocytes Relative 6.6 3.0 - 12.0 %   Eosinophils Relative 4.0 0.0 - 5.0 %   Basophils Relative 0.9 0.0 - 3.0 %   Neutro Abs 1.9 1.4 - 7.7 K/uL   Lymphs Abs 1.4 0.7 - 4.0 K/uL   Monocytes Absolute 0.2 0.1 - 1.0 K/uL   Eosinophils Absolute 0.2 0.0 - 0.7 K/uL   Basophils Absolute 0.0 0.0 - 0.1 K/uL    Assessment/Plan: 1. Chronic rhinitis Continue current regimen. Will add on Astelin nasal spray. Recommend saline rinses at night. Air purifier in the bedroom. If not improving, will need referral back to allergy and consideration to restart shots.  - azelastine (ASTELIN) 0.1 % nasal spray; 1 spray in each nostril twice daily.  Dispense: 30 mL; Refill: 5   Leeanne Rio, [place]

## 2018-07-12 ENCOUNTER — Encounter: Payer: Self-pay | Admitting: Family Medicine

## 2018-07-12 ENCOUNTER — Other Ambulatory Visit: Payer: Self-pay | Admitting: Physician Assistant

## 2018-07-12 DIAGNOSIS — J3089 Other allergic rhinitis: Secondary | ICD-10-CM

## 2018-07-17 ENCOUNTER — Encounter: Payer: Self-pay | Admitting: Family Medicine

## 2018-07-25 ENCOUNTER — Other Ambulatory Visit: Payer: Self-pay

## 2018-07-25 ENCOUNTER — Encounter (HOSPITAL_COMMUNITY): Payer: Self-pay | Admitting: Emergency Medicine

## 2018-07-25 ENCOUNTER — Ambulatory Visit (INDEPENDENT_AMBULATORY_CARE_PROVIDER_SITE_OTHER): Payer: Medicare Other

## 2018-07-25 ENCOUNTER — Ambulatory Visit (HOSPITAL_COMMUNITY)
Admission: EM | Admit: 2018-07-25 | Discharge: 2018-07-25 | Disposition: A | Discharge status: Home / Self Care | Payer: Medicare Other | Attending: Internal Medicine | Admitting: Internal Medicine

## 2018-07-25 DIAGNOSIS — M79671 Pain in right foot: Secondary | ICD-10-CM | POA: Diagnosis not present

## 2018-07-25 MED ORDER — NAPROXEN 375 MG PO TABS: 375.0000 mg | ORAL_TABLET | Freq: Two times a day (BID) | ORAL | 0 refills | Status: DC

## 2018-07-25 NOTE — ED Triage Notes (Signed)
Yesterday was walking in the home, took one step with right foot and no issue, very next step with right foot was excruciating pain.  Pain in heal of foot and radiating up back of ankle.  No known injury.  Patient has been on an antibiotic and had stopped statin, patient just recently resumed taking statin med

## 2018-07-25 NOTE — ED Provider Notes (Signed)
[facility]    CSN: [id number] Arrival date & time: 07/25/18  1236     History   Chief Complaint Chief Complaint  Patient presents with  . Foot Pain    HPI BERNARDETTE [name] is a 72 y.o. female  history of arthritis, osteopenia presenting today for evaluation of right foot/heel pain.  Patient states that yesterday while she was walking, all of a sudden when she took a specific step she started to have pain to the bottom of her heel that radiated up the back of her ankle.  Since she has had persistent pain especially with weightbearing.  She does have some mild pain at rest, but worsened significantly with walking.  She denies any abnormality with the step or twisting her ankle.  She is concerned that she recently finished a course of clarithromycin, she stopped her statin temporarily and recently restarted it back.  Denies any numbness or tingling.  HPI  Past Medical History:  Diagnosis Date  . Allergy   . Arthritis   . Asthma   . Cataract    left eye and stable   . Cervical disc disease    bulging  . Diverticulitis   . Diverticulosis   . GERD (gastroesophageal reflux disease)   . Hyperlipidemia   . Hypertension   . Osteopenia   . Osteoporosis   . PONV (postoperative nausea and vomiting)   . Status post dilation of esophageal narrowing     Patient Active Problem List   Diagnosis Date Noted  . Diverticulitis 07/09/2017  . Primary osteoarthritis of both hands 12/23/2016  . Primary osteoarthritis of both knees 12/23/2016  . Postural kyphosis of thoracic region 12/23/2016  . DJD (degenerative joint disease), cervical 12/23/2016  . History of gastroesophageal reflux (GERD) 12/23/2016  . History of fracture of left hip 12/23/2016  . Physical exam 11/04/2016  . Environmental and seasonal allergies 06/27/2016  . Esophageal dysphagia 06/03/2016  . Osteoporosis 08/03/2014  . Hyperlipidemia 11/06/2013  . Hypertension 11/06/2013  . Environmental allergies  11/06/2013    Past Surgical History:  Procedure Laterality Date  . ABDOMINAL HYSTERECTOMY  1995   TVH--still has ovaries  . ABDOMINAL SACROCOLPOPEXY  2007   Halban culdoplasty & Burch procedure--Dr. Quincy Simmonds  . APPENDECTOMY     Dr. Hassell Done  . arthroscopic shoulder Left    Dr. Durward Fortes, bone spur  . BLADDER SUSPENSION    . CERVICAL FUSION     C-4, C-5 fusion  . CHOLECYSTECTOMY     Dr. Georgina Quint  . ESOPHAGEAL DILATION    . HIP ARTHROPLASTY Left 11/07/2013   Procedure: ARTHROPLASTY BIPOLAR HIP;  Surgeon: Garald Balding, MD;  Location: [facility];  Service: Orthopedics;  Laterality: Left;  . KNEE ARTHROSCOPY Bilateral    Dr. Durward Fortes  . OTHER SURGICAL HISTORY  2011   posterior Colporrhaphy with Xenform biological graft--Dr. Quincy Simmonds  . RECTOCELE REPAIR  2011   posterior colporrhaphy with Xenform biological graft--Dr. Quincy Simmonds  . UPPER GASTROINTESTINAL ENDOSCOPY      OB History    Gravida  2   Para  1   Term  1   Preterm      AB  1   Living  1     SAB      TAB      Ectopic      Multiple      Live Births               Home Medications  Prior to Admission medications   Medication Sig Start Date End Date Taking? Authorizing Provider  Ascorbic Acid (VITAMIN C PO) Take by mouth.    [provider]  atorvastatin (LIPITOR) 10 MG tablet TAKE 1 TABLET BY MOUTH EVERY DAY 01/07/18   Midge Minium, MD  azelastine (ASTELIN) 0.1 % nasal spray 1 spray in each nostril twice daily. 07/06/18   Brunetta Jeans, PA-C  beta carotene w/minerals (OCUVITE) tablet Take 1 tablet by mouth daily.    [provider]  Calcium Citrate (CITRACAL PO) Take 2 tablets by mouth daily.    [provider]  cetirizine (ZYRTEC) 10 MG tablet Take 1 tablet (10 mg total) by mouth daily. 11/04/16   Midge Minium, MD  clobetasol ointment (TEMOVATE) 0.05 % Apply topically 2 (two) times daily as needed. 12/30/16   Nunzio Cobbs, MD  Coenzyme Q10  (COQ10) 200 MG CAPS Take 1 tablet by mouth daily.    [provider]  EPIPEN 2-PAK 0.3 MG/0.3ML SOAJ injection Inject 1 mg into the muscle as needed. Reported on 06/28/2016 06/29/13   [provider]  Ergocalciferol (VITAMIN D2) 400 units TABS Take 1 capsule by mouth daily.    [provider]  esomeprazole (NEXIUM) 20 MG capsule Take 20 mg by mouth daily at 12 noon.    [provider]  fish oil-omega-3 fatty acids 1000 MG capsule Take 2 g by mouth daily.    [provider]  fluorouracil (EFUDEX) 5 % cream APPLY ON THE SKIN NIGHTLY TO HANDS UP TO 3 WEEKS AS TOLERATED 02/03/18   [provider]  fluticasone (FLONASE) 50 MCG/ACT nasal spray Place 2 sprays into both nostrils daily.    [provider]  lactobacillus acidophilus (BACID) TABS tablet Take 2 tablets by mouth 2 (two) times a week.    [provider]  lisinopril (PRINIVIL,ZESTRIL) 20 MG tablet Take 1 tablet (20 mg total) by mouth daily. 04/13/18   Midge Minium, MD  montelukast (SINGULAIR) 10 MG tablet Take 1 tablet (10 mg total) by mouth at bedtime. 05/25/18   Midge Minium, MD  Multiple Vitamin (MULTIVITAMIN) capsule Take 1 capsule by mouth daily.    [provider]  naproxen (NAPROSYN) 375 MG tablet Take 1 tablet (375 mg total) by mouth 2 (two) times daily. 07/25/18   Isatou Agredano C, PA-C  PROAIR HFA 108 (90 Base) MCG/ACT inhaler INL 2 PFS PO Q 4 TO 6 H PRN 04/18/17   [provider]    Family History Family History  Problem Relation Age of Onset  . Hypertension Mother   . Diabetes Father   . Hypertension Father   . Heart disease Father   . Diabetes Sister   . Hypertension Sister   . Thyroid disease Sister   . Heart disease Sister   . Throat cancer Paternal Uncle        smoker  . Lung cancer Cousin        smoker  . Colon cancer Neg Hx   . Pancreatic cancer Neg Hx   . Stomach cancer Neg Hx   . Liver disease Neg Hx   . Colon  polyps Neg Hx   . Esophageal cancer Neg Hx   . Rectal cancer Neg Hx     Social History Social History   Tobacco Use  . Smoking status: Never Smoker  . Smokeless tobacco: Never Used  Substance Use Topics  . Alcohol use: Yes  Alcohol/week: 7.0 standard drinks    Types: 7 Glasses of wine per week    Comment: 7 glasses of wine per week  . Drug use: No     Allergies   Evista [raloxifene] and Fosamax [alendronate]   Review of Systems Review of Systems  Constitutional: Negative for fatigue and fever.  HENT: Negative for mouth sores.   Eyes: Negative for visual disturbance.  Respiratory: Negative for shortness of breath.   Cardiovascular: Negative for chest pain.  Gastrointestinal: Negative for abdominal pain, nausea and vomiting.  Genitourinary: Negative for genital sores.  Musculoskeletal: Positive for gait problem and myalgias. Negative for arthralgias and joint swelling.  Skin: Negative for color change, rash and wound.  Neurological: Negative for dizziness, weakness, light-headedness and headaches.     Physical Exam Triage Vital Signs ED Triage Vitals  Enc Vitals Group     BP 07/25/18 1357 129/71     Pulse Rate 07/25/18 1357 66     Resp 07/25/18 1357 18     Temp 07/25/18 1357 98.4 F (36.9 C)     Temp Source 07/25/18 1357 Oral     SpO2 07/25/18 1357 97 %     Weight --      Height --      Head Circumference --      Peak Flow --      Pain Score 07/25/18 1354 1     Pain Loc --      Pain Edu? --      Excl. in [place]? --    No data found.  Updated Vital Signs BP 129/71 (BP Location: Right Arm)   Pulse 66   Temp 98.4 F (36.9 C) (Oral)   Resp 18   LMP 12/16/1993 (Approximate)   SpO2 97%   Visual Acuity Right Eye Distance:   Left Eye Distance:   Bilateral Distance:    Right Eye Near:   Left Eye Near:    Bilateral Near:     Physical Exam  Constitutional: She is oriented to person, place, and time. She appears well-developed and well-nourished.  No  acute distress  HENT:  Head: Normocephalic and atraumatic.  Nose: Nose normal.  Eyes: Conjunctivae are normal.  Neck: Neck supple.  Cardiovascular: Normal rate.  Pulmonary/Chest: Effort normal. No respiratory distress.  Abdominal: She exhibits no distension.  Musculoskeletal: Normal range of motion.  Tenderness to palpation of plantar surface of heel, slight tenderness to palpation along the Achilles, but no deformity palpated.  Negative Thompson's bilaterally.  Walking with antalgia.  Nontender over medial lateral malleolus as well as first through fifth metatarsal.  Neurological: She is alert and oriented to person, place, and time.  Skin: Skin is warm and dry.  Psychiatric: She has a normal mood and affect.  Nursing note and vitals reviewed.    UC Treatments / Results  Labs (all labs ordered are listed, but only abnormal results are displayed) Labs Reviewed - No data to display  EKG None  Radiology Dg Foot Complete Right  Result Date: 07/25/2018 CLINICAL DATA:  Right heel pain starting yesterday. EXAM: RIGHT FOOT COMPLETE - 3+ VIEW COMPARISON:  May 06, 2018 FINDINGS: There is no evidence of fracture or dislocation. There is moderate plantar calcaneal spur unchanged compared to prior exam. Degenerative joint changes of the first metatarsal phalangeal joint are noted. IMPRESSION: No acute abnormality. Moderate plantar calcaneal spur unchanged compared to prior exam of May 06, 2018. Electronically Signed   By: Abelardo Diesel M.D.   On:  07/25/2018 14:45    Procedures Procedures (including critical care time)  Medications Ordered in UC Medications - No data to display  Initial Impression / Assessment and Plan / UC Course  I have reviewed the triage vital signs and the nursing notes.  Pertinent labs & imaging results that were available during my care of the patient were reviewed by me and considered in my medical decision making (see chart for details).     X-ray obtained,  showing spurs to heal, possible cause versus heel pad atrophy.  Will recommend conservative treatment with rest, ice and elevation.  Anti-inflammatories.  Follow-up with podiatry or Ortho.  Not appear to have Achilles rupture.  Clarithromycin and statins do not have this listed, she denies being on a fluoroquinolone.  Discussed that statins may cause joint and muscle aches and pains.Discussed strict return precautions. Patient verbalized understanding and is agreeable with plan.  Final Clinical Impressions(s) / UC Diagnoses   Final diagnoses:  Foot pain, right     Discharge Instructions     Please rest, ice and elevate foot May try rolling heel on golf ball or iced water bottle Please take naprosyn twice daily with food to help with pain and discomfort  Please follow-up with orthopedics if symptoms persisting beyond 1 to 2 weeks    ED Prescriptions    Medication Sig Dispense Auth. Provider   naproxen (NAPROSYN) 375 MG tablet Take 1 tablet (375 mg total) by mouth 2 (two) times daily. 20 tablet Radha Coggins, [place] C, PA-C     Controlled Substance Prescriptions [place] Controlled Substance Registry consulted? Not Applicable   Janith Lima, [place] 07/25/18 1533

## 2018-07-25 NOTE — Discharge Instructions (Signed)
Please rest, ice and elevate foot May try rolling heel on golf ball or iced water bottle Please take naprosyn twice daily with food to help with pain and discomfort  Please follow-up with orthopedics if symptoms persisting beyond 1 to 2 weeks

## 2018-08-07 ENCOUNTER — Other Ambulatory Visit: Payer: Self-pay | Admitting: Family Medicine

## 2018-08-13 ENCOUNTER — Ambulatory Visit: Payer: Medicare Other | Admitting: Allergy & Immunology

## 2018-09-04 ENCOUNTER — Other Ambulatory Visit: Payer: Self-pay | Admitting: Family Medicine

## 2018-09-15 ENCOUNTER — Encounter: Payer: Self-pay | Admitting: Family Medicine

## 2018-09-15 MED ORDER — ESOMEPRAZOLE MAGNESIUM 40 MG PO CPDR: 40.0000 mg | DELAYED_RELEASE_CAPSULE | Freq: Every day | ORAL | 3 refills | Status: DC

## 2018-09-21 ENCOUNTER — Ambulatory Visit
Admission: RE | Admit: 2018-09-21 | Discharge: 2018-09-21 | Disposition: A | Discharge status: Home / Self Care | Payer: Medicare Other | Source: Ambulatory Visit | Attending: Allergy and Immunology | Admitting: Allergy and Immunology

## 2018-09-21 ENCOUNTER — Other Ambulatory Visit: Payer: Self-pay | Admitting: Allergy and Immunology

## 2018-09-21 DIAGNOSIS — R05 Cough: Secondary | ICD-10-CM

## 2018-09-21 DIAGNOSIS — R059 Cough, unspecified: Secondary | ICD-10-CM

## 2018-09-25 ENCOUNTER — Encounter: Payer: Self-pay | Admitting: Family Medicine

## 2018-10-02 ENCOUNTER — Encounter: Payer: Self-pay | Admitting: Family Medicine

## 2018-10-02 MED ORDER — VALSARTAN 160 MG PO TABS: 160.0000 mg | ORAL_TABLET | Freq: Every day | ORAL | 3 refills | Status: DC

## 2018-10-15 ENCOUNTER — Encounter: Payer: Self-pay | Admitting: Family Medicine

## 2018-10-15 NOTE — Progress Notes (Signed)
72 y.o. G101P1011 Married Caucasian female here for annual exam.    Having issues with asthma and allergies. Feels better after stopping Singulair.   Feels her vagina has dropped.  Difficulty to void and doing double voiding.  No urinary incontinence. BMs are ok but feel slow to move through.  Not currently using stool softeners and these did help in the past.   With sexual activity, has some external irritation and mild bleeding.  Does use clobetasol as needed.  Bought a [employer].   PCP: Annye Asa, MD    Patient's last menstrual period was 12/16/1993 (approximate).       Sexually active: Yes.   female The current method of family planning is status post hysterectomy.    Exercising: No.  she does walk and does some biking Smoker:  no  Health Maintenance: Pap: 06-03-11 Neg History of abnormal Pap:  no MMG: 12-22-17 3D Neg/density C/BiRads1 Colonoscopy:  10-08-17 polyp/diverticulosis;next due 5 years BMD:  09-10-15  Result :Osteoporosis Lt.hip -2.8--declines treatment TDaP:  PCP Gardasil:   no HIV: never Hep C: 05-06-17 Neg Screening Labs:  Hb today: PCP    reports that she has never smoked. She has never used smokeless tobacco. She reports that she drinks about 1.0 standard drinks of alcohol per week. She reports that she does not use drugs.  Past Medical History:  Diagnosis Date  . Allergy   . Arthritis   . Asthma   . Cataract    left eye and stable   . Cervical disc disease    bulging  . Diverticulitis   . Diverticulosis   . GERD (gastroesophageal reflux disease)   . Hyperlipidemia   . Hypertension   . Osteopenia   . Osteoporosis   . PONV (postoperative nausea and vomiting)   . Status post dilation of esophageal narrowing     Past Surgical History:  Procedure Laterality Date  . ABDOMINAL HYSTERECTOMY  1995   TVH--still has ovaries  . ABDOMINAL SACROCOLPOPEXY  2007   Halban culdoplasty & Burch procedure--Dr. Quincy Simmonds  . APPENDECTOMY     Dr. Hassell Done  .  arthroscopic shoulder Left    Dr. Durward Fortes, bone spur  . BLADDER SUSPENSION    . CERVICAL FUSION     C-4, C-5 fusion  . CHOLECYSTECTOMY     Dr. Georgina Quint  . ESOPHAGEAL DILATION    . HIP ARTHROPLASTY Left 11/07/2013   Procedure: ARTHROPLASTY BIPOLAR HIP;  Surgeon: Garald Balding, MD;  Location: [facility];  Service: Orthopedics;  Laterality: Left;  . KNEE ARTHROSCOPY Bilateral    Dr. Durward Fortes  . OTHER SURGICAL HISTORY  2011   posterior Colporrhaphy with Xenform biological graft--Dr. Quincy Simmonds  . RECTOCELE REPAIR  2011   posterior colporrhaphy with Xenform biological graft--Dr. Quincy Simmonds  . UPPER GASTROINTESTINAL ENDOSCOPY      Current Outpatient Medications  Medication Sig Dispense Refill  . Ascorbic Acid (VITAMIN C PO) Take by mouth.    Marland Kitchen atorvastatin (LIPITOR) 10 MG tablet TAKE 1 TABLET BY MOUTH EVERY DAY 90 tablet 0  . azelastine (ASTELIN) 0.1 % nasal spray 1 spray in each nostril twice daily. 30 mL 5  . beta carotene w/minerals (OCUVITE) tablet Take 1 tablet by mouth daily.    . Calcium Citrate (CITRACAL PO) Take 2 tablets by mouth daily.    . cetirizine (ZYRTEC) 10 MG tablet Take 1 tablet (10 mg total) by mouth daily. 30 tablet 11  . clobetasol ointment (TEMOVATE) 0.05 % Apply topically 2 (two) times  daily as needed. 30 g 0  . Coenzyme Q10 (COQ10) 200 MG CAPS Take 1 tablet by mouth daily.    Marland Kitchen EPIPEN 2-PAK 0.3 MG/0.3ML SOAJ injection Inject 1 mg into the muscle as needed. Reported on 06/28/2016    . Ergocalciferol (VITAMIN D2) 400 units TABS Take 1 capsule by mouth daily.    Marland Kitchen esomeprazole (NEXIUM) 40 MG capsule Take 1 capsule (40 mg total) by mouth daily. 30 capsule 3  . fish oil-omega-3 fatty acids 1000 MG capsule Take 2 g by mouth daily.    . fluorouracil (EFUDEX) 5 % cream APPLY ON THE SKIN NIGHTLY TO HANDS UP TO 3 WEEKS AS TOLERATED  0  . fluticasone (FLONASE) 50 MCG/ACT nasal spray Place 2 sprays into both nostrils daily.    Marland Kitchen lactobacillus acidophilus (BACID) TABS tablet  Take 2 tablets by mouth 2 (two) times a week.    . Multiple Vitamin (MULTIVITAMIN) capsule Take 1 capsule by mouth daily.    Marland Kitchen PROAIR HFA 108 (90 Base) MCG/ACT inhaler INL 2 PFS PO Q 4 TO 6 H PRN  0  . valsartan (DIOVAN) 160 MG tablet Take 1 tablet (160 mg total) by mouth daily. 30 tablet 3   No current facility-administered medications for this visit.     Family History  Problem Relation Age of Onset  . Hypertension Mother   . Diabetes Father   . Hypertension Father   . Heart disease Father   . Diabetes Sister   . Hypertension Sister   . Schizophrenia Sister   . Thyroid disease Sister   . Heart disease Sister   . Throat cancer Paternal Uncle        smoker  . Lung cancer Cousin        smoker  . Colon cancer Neg Hx   . Pancreatic cancer Neg Hx   . Stomach cancer Neg Hx   . Liver disease Neg Hx   . Colon polyps Neg Hx   . Esophageal cancer Neg Hx   . Rectal cancer Neg Hx     Review of Systems  HENT: Positive for sinus pressure.   Skin:       Hair loss    Exam:   BP 104/60 (BP Location: Right Arm, Patient Position: Sitting, Cuff Size: Normal)   Pulse 60   Resp 16   Ht 5\' 3"  (1.6 m)   Wt 149 lb 9.6 oz (67.9 kg)   LMP 12/16/1993 (Approximate)   BMI 26.50 kg/m     General appearance: alert, cooperative and appears stated age Head: Normocephalic, without obvious abnormality, atraumatic Neck: no adenopathy, supple, symmetrical, trachea midline and thyroid normal to inspection and palpation Lungs: clear to auscultation bilaterally Breasts: normal appearance, no masses or tenderness, No nipple retraction or dimpling, No nipple discharge or bleeding, No axillary or supraclavicular adenopathy Heart: regular rate and rhythm Abdomen: soft, non-tender; no masses, no organomegaly Extremities: extremities normal, atraumatic, no cyanosis or edema Skin: Skin color, texture, turgor normal. No rashes or lesions Lymph nodes: Cervical, supraclavicular, and axillary nodes normal. No  abnormal inguinal nodes palpated Neurologic: Grossly normal  Pelvic: External genitalia:  no lesions              Urethra:  normal appearing urethra with no masses, tenderness or lesions              Bartholins and Skenes: normal                 Vagina: normal  appearing vagina with normal color and discharge, no lesions.  First to second degree cystocele.  Good apical and posterior vaginal wall support.               Cervix:  Absent.              Pap taken: No. Bimanual Exam:  Uterus:  absent              Adnexa: no mass, fullness, tenderness              Rectal exam: Yes.  .  Confirms.              Anus:  normal sphincter tone, no lesions  Chaperone was present for exam.  Assessment:   Well woman visit with normal exam. Status post total vaginal hysterectomy.  Ovaries remain. Status post sacrocolpopexy, Halban culdoplasty and Burch.  Status post rectocele repair with biologic graft.  Cystocele. Osteoporosis. Has declined tx.  Intolerant of Evista.  Lichen sclerosus. Controlled with Clobetasol.  Plan: Mammogram screening. Recommended self breast awareness. Pap and HR HPV as above. Guidelines for Calcium, Vitamin D, regular exercise program including cardiovascular and weight bearing exercise. Refill of Clobetasol.  Declines BMD and tx of osteoporosis.  Will refer to Ileana Roup for pelvic floor PT.  Follow up annually and prn.   After visit summary provided.

## 2018-10-16 ENCOUNTER — Encounter: Payer: Self-pay | Admitting: Obstetrics and Gynecology

## 2018-10-16 ENCOUNTER — Other Ambulatory Visit: Payer: Self-pay

## 2018-10-16 ENCOUNTER — Ambulatory Visit (INDEPENDENT_AMBULATORY_CARE_PROVIDER_SITE_OTHER): Payer: Medicare Other | Admitting: Obstetrics and Gynecology

## 2018-10-16 VITALS — BP 104/60 | HR 60 | Resp 16 | Ht 63.0 in | Wt 149.6 lb

## 2018-10-16 DIAGNOSIS — Z01419 Encounter for gynecological examination (general) (routine) without abnormal findings: Secondary | ICD-10-CM

## 2018-10-16 DIAGNOSIS — N8110 Cystocele, unspecified: Secondary | ICD-10-CM

## 2018-10-16 DIAGNOSIS — L900 Lichen sclerosus et atrophicus: Secondary | ICD-10-CM | POA: Diagnosis not present

## 2018-10-16 MED ORDER — CLOBETASOL PROPIONATE 0.05 % EX OINT: TOPICAL_OINTMENT | Freq: Two times a day (BID) | CUTANEOUS | 0 refills | Status: DC | PRN

## 2018-10-16 NOTE — Patient Instructions (Signed)

## 2018-11-03 ENCOUNTER — Other Ambulatory Visit: Payer: Self-pay | Admitting: Family Medicine

## 2018-11-09 ENCOUNTER — Other Ambulatory Visit: Payer: Self-pay | Admitting: Obstetrics and Gynecology

## 2018-11-09 DIAGNOSIS — Z1231 Encounter for screening mammogram for malignant neoplasm of breast: Secondary | ICD-10-CM

## 2018-11-16 ENCOUNTER — Encounter (HOSPITAL_COMMUNITY): Payer: Self-pay | Admitting: *Deleted

## 2018-11-16 ENCOUNTER — Emergency Department (HOSPITAL_COMMUNITY)
Admission: EM | Admit: 2018-11-16 | Discharge: 2018-11-17 | Disposition: A | Discharge status: Home / Self Care | Payer: Medicare Other | Attending: Emergency Medicine | Admitting: Emergency Medicine

## 2018-11-16 ENCOUNTER — Other Ambulatory Visit: Payer: Self-pay

## 2018-11-16 DIAGNOSIS — Z79899 Other long term (current) drug therapy: Secondary | ICD-10-CM | POA: Insufficient documentation

## 2018-11-16 DIAGNOSIS — R55 Syncope and collapse: Secondary | ICD-10-CM | POA: Diagnosis not present

## 2018-11-16 DIAGNOSIS — J45909 Unspecified asthma, uncomplicated: Secondary | ICD-10-CM | POA: Insufficient documentation

## 2018-11-16 DIAGNOSIS — I10 Essential (primary) hypertension: Secondary | ICD-10-CM | POA: Diagnosis not present

## 2018-11-16 LAB — CBC WITH DIFFERENTIAL/PLATELET
Abs Immature Granulocytes: 0.02 10*3/uL (ref 0.00–0.07)
BASOS ABS: 0 10*3/uL (ref 0.0–0.1)
Basophils Relative: 0 %
EOS ABS: 0.1 10*3/uL (ref 0.0–0.5)
EOS PCT: 1 %
HEMATOCRIT: 35.8 % — AB (ref 36.0–46.0)
Hemoglobin: 12 g/dL (ref 12.0–15.0)
Immature Granulocytes: 0 %
LYMPHS ABS: 1.1 10*3/uL (ref 0.7–4.0)
Lymphocytes Relative: 15 %
MCH: 30.2 pg (ref 26.0–34.0)
MCHC: 33.5 g/dL (ref 30.0–36.0)
MCV: 89.9 fL (ref 80.0–100.0)
Monocytes Absolute: 0.4 10*3/uL (ref 0.1–1.0)
Monocytes Relative: 6 %
NRBC: 0 % (ref 0.0–0.2)
Neutro Abs: 5.9 10*3/uL (ref 1.7–7.7)
Neutrophils Relative %: 78 %
Platelets: 198 10*3/uL (ref 150–400)
RBC: 3.98 MIL/uL (ref 3.87–5.11)
RDW: 12.9 % (ref 11.5–15.5)
WBC: 7.5 10*3/uL (ref 4.0–10.5)

## 2018-11-16 LAB — I-STAT TROPONIN, ED: Troponin i, poc: 0 ng/mL (ref 0.00–0.08)

## 2018-11-16 MED ORDER — PROMETHAZINE HCL 25 MG/ML IJ SOLN
6.2500 mg | Freq: Once | INTRAMUSCULAR | Status: AC
  Administered 2018-11-16: 6.25 mg via INTRAVENOUS
  Filled 2018-11-16: qty 1

## 2018-11-16 MED ORDER — SODIUM CHLORIDE 0.9 % IV BOLUS
1000.0000 mL | Freq: Once | INTRAVENOUS | Status: AC
  Administered 2018-11-16: 1000 mL via INTRAVENOUS

## 2018-11-16 NOTE — ED Triage Notes (Signed)
Pt brought in by [facility] for c/o syncopal episode after taking a shower; pt has been vomiting en route by ems; pt given zofran 4mg  IV by ems and NS bolus 250 ml en route

## 2018-11-17 ENCOUNTER — Other Ambulatory Visit: Payer: Self-pay

## 2018-11-17 ENCOUNTER — Encounter: Payer: Self-pay | Admitting: Family Medicine

## 2018-11-17 ENCOUNTER — Ambulatory Visit (INDEPENDENT_AMBULATORY_CARE_PROVIDER_SITE_OTHER): Payer: Medicare Other | Admitting: Family Medicine

## 2018-11-17 VITALS — BP 102/80 | HR 88 | Temp 98.8°F | Resp 17 | Ht 63.0 in | Wt 152.1 lb

## 2018-11-17 DIAGNOSIS — R55 Syncope and collapse: Secondary | ICD-10-CM

## 2018-11-17 DIAGNOSIS — I10 Essential (primary) hypertension: Secondary | ICD-10-CM

## 2018-11-17 DIAGNOSIS — M810 Age-related osteoporosis without current pathological fracture: Secondary | ICD-10-CM | POA: Diagnosis not present

## 2018-11-17 DIAGNOSIS — Z0000 Encounter for general adult medical examination without abnormal findings: Secondary | ICD-10-CM

## 2018-11-17 DIAGNOSIS — T2122XA Burn of second degree of abdominal wall, initial encounter: Secondary | ICD-10-CM

## 2018-11-17 DIAGNOSIS — Z0001 Encounter for general adult medical examination with abnormal findings: Secondary | ICD-10-CM | POA: Diagnosis not present

## 2018-11-17 LAB — URINALYSIS, ROUTINE W REFLEX MICROSCOPIC
BILIRUBIN URINE: NEGATIVE
Bacteria, UA: NONE SEEN
Glucose, UA: NEGATIVE mg/dL
Hgb urine dipstick: NEGATIVE
Ketones, ur: 5 mg/dL — AB
Nitrite: NEGATIVE
Protein, ur: NEGATIVE mg/dL
Specific Gravity, Urine: 1.018 (ref 1.005–1.030)
pH: 5 (ref 5.0–8.0)

## 2018-11-17 LAB — BASIC METABOLIC PANEL
Anion gap: 7 (ref 5–15)
BUN: 19 mg/dL (ref 8–23)
CO2: 26 mmol/L (ref 22–32)
Calcium: 9.1 mg/dL (ref 8.9–10.3)
Chloride: 108 mmol/L (ref 98–111)
Creatinine, Ser: 0.93 mg/dL (ref 0.44–1.00)
GFR calc Af Amer: >60 mL/min (ref >60)
Glucose, Bld: 126 mg/dL — ABNORMAL HIGH (ref 70–99)
POTASSIUM: 3.7 mmol/L (ref 3.5–5.1)
SODIUM: 141 mmol/L (ref 135–145)

## 2018-11-17 LAB — TROPONIN I: Troponin I: <0.03 ng/mL (ref <0.03)

## 2018-11-17 MED ORDER — ONDANSETRON 4 MG PO TBDP: 4.0000 mg | ORAL_TABLET | Freq: Three times a day (TID) | ORAL | 0 refills | Status: DC | PRN

## 2018-11-17 MED ORDER — MECLIZINE HCL 12.5 MG PO TABS
25.0000 mg | ORAL_TABLET | Freq: Once | ORAL | Status: AC
  Administered 2018-11-17: 25 mg via ORAL
  Filled 2018-11-17: qty 2

## 2018-11-17 MED ORDER — MECLIZINE HCL 12.5 MG PO TABS: 12.5000 mg | ORAL_TABLET | Freq: Three times a day (TID) | ORAL | 0 refills | Status: DC | PRN

## 2018-11-17 NOTE — Assessment & Plan Note (Signed)
Chronic problem.  Upon reviewing last few BPs, they have all been consistently on the low side.  Given her vasovagal episode last night, will decrease Valsartan to 1/2 tab daily and monitor BP closely.  Pt expressed understanding and is in agreement w/ plan.

## 2018-11-17 NOTE — ED Provider Notes (Signed)
[facility] EMERGENCY DEPARTMENT Provider Note   CSN: [id number] Arrival date & time: 11/16/18  2218     History   Chief Complaint Chief Complaint  Patient presents with  . Loss of Consciousness    HPI Sarah [name] is a 72 y.o. female.  HPI  This is a 71 year old female with a history of asthma, allergies, hypertension, hyperlipidemia who presents after syncopal episode.  Patient reports that she got out of the hot bathtub.  When she stood up she felt dizzy.  She sat on the toilet.  Her husband was beside her when "everything went black."  He reports that she lost consciousness for "seconds."  He laid her on the floor and she came to.  No seizure activity noted.  No history of syncope.  Patient does report some persistent dizziness.  She has had some nonbilious, nonbloody emesis and some nausea.  She states at times the dizziness is room spinning when she closes her eyes.  No history of vertigo.  No notable strokelike symptoms including weakness, numbness, speech difficulty, facial droop.  Patient was given Zofran but continues to have some nausea.  She denies chest pain, shortness of breath, abdominal pain.  No recent fevers or illnesses.  Past Medical History:  Diagnosis Date  . Allergy   . Arthritis   . Asthma   . Cataract    left eye and stable   . Cervical disc disease    bulging  . Diverticulitis   . Diverticulosis   . GERD (gastroesophageal reflux disease)   . Hyperlipidemia   . Hypertension   . Osteopenia   . Osteoporosis   . PONV (postoperative nausea and vomiting)   . Status post dilation of esophageal narrowing     Patient Active Problem List   Diagnosis Date Noted  . Diverticulitis 07/09/2017  . Primary osteoarthritis of both hands 12/23/2016  . Primary osteoarthritis of both knees 12/23/2016  . Postural kyphosis of thoracic region 12/23/2016  . DJD (degenerative joint disease), cervical 12/23/2016  . History of gastroesophageal reflux (GERD) 12/23/2016   . History of fracture of left hip 12/23/2016  . Physical exam 11/04/2016  . Environmental and seasonal allergies 06/27/2016  . Esophageal dysphagia 06/03/2016  . Osteoporosis 08/03/2014  . Hyperlipidemia 11/06/2013  . Hypertension 11/06/2013  . Environmental allergies 11/06/2013    Past Surgical History:  Procedure Laterality Date  . ABDOMINAL HYSTERECTOMY  1995   TVH--still has ovaries  . ABDOMINAL SACROCOLPOPEXY  2007   Halban culdoplasty & Burch procedure--Dr. Quincy Simmonds  . APPENDECTOMY     Dr. Hassell Done  . arthroscopic shoulder Left    Dr. Durward Fortes, bone spur  . BLADDER SUSPENSION    . CERVICAL FUSION     C-4, C-5 fusion  . CHOLECYSTECTOMY     Dr. Georgina Quint  . ESOPHAGEAL DILATION    . HIP ARTHROPLASTY Left 11/07/2013   Procedure: ARTHROPLASTY BIPOLAR HIP;  Surgeon: Garald Balding, MD;  Location: [place];  Service: Orthopedics;  Laterality: Left;  . KNEE ARTHROSCOPY Bilateral    Dr. Durward Fortes  . OTHER SURGICAL HISTORY  2011   posterior Colporrhaphy with Xenform biological graft--Dr. Quincy Simmonds  . RECTOCELE REPAIR  2011   posterior colporrhaphy with Xenform biological graft--Dr. Quincy Simmonds  . UPPER GASTROINTESTINAL ENDOSCOPY       OB History    Gravida  2   Para  1   Term  1   Preterm      AB  1   Living  1     SAB      TAB      Ectopic      Multiple      Live Births               Home Medications    Prior to Admission medications   Medication Sig Start Date End Date Taking? Authorizing Provider  atorvastatin (LIPITOR) 10 MG tablet TAKE 1 TABLET BY MOUTH EVERY DAY Patient taking differently: Take 10 mg by mouth every evening.  11/03/18  Yes Midge Minium, MD  cetirizine (ZYRTEC) 10 MG tablet Take 1 tablet (10 mg total) by mouth daily. 11/04/16  Yes Midge Minium, MD  fluticasone (FLONASE) 50 MCG/ACT nasal spray Place 2 sprays into both nostrils daily.   Yes [provider]  valsartan (DIOVAN) 160 MG tablet Take 1 tablet (160 mg  total) by mouth daily. Patient taking differently: Take 160 mg by mouth every morning.  10/02/18  Yes Midge Minium, MD  Ascorbic Acid (VITAMIN C PO) Take by mouth.    [provider]  azelastine (ASTELIN) 0.1 % nasal spray 1 spray in each nostril twice daily. 07/06/18   Brunetta Jeans, PA-C  beta carotene w/minerals (OCUVITE) tablet Take 1 tablet by mouth daily.    [provider]  Calcium Citrate (CITRACAL PO) Take 2 tablets by mouth daily.    [provider]  clobetasol ointment (TEMOVATE) 0.05 % Apply topically 2 (two) times daily as needed. Use for 2 weeks at time as needed. 10/16/18   Nunzio Cobbs, MD  Coenzyme Q10 (COQ10) 200 MG CAPS Take 1 tablet by mouth daily.    [provider]  EPIPEN 2-PAK 0.3 MG/0.3ML SOAJ injection Inject 1 mg into the muscle as needed. Reported on 06/28/2016 06/29/13   [provider]  Ergocalciferol (VITAMIN D2) 400 units TABS Take 1 capsule by mouth daily.    [provider]  esomeprazole (NEXIUM) 40 MG capsule Take 1 capsule (40 mg total) by mouth daily. 09/15/18   Midge Minium, MD  fish oil-omega-3 fatty acids 1000 MG capsule Take 2 g by mouth daily.    [provider]  fluorouracil (EFUDEX) 5 % cream APPLY ON THE SKIN NIGHTLY TO HANDS UP TO 3 WEEKS AS TOLERATED 02/03/18   [provider]  lactobacillus acidophilus (BACID) TABS tablet Take 2 tablets by mouth 2 (two) times a week.    [provider]  meclizine (ANTIVERT) 12.5 MG tablet Take 1 tablet (12.5 mg total) by mouth 3 (three) times daily as needed for dizziness. 11/17/18   Orlandus Borowski, Barbette Hair, MD  Multiple Vitamin (MULTIVITAMIN) capsule Take 1 capsule by mouth daily.    [provider]  ondansetron (ZOFRAN ODT) 4 MG disintegrating tablet Take 1 tablet (4 mg total) by mouth every 8 (eight) hours as needed for nausea or vomiting. 11/17/18   Chrishon Martino, Barbette Hair, MD  PROAIR HFA 108 [phone] Base) MCG/ACT  inhaler INL 2 PFS PO Q 4 TO 6 H PRN 04/18/17   [provider]    Family History Family History  Problem Relation Age of Onset  . Hypertension Mother   . Diabetes Father   . Hypertension Father   . Heart disease Father   . Diabetes Sister   . Hypertension Sister   . Schizophrenia Sister   . Thyroid disease Sister   . Heart disease Sister   . Throat cancer Paternal Uncle  smoker  . Lung cancer Cousin        smoker  . Colon cancer Neg Hx   . Pancreatic cancer Neg Hx   . Stomach cancer Neg Hx   . Liver disease Neg Hx   . Colon polyps Neg Hx   . Esophageal cancer Neg Hx   . Rectal cancer Neg Hx     Social History Social History   Tobacco Use  . Smoking status: Never Smoker  . Smokeless tobacco: Never Used  Substance Use Topics  . Alcohol use: Yes    Alcohol/week: 1.0 standard drinks    Types: 1 Glasses of wine per week    Comment: 1 glass of wine per week  . Drug use: No     Allergies   Evista [raloxifene]; Fosamax [alendronate]; and Singulair [montelukast]   Review of Systems Review of Systems  Constitutional: Negative for fever.  Respiratory: Negative for shortness of breath.   Cardiovascular: Negative for chest pain.  Gastrointestinal: Positive for nausea and vomiting. Negative for abdominal pain, constipation and diarrhea.  Genitourinary: Negative for dysuria.  Neurological: Positive for dizziness and syncope. Negative for seizures, weakness and headaches.  All other systems reviewed and are negative.    Physical Exam Updated Vital Signs BP 136/70   Pulse 70   Temp 97.7 F (36.5 C) (Oral)   Resp 18   Ht 1.626 m (5\' 4" )   Wt 67.1 kg   LMP 12/16/1993 (Approximate)   SpO2 97%   BMI 25.40 kg/m   Physical Exam  Constitutional: She is oriented to person, place, and time. She appears well-developed and well-nourished.  HENT:  Head: Normocephalic and atraumatic.  Eyes: Pupils are equal, round, and reactive to light. EOM are normal.    No nystagmus noted  Neck: Neck supple.  Cardiovascular: Normal rate, regular rhythm and normal heart sounds.  No murmur heard. Pulmonary/Chest: Effort normal and breath sounds normal. No respiratory distress. She has no wheezes.  Abdominal: Soft. Bowel sounds are normal. There is no tenderness.  Musculoskeletal: She exhibits no edema.  Neurological: She is alert and oriented to person, place, and time.  Cranial nerves II through XII intact, 5 out of 5 strength in all 4 extremities, no dysmetria to finger-nose-finger  Skin: Skin is warm and dry.  Psychiatric: She has a normal mood and affect.  Nursing note and vitals reviewed.    ED Treatments / Results  Labs (all labs ordered are listed, but only abnormal results are displayed) Labs Reviewed  CBC WITH DIFFERENTIAL/PLATELET - Abnormal; Notable for the following components:      Result Value   HCT 35.8 (*)    All other components within normal limits  BASIC METABOLIC PANEL - Abnormal; Notable for the following components:   Glucose, Bld 126 (*)    All other components within normal limits  URINALYSIS, ROUTINE W REFLEX MICROSCOPIC - Abnormal; Notable for the following components:   APPearance HAZY (*)    Ketones, ur 5 (*)    Leukocytes, UA LARGE (*)    All other components within normal limits  TROPONIN I  I-STAT TROPONIN, ED    EKG EKG Interpretation  Date/Time:  Monday November 16 2018 22:22:54 EST Ventricular Rate:  61 PR Interval:    QRS Duration: 91 QT Interval:  417 QTC Calculation: 420 R Axis:   58 Text Interpretation:  Sinus rhythm Prolonged PR interval Consider RVH or posterior infarct Since last tracing PR interval prolonged. Confirmed by Noemi Chapel [phone]) on  11/16/2018 10:43:23 PM   Radiology No results found.  Procedures Procedures (including critical care time)  Medications Ordered in ED Medications  promethazine (PHENERGAN) injection 6.25 mg (6.25 mg Intravenous Given 11/16/18 2333)  sodium  chloride 0.9 % bolus 1,000 mL (1,000 mLs Intravenous New Bag/Given 11/16/18 2333)  meclizine (ANTIVERT) tablet 25 mg (25 mg Oral Given 11/17/18 0049)     Initial Impression / Assessment and Plan / ED Course  I have reviewed the triage vital signs and the nursing notes.  Pertinent labs & imaging results that were available during my care of the patient were reviewed by me and considered in my medical decision making (see chart for details).     Patient presents after an episode of syncope.  She is overall nontoxic and vital signs are reassuring.  She is neurologically intact.  She does at times reports some room spinning dizziness.  She did have prodrome prior to syncope and also stood up quickly after being in a hot bath.  History is most suggestive of a vasovagal episode.  She denies chest pain or shortness of breath.  No other red flags.  EKG shows no evidence of acute ischemia or arrhythmia.  Troponin is negative.  Lab work is reassuring.  Patient is not significantly anemic.  Patient improved after Zofran and meclizine.  Suspect vasovagal syncope with or without an element of vertigo.  Given that she is neurologically intact, doubt acute stroke.  She is ambulatory without difficulty.  Recommend supportive measures and close follow-up with primary physician.  Patient and husband state understanding.  After history, exam, and medical workup I feel the patient has been appropriately medically screened and is safe for discharge home. Pertinent diagnoses were discussed with the patient. Patient was given return precautions.   Final Clinical Impressions(s) / ED Diagnoses   Final diagnoses:  Vasovagal syncope    ED Discharge Orders         Ordered    ondansetron (ZOFRAN ODT) 4 MG disintegrating tablet  Every 8 hours PRN     11/17/18 0144    meclizine (ANTIVERT) 12.5 MG tablet  3 times daily PRN     11/17/18 0144           Merryl Hacker, MD 11/17/18 [phone]

## 2018-11-17 NOTE — Discharge Instructions (Addendum)
You were seen today after an episode of loss of consciousness.  This was likely vasovagal and related to standing up too quickly after a warm bath.  There were some elements of your history that were suggestive of vertigo.  Take medications as prescribed.  Follow-up closely with your primary doctor.  Avoid standing up too quickly and make sure to stay hydrated.  If you develop worsening symptoms, strokelike symptoms, any new or worsening symptoms you should be reevaluated.

## 2018-11-17 NOTE — Assessment & Plan Note (Signed)
Pt's PE WNL w/ exception of abdominal wall burn.  UTD on GYN, colonoscopy, immunizations.  Check labs.  Anticipatory guidance provided.

## 2018-11-17 NOTE — Progress Notes (Signed)
   Subjective:    Patient ID: Sarah [name], female    DOB: 1946/07/05, 72 y.o.   MRN: [id number]  HPI CPE- UTD on mammo, colonoscopy, immunizations.  Vasovagal episode- pt passed out after getting out of the tub last night.  Was evaluated in ER and was determined to have had a vagal episode.  Had poor water intake yesterday and did not have a full meal.    Review of Systems Patient reports no vision/ hearing changes, adenopathy,fever, weight change,  persistant/recurrent hoarseness , swallowing issues, chest pain, palpitations, edema, persistant/recurrent cough, hemoptysis, dyspnea (rest/exertional/paroxysmal nocturnal), gastrointestinal bleeding (melena, rectal bleeding), abdominal pain, significant heartburn, bowel changes, GU symptoms (dysuria, hematuria, incontinence), Gyn symptoms (abnormal  bleeding, pain), focal weakness, memory loss, numbness & tingling, hair/nail changes, abnormal bruising or bleeding, anxiety, or depression.   Burn on abdomen after spilling tea this AM    Objective:   Physical Exam General Appearance:    Alert, cooperative, no distress, appears stated age  Head:    Normocephalic, without obvious abnormality, atraumatic  Eyes:    PERRL, conjunctiva/corneas clear, EOM's intact, fundi    benign, both eyes  Ears:    Normal TM's and external ear canals, both ears  Nose:   Nares normal, septum midline, mucosa normal, no drainage    or sinus tenderness  Throat:   Lips, mucosa, and tongue normal; teeth and gums normal  Neck:   Supple, symmetrical, trachea midline, no adenopathy;    Thyroid: no enlargement/tenderness/nodules  Back:     Symmetric, no curvature, ROM normal, no CVA tenderness  Lungs:     Clear to auscultation bilaterally, respirations unlabored  Chest Wall:    No tenderness or deformity   Heart:    Regular rate and rhythm, S1 and S2 normal, no murmur, rub   or gallop  Breast Exam:    Deferred to GYN  Abdomen:     Soft, non-tender, bowel sounds active  all four quadrants,    no masses, no organomegaly  Genitalia:    Deferred to GYN  Rectal:    Extremities:   Extremities normal, atraumatic, no cyanosis or edema  Pulses:   2+ and symmetric all extremities  Skin:   1.5" x 0.5" superficial burn w/ small central blister on abdominal wall  Lymph nodes:   Cervical, supraclavicular, and axillary nodes normal  Neurologic:   CNII-XII intact, normal strength, sensation and reflexes    throughout          Assessment & Plan:  Vasovagal episode- ER notes and labs reviewed.  Agree w/ dx.  Discussed need for adequate hydration and given recent low BP readings will decrease Valsartan to 1/2 tab daily to hopefully prevent future episodes.  Pt expressed understanding and is in agreement w/ plan.   Burn- new.  Pt spilled chai tea on abdomen this AM.  Mildly tender to palpation.  Small central blister.  Pt to apply thin layer of abx ointment twice daily and monitor for signs of infxn.  Pt expressed understanding and is in agreement w/ plan.

## 2018-11-17 NOTE — Assessment & Plan Note (Signed)
Ongoing issue for pt.  Check Vit D and replete prn.

## 2018-11-17 NOTE — Patient Instructions (Signed)
Follow up in 4-6 weeks to recheck BP (since we're changing meds) We'll notify you of your lab results and make any changes if needed DECREASE your Valsartan to 1/2 tab daily APPLY antibiotic ointment to the burn twice daily until scabbed over- monitor for signs of infection Call with any questions or concerns Happy Holidays!!!

## 2018-11-18 NOTE — Progress Notes (Addendum)
Subjective:   Sarah [name] is a 72 y.o. female who presents for Medicare Annual (Subsequent) preventive examination.  Review of Systems:  No ROS.  Medicare Wellness Visit. Additional risk factors are reflected in the social history.  Cardiac Risk Factors include: advanced age (>22men, >50 women);dyslipidemia;hypertension Sleep patterns: Sleeps 6 hours.  Home Safety/Smoke Alarms: Feels safe in home. Smoke alarms in place.  Living environment; residence and Firearm Safety: Lives with husband in 1 story home.  Seat Belt Safety/Bike Helmet: Wears seat belt.   Female:   [id number]       Mammo-12/22/2017, BI-RADS CATEGORY  1: Negative. Scheduled 12/23/2018.       Dexa scan-09/11/2015, Osteoporosis. Declines testing, discussed with GYN.               CCS-Colonoscopy 10/08/2017, polyp. Recall 5 years.      Objective:     Vitals: BP 138/70 (BP Location: Left Arm, Patient Position: Sitting, Cuff Size: Normal)   Pulse (!) 52   Ht 5\' 3"  (1.6 m)   Wt 150 lb (68 kg)   LMP 12/16/1993 (Approximate)   SpO2 97%   BMI 26.57 kg/m   Body mass index is 26.57 kg/m.  Advanced Directives 11/19/2018 11/16/2018 11/12/2017 10/08/2017 06/28/2016 05/03/2015 04/26/2015  Does Patient Have a Medical Advance Directive? Yes Yes Yes Yes Yes Yes Yes  Type of Paramedic of [place];Living will [place];Living will [place];Living will [place];Living will - Living will -  Does patient want to make changes to medical advance directive? - No - Patient declined - - - - -  Copy of [place] in Chart? No - copy requested - No - copy requested - No - copy requested No - copy requested -    Tobacco Social History   Tobacco Use  Smoking Status Never Smoker  Smokeless Tobacco Never Used     Counseling given: Not Answered   Past Medical History:  Diagnosis Date  . Allergy   . Arthritis   . Asthma   . Cataract     left eye and stable   . Cervical disc disease    bulging  . Diverticulitis   . Diverticulosis   . GERD (gastroesophageal reflux disease)   . Hyperlipidemia   . Hypertension   . Osteopenia   . Osteoporosis   . PONV (postoperative nausea and vomiting)   . Status post dilation of esophageal narrowing    Past Surgical History:  Procedure Laterality Date  . ABDOMINAL HYSTERECTOMY  1995   TVH--still has ovaries  . ABDOMINAL SACROCOLPOPEXY  2007   Halban culdoplasty & Burch procedure--Dr. Quincy Simmonds  . APPENDECTOMY     Dr. Hassell Done  . arthroscopic shoulder Left    Dr. Durward Fortes, bone spur  . BLADDER SUSPENSION    . CERVICAL FUSION     C-4, C-5 fusion  . CHOLECYSTECTOMY     Dr. Georgina Quint  . ESOPHAGEAL DILATION    . HIP ARTHROPLASTY Left 11/07/2013   Procedure: ARTHROPLASTY BIPOLAR HIP;  Surgeon: Garald Balding, MD;  Location: [facility];  Service: Orthopedics;  Laterality: Left;  . KNEE ARTHROSCOPY Bilateral    Dr. Durward Fortes  . OTHER SURGICAL HISTORY  2011   posterior Colporrhaphy with Xenform biological graft--Dr. Quincy Simmonds  . RECTOCELE REPAIR  2011   posterior colporrhaphy with Xenform biological graft--Dr. Quincy Simmonds  . UPPER GASTROINTESTINAL ENDOSCOPY     Family History  Problem Relation Age of  Onset  . Hypertension Mother   . Diabetes Father   . Hypertension Father   . Heart disease Father   . Diabetes Sister   . Hypertension Sister   . Schizophrenia Sister   . Thyroid disease Sister   . Heart disease Sister   . Throat cancer Paternal Uncle        smoker  . Lung cancer Cousin        smoker  . Colon cancer Neg Hx   . Pancreatic cancer Neg Hx   . Stomach cancer Neg Hx   . Liver disease Neg Hx   . Colon polyps Neg Hx   . Esophageal cancer Neg Hx   . Rectal cancer Neg Hx    Social History   Socioeconomic History  . Marital status: Married    Spouse name: Not on file  . Number of children: 1  . Years of education: Not on file  . Highest education level: Not on file   Occupational History  . Occupation: retired  Scientific laboratory technician  . Financial resource strain: Not on file  . Food insecurity:    Worry: Not on file    Inability: Not on file  . Transportation needs:    Medical: Not on file    Non-medical: Not on file  Tobacco Use  . Smoking status: Never Smoker  . Smokeless tobacco: Never Used  Substance and Sexual Activity  . Alcohol use: Yes    Alcohol/week: 1.0 standard drinks    Types: 1 Glasses of wine per week    Comment: 1 glass of wine per week  . Drug use: No  . Sexual activity: Yes    Partners: Male    Birth control/protection: Other-see comments, Post-menopausal, Surgical    Comment: TVH--Still has ovaries  Lifestyle  . Physical activity:    Days per week: Not on file    Minutes per session: Not on file  . Stress: Not on file  Relationships  . Social connections:    Talks on phone: Not on file    Gets together: Not on file    Attends religious service: Not on file    Active member of club or organization: Not on file    Attends meetings of clubs or organizations: Not on file    Relationship status: Not on file  Other Topics Concern  . Not on file  Social History Narrative  . Not on file    Outpatient Encounter Medications as of 11/19/2018  Medication Sig  . Ascorbic Acid (VITAMIN C PO) Take by mouth.  Marland Kitchen atorvastatin (LIPITOR) 10 MG tablet TAKE 1 TABLET BY MOUTH EVERY DAY (Patient taking differently: Take 10 mg by mouth every evening. )  . azelastine (ASTELIN) 0.1 % nasal spray 1 spray in each nostril twice daily.  . beta carotene w/minerals (OCUVITE) tablet Take 1 tablet by mouth daily.  . Calcium Citrate (CITRACAL PO) Take 2 tablets by mouth daily.  . cetirizine (ZYRTEC) 10 MG tablet Take 1 tablet (10 mg total) by mouth daily.  . clobetasol ointment (TEMOVATE) 0.05 % Apply topically 2 (two) times daily as needed. Use for 2 weeks at time as needed.  . Coenzyme Q10 (COQ10) 200 MG CAPS Take 1 tablet by mouth daily.  Marland Kitchen EPIPEN  2-PAK 0.3 MG/0.3ML SOAJ injection Inject 1 mg into the muscle as needed. Reported on 06/28/2016  . Ergocalciferol (VITAMIN D2) 400 units TABS Take 1 capsule by mouth daily.  Marland Kitchen esomeprazole (NEXIUM) 40 MG capsule Take  1 capsule (40 mg total) by mouth daily.  . fish oil-omega-3 fatty acids 1000 MG capsule Take 2 g by mouth daily.  . fluorouracil (EFUDEX) 5 % cream APPLY ON THE SKIN NIGHTLY TO HANDS UP TO 3 WEEKS AS TOLERATED  . fluticasone (FLONASE) 50 MCG/ACT nasal spray Place 2 sprays into both nostrils daily.  Marland Kitchen lactobacillus acidophilus (BACID) TABS tablet Take 2 tablets by mouth 2 (two) times a week.  . meclizine (ANTIVERT) 12.5 MG tablet Take 1 tablet (12.5 mg total) by mouth 3 (three) times daily as needed for dizziness.  . Multiple Vitamin (MULTIVITAMIN) capsule Take 1 capsule by mouth daily.  . ondansetron (ZOFRAN ODT) 4 MG disintegrating tablet Take 1 tablet (4 mg total) by mouth every 8 (eight) hours as needed for nausea or vomiting.  Marland Kitchen PROAIR HFA 108 (90 Base) MCG/ACT inhaler INL 2 PFS PO Q 4 TO 6 H PRN  . valsartan (DIOVAN) 160 MG tablet Take 1 tablet (160 mg total) by mouth daily. (Patient taking differently: Take 160 mg by mouth every morning. )   No facility-administered encounter medications on file as of 11/19/2018.     Activities of Daily Living In your present state of health, do you have any difficulty performing the following activities: 11/19/2018 11/17/2018  Hearing? N N  Vision? N N  Difficulty concentrating or making decisions? N N  Walking or climbing stairs? N N  Dressing or bathing? N N  Doing errands, shopping? N N  Preparing Food and eating ? N -  Using the Toilet? N -  In the past six months, have you accidently leaked urine? N -  Do you have problems with loss of bowel control? N -  Managing your Medications? N -  Managing your Finances? N -  Housekeeping or managing your Housekeeping? N -  Some recent data might be hidden    Patient Care Team: Midge Minium, MD as PCP - General (Family Medicine) Milus Banister, MD as Attending Physician (Gastroenterology) Harold Hedge, Darrick Grinder, MD as Consulting Physician (Allergy and Immunology) Nunzio Cobbs, MD as Consulting Physician (Obstetrics and Gynecology) Consuella Lose, MD as Consulting Physician (Neurosurgery) Otelia Sergeant, OD as Consulting Physician Jamesetta Geralds, [facility], Skin Surgery    Assessment:   This is a routine wellness examination for [facility].  Exercise Activities and Dietary recommendations Current Exercise Habits: Structured exercise class, Type of exercise: Other - see comments(stationary bike), Frequency (Times/Week): 3, Exercise limited by: None identified Diet (meal preparation, eat out, water intake, caffeinated beverages, dairy products, fruits and vegetables):   Eats 3 meals/days, plenty of vegetables. "room for improvement"  Goals    . Weight (lb) < 145 lb (65.8 kg)     Lose weight        Fall Risk Fall Risk  11/19/2018 11/17/2018 11/12/2017 01/27/2017 11/04/2016  Falls in the past year? 0 0 No No No    Depression Screen PHQ 2/9 Scores 11/19/2018 11/17/2018 11/12/2017 07/22/2017  PHQ - 2 Score 0 0 0 0  PHQ- 9 Score - 0 - 0     Cognitive Function MMSE - Mini Mental State Exam 11/19/2018 11/12/2017  Orientation to time 5 5  Orientation to Place 5 5  Registration 3 3  Attention/ Calculation 5 5  Recall 3 3  Language- name 2 objects 2 2  Language- repeat 1 1  Language- follow 3 step command 3 3  Language- read & follow direction 1 1  Write a sentence 1 1  Copy design 1 1  Total score 30 30        Immunization History  Administered Date(s) Administered  . Influenza, High Dose Seasonal PF 09/02/2016  . Influenza,inj,Quad PF,6+ Mos 09/05/2015, 09/15/2018  . Influenza-Unspecified 08/15/2017  . Pneumococcal Conjugate-13 05/22/2015  . Pneumococcal Polysaccharide-23 11/04/2016  . Zoster Recombinat (Shingrix)  03/24/2017, 07/15/2017    Screening Tests Health Maintenance  Topic Date Due  . TETANUS/TDAP  11/18/2019 (Originally 02/22/1965)  . Fecal DNA (Cologuard)  08/05/2019  . MAMMOGRAM  12/23/2019  . INFLUENZA VACCINE  Completed  . DEXA SCAN  Completed  . Hepatitis C Screening  Completed  . PNA vac Low Risk Adult  Completed        Plan:    Bring a copy of your living will and/or healthcare power of attorney to your next office visit.  Continue doing brain stimulating activities (puzzles, reading, adult coloring books, staying active) to keep memory sharp.   I have personally reviewed and noted the following in the patient's chart:    . Medical and social history . Use of alcohol, tobacco or illicit drugs  . Current medications and supplements . Functional ability and status . Nutritional status . Physical activity . Advanced directives . List of other physicians . Hospitalizations, surgeries, and ER visits in previous 12 months . Vitals . Screenings to include cognitive, depression, and falls . Referrals and appointments  In addition, I have reviewed and discussed with patient certain preventive protocols, quality metrics, and best practice recommendations. A written personalized care plan for preventive services as well as general preventive health recommendations were provided to patient.     Gerilyn Nestle, RN  11/19/2018  Reviewed documentation provided by RN and agree w/ above.  Annye Asa, MD

## 2018-11-19 ENCOUNTER — Other Ambulatory Visit: Payer: Self-pay

## 2018-11-19 ENCOUNTER — Ambulatory Visit (INDEPENDENT_AMBULATORY_CARE_PROVIDER_SITE_OTHER): Payer: Medicare Other

## 2018-11-19 ENCOUNTER — Other Ambulatory Visit: Payer: Medicare Other

## 2018-11-19 VITALS — BP 138/70 | HR 52 | Ht 63.0 in | Wt 150.0 lb

## 2018-11-19 DIAGNOSIS — Z0000 Encounter for general adult medical examination without abnormal findings: Secondary | ICD-10-CM | POA: Diagnosis not present

## 2018-11-19 NOTE — Patient Instructions (Addendum)

## 2018-11-27 ENCOUNTER — Encounter: Payer: Self-pay | Admitting: Family Medicine

## 2018-11-27 ENCOUNTER — Ambulatory Visit (INDEPENDENT_AMBULATORY_CARE_PROVIDER_SITE_OTHER): Payer: Medicare Other | Admitting: Family Medicine

## 2018-11-27 ENCOUNTER — Other Ambulatory Visit: Payer: Self-pay

## 2018-11-27 VITALS — BP 119/78 | HR 56 | Temp 98.1°F | Resp 16 | Ht 63.0 in | Wt 150.4 lb

## 2018-11-27 DIAGNOSIS — M810 Age-related osteoporosis without current pathological fracture: Secondary | ICD-10-CM | POA: Diagnosis not present

## 2018-11-27 DIAGNOSIS — I10 Essential (primary) hypertension: Secondary | ICD-10-CM | POA: Diagnosis not present

## 2018-11-27 LAB — HEPATIC FUNCTION PANEL
ALT: 17 U/L (ref 0–35)
AST: 18 U/L (ref 0–37)
Albumin: 4.4 g/dL (ref 3.5–5.2)
Alkaline Phosphatase: 61 U/L (ref 39–117)
Bilirubin, Direct: 0.1 mg/dL (ref 0.0–0.3)
Total Bilirubin: 0.4 mg/dL (ref 0.2–1.2)
Total Protein: 7.2 g/dL (ref 6.0–8.3)

## 2018-11-27 LAB — VITAMIN D 25 HYDROXY (VIT D DEFICIENCY, FRACTURES): VITD: 80.07 ng/mL (ref 30.00–100.00)

## 2018-11-27 LAB — LIPID PANEL
Cholesterol: 188 mg/dL (ref 0–200)
HDL: 65.9 mg/dL (ref >39.00)
LDL CALC: 94 mg/dL (ref 0–99)
NonHDL: 122.16
Total CHOL/HDL Ratio: 3
Triglycerides: 142 mg/dL (ref 0.0–149.0)
VLDL: 28.4 mg/dL (ref 0.0–40.0)

## 2018-11-27 LAB — TSH: TSH: 1.39 u[IU]/mL (ref 0.35–4.50)

## 2018-11-27 NOTE — Assessment & Plan Note (Signed)
Check Vit D level and replete prn. 

## 2018-11-27 NOTE — Patient Instructions (Signed)
Follow up in 6 months to recheck BP and cholesterol We'll notify you of your lab results and make any changes if needed Keep up the good work!  You look great! Call with any questions or concerns Happy Holidays!!!

## 2018-11-27 NOTE — Assessment & Plan Note (Signed)
Chronic problem.  Well controlled since decreasing Valsartan dose to 80mg  daily.  Asymptomatic.  Check labs.  Will continue to follow.

## 2018-11-27 NOTE — Progress Notes (Signed)
   Subjective:    Patient ID: Sarah [name], female    DOB: September 19, 1946, 72 y.o.   MRN: [id number]  HPI HTN- chronic problem, at last visit Valsartan was decreased to 80mg  daily.  Pt reports feeling good since decreasing medication.  No CP, SOB, HAs, visual changes.  Pt has been checking BP at home- yesterday was 117/65.   Review of Systems For ROS see HPI     Objective:   Physical Exam Vitals signs reviewed.  Constitutional:      General: She is not in acute distress.    Appearance: She is well-developed.  HENT:     Head: Normocephalic and atraumatic.  Eyes:     Conjunctiva/sclera: Conjunctivae normal.     Pupils: Pupils are equal, round, and reactive to light.  Neck:     Musculoskeletal: Normal range of motion and neck supple.     Thyroid: No thyromegaly.  Cardiovascular:     Rate and Rhythm: Normal rate and regular rhythm.     Heart sounds: Normal heart sounds. No murmur.  Pulmonary:     Effort: Pulmonary effort is normal. No respiratory distress.     Breath sounds: Normal breath sounds.  Abdominal:     General: There is no distension.     Palpations: Abdomen is soft.     Tenderness: There is no abdominal tenderness.  Lymphadenopathy:     Cervical: No cervical adenopathy.  Skin:    General: Skin is warm and dry.  Neurological:     Mental Status: She is alert and oriented to person, place, and time.  Psychiatric:        Behavior: Behavior normal.           Assessment & Plan:

## 2018-11-30 ENCOUNTER — Encounter: Payer: Self-pay | Admitting: General Practice

## 2018-12-23 ENCOUNTER — Ambulatory Visit
Admission: RE | Admit: 2018-12-23 | Discharge: 2018-12-23 | Disposition: A | Discharge status: Home / Self Care | Payer: Medicare Other | Source: Ambulatory Visit | Attending: Obstetrics and Gynecology | Admitting: Obstetrics and Gynecology

## 2018-12-23 DIAGNOSIS — Z1231 Encounter for screening mammogram for malignant neoplasm of breast: Secondary | ICD-10-CM

## 2019-01-05 ENCOUNTER — Other Ambulatory Visit: Payer: Self-pay | Admitting: Family Medicine

## 2019-01-15 ENCOUNTER — Encounter: Payer: Self-pay | Admitting: Family Medicine

## 2019-01-18 MED ORDER — VALSARTAN 80 MG PO TABS: 80.0000 mg | ORAL_TABLET | Freq: Every day | ORAL | 1 refills | Status: DC

## 2019-01-18 MED ORDER — ATORVASTATIN CALCIUM 10 MG PO TABS: 10.0000 mg | ORAL_TABLET | ORAL | 1 refills | Status: DC

## 2019-02-18 ENCOUNTER — Ambulatory Visit (INDEPENDENT_AMBULATORY_CARE_PROVIDER_SITE_OTHER): Payer: Medicare Other | Admitting: Family Medicine

## 2019-02-18 ENCOUNTER — Encounter: Payer: Self-pay | Admitting: Family Medicine

## 2019-02-18 ENCOUNTER — Other Ambulatory Visit: Payer: Self-pay

## 2019-02-18 VITALS — BP 121/82 | HR 64 | Temp 98.0°F | Resp 16 | Ht 63.0 in | Wt 153.5 lb

## 2019-02-18 DIAGNOSIS — R079 Chest pain, unspecified: Secondary | ICD-10-CM

## 2019-02-18 DIAGNOSIS — M4722 Other spondylosis with radiculopathy, cervical region: Secondary | ICD-10-CM | POA: Diagnosis not present

## 2019-02-18 NOTE — Progress Notes (Signed)
   Subjective:    Patient ID: Sarah [name], female    DOB: 1945/12/29, 73 y.o.   MRN: [id number]  HPI Pain- pt reports 2 separate episodes ~1 month apart.  Both episodes occurred in the car.  'intense pain' across upper back and radiated to chest.  Pain lasted 'less than 2 minutes'.  No associated SOB.  No diaphoresis or nausea.  Does not occur w/ exertion.  Has not occurred outside of those 2 episodes.  Has known cervical disc disease.  Back pain was initial sxs that then radiated to chest.  Pt has chronic radicular neck pain from neck to base of shoulder blade- triggered by any motion w/ neck leaning forward.  These 2 episodes were different.   Review of Systems For ROS see HPI     Objective:   Physical Exam Vitals signs reviewed.  Constitutional:      General: She is not in acute distress.    Appearance: Normal appearance. She is normal weight.  HENT:     Head: Normocephalic and atraumatic.  Neck:     Musculoskeletal: Normal range of motion. No neck rigidity or muscular tenderness.     Vascular: No carotid bruit.     Comments: Pt usually wears soft collar due to cervical radiculopathy Cardiovascular:     Rate and Rhythm: Normal rate and regular rhythm.     Pulses: Normal pulses.  Pulmonary:     Effort: Pulmonary effort is normal. No respiratory distress.     Breath sounds: Normal breath sounds. No wheezing.  Musculoskeletal:        General: Tenderness (TTP over L infraspinatous w/ obvious spasm) present.  Lymphadenopathy:     Cervical: No cervical adenopathy.  Neurological:     Mental Status: She is alert.           Assessment & Plan:  Chest pain- 2 separate, self limited episodes.  Currently asymptomatic.  EKG WNL.  Suspect pain was musculoskeletal due to pts ongoing cervical spine issues.  Will re-refer to neurosurgery given ongoing neck issues.  Reviewed supportive care and red flags that should prompt return.  Pt expressed understanding and is in agreement w/ plan.

## 2019-02-18 NOTE — Patient Instructions (Signed)
Follow up as needed or as scheduled Your EKG is unchanged from previous- this is great news! I doubt the pain is heart related!  But please document your symptoms if they return We'll call you with your Neurosurgery appt Call with any questions or concerns Happy Early Birthday!

## 2019-05-01 ENCOUNTER — Other Ambulatory Visit: Payer: Self-pay | Admitting: Family Medicine

## 2019-05-28 ENCOUNTER — Encounter: Payer: Self-pay | Admitting: Family Medicine

## 2019-05-28 ENCOUNTER — Ambulatory Visit: Payer: Medicare Other | Admitting: Family Medicine

## 2019-05-28 ENCOUNTER — Other Ambulatory Visit: Payer: Self-pay

## 2019-05-28 ENCOUNTER — Ambulatory Visit (INDEPENDENT_AMBULATORY_CARE_PROVIDER_SITE_OTHER): Payer: Medicare Other | Admitting: Family Medicine

## 2019-05-28 VITALS — Temp 97.2°F

## 2019-05-28 DIAGNOSIS — I10 Essential (primary) hypertension: Secondary | ICD-10-CM

## 2019-05-28 DIAGNOSIS — E785 Hyperlipidemia, unspecified: Secondary | ICD-10-CM

## 2019-05-28 NOTE — Progress Notes (Signed)
Virtual Visit via Video   I connected with patient on 05/28/19 at  9:40 AM EDT by a video enabled telemedicine application and verified that I am speaking with the correct person using two identifiers.  Location patient: Home Location provider: Acupuncturist, Office Persons participating in the virtual visit: Patient, Provider, [place] (Jess B)  I discussed the limitations of evaluation and management by telemedicine and the availability of in person appointments. The patient expressed understanding and agreed to proceed.  Subjective:   HPI:   HTN- chronic problem, on Valsartan 80mg  daily.  Unable to check BP today.  Denies CP, SOB, HAs, visual changes, edema.  Hyperlipidemia- chronic problem, on Atorvastatin 10mg  daily.  Denies abd pain, N/V.  ROS:   See pertinent positives and negatives per HPI.  Patient Active Problem List   Diagnosis Date Noted  . Diverticulitis 07/09/2017  . Primary osteoarthritis of both hands 12/23/2016  . Primary osteoarthritis of both knees 12/23/2016  . Postural kyphosis of thoracic region 12/23/2016  . DJD (degenerative joint disease), cervical 12/23/2016  . History of gastroesophageal reflux (GERD) 12/23/2016  . History of fracture of left hip 12/23/2016  . Physical exam 11/04/2016  . Environmental and seasonal allergies 06/27/2016  . Esophageal dysphagia 06/03/2016  . Osteoporosis 08/03/2014  . Hyperlipidemia 11/06/2013  . Hypertension 11/06/2013  . Environmental allergies 11/06/2013    Social History   Tobacco Use  . Smoking status: Never Smoker  . Smokeless tobacco: Never Used  Substance Use Topics  . Alcohol use: Yes    Alcohol/week: 1.0 standard drinks    Types: 1 Glasses of wine per week    Comment: 1 glass of wine per week    Current Outpatient Medications:  .  Ascorbic Acid (VITAMIN C PO), Take by mouth., Disp: , Rfl:  .  atorvastatin (LIPITOR) 10 MG tablet, Take 1 tablet (10 mg total) by mouth every 3 (three) days.,  Disp: 30 tablet, Rfl: 1 .  azelastine (ASTELIN) 0.1 % nasal spray, 1 spray in each nostril twice daily., Disp: 30 mL, Rfl: 5 .  beta carotene w/minerals (OCUVITE) tablet, Take 1 tablet by mouth daily., Disp: , Rfl:  .  Calcium Citrate (CITRACAL PO), Take 2 tablets by mouth daily., Disp: , Rfl:  .  cetirizine (ZYRTEC) 10 MG tablet, Take 1 tablet (10 mg total) by mouth daily., Disp: 30 tablet, Rfl: 11 .  clobetasol ointment (TEMOVATE) 0.05 %, Apply topically 2 (two) times daily as needed. Use for 2 weeks at time as needed., Disp: 30 g, Rfl: 0 .  Coenzyme Q10 (COQ10) 200 MG CAPS, Take 1 tablet by mouth daily., Disp: , Rfl:  .  EPIPEN 2-PAK 0.3 MG/0.3ML SOAJ injection, Inject 1 mg into the muscle as needed. Reported on 06/28/2016, Disp: , Rfl:  .  Ergocalciferol (VITAMIN D2) 400 units TABS, Take 1 capsule by mouth daily., Disp: , Rfl:  .  esomeprazole (NEXIUM) 40 MG capsule, TAKE 1 CAPSULE(40 MG) BY MOUTH DAILY, Disp: 30 capsule, Rfl: 3 .  fish oil-omega-3 fatty acids 1000 MG capsule, Take 2 g by mouth daily., Disp: , Rfl:  .  fluorouracil (EFUDEX) 5 % cream, APPLY ON THE SKIN NIGHTLY TO HANDS UP TO 3 WEEKS AS TOLERATED, Disp: , Rfl: 0 .  fluticasone (FLONASE) 50 MCG/ACT nasal spray, Place 2 sprays into both nostrils daily., Disp: , Rfl:  .  lactobacillus acidophilus (BACID) TABS tablet, Take 2 tablets by mouth 2 (two) times a week., Disp: , Rfl:  .  Multiple Vitamin (MULTIVITAMIN) capsule, Take 1 capsule by mouth daily., Disp: , Rfl:  .  PROAIR HFA 108 (90 Base) MCG/ACT inhaler, INL 2 PFS PO Q 4 TO 6 H PRN, Disp: , Rfl: 0 .  valsartan (DIOVAN) 80 MG tablet, Take 1 tablet (80 mg total) by mouth daily., Disp: 90 tablet, Rfl: 1  Allergies  Allergen Reactions  . Evista [Raloxifene]     Respiratory infections, shortness of breath(not anaphylaxis), leg heaviness.  . Fosamax [Alendronate] Other (See Comments)    "Due to Esophagus problems"--narrow  . Singulair [Montelukast] Other (See Comments)     unknown    Objective:   Temp (!) 97.2 F (36.2 C) (Oral)   LMP 12/16/1993 (Approximate)  AAOx3, NAD NCAT, EOMI No obvious CN deficits Coloring WNL Pt is able to speak clearly, coherently without shortness of breath or increased work of breathing.  Thought process is linear.  Mood is appropriate.   Assessment and Plan:   HTN- chronic problem.  Pt has hx of adequate control.  No means to take BP today.  Asymptomatic.  Will check BP when she comes for labs.  Pt expressed understanding and is in agreement w/ plan.   Hyperlipidemia- chronic problem.  Tolerating statin w/o difficulty.  Encouraged healthy diet and regular exercise.  Check labs.  Adjust meds prn    Annye Asa, MD 05/28/2019

## 2019-05-28 NOTE — Progress Notes (Signed)
I have discussed the procedure for the virtual visit with the patient who has given consent to proceed with assessment and treatment.   Jessica L Brodmerkel, CMA     

## 2019-06-02 ENCOUNTER — Ambulatory Visit (INDEPENDENT_AMBULATORY_CARE_PROVIDER_SITE_OTHER): Payer: Medicare Other

## 2019-06-02 ENCOUNTER — Other Ambulatory Visit: Payer: Self-pay

## 2019-06-02 DIAGNOSIS — I10 Essential (primary) hypertension: Secondary | ICD-10-CM | POA: Diagnosis not present

## 2019-06-02 DIAGNOSIS — E785 Hyperlipidemia, unspecified: Secondary | ICD-10-CM

## 2019-06-02 LAB — LIPID PANEL
Cholesterol: 209 mg/dL — ABNORMAL HIGH (ref 0–200)
HDL: 68.6 mg/dL (ref >39.00)
LDL Cholesterol: 113 mg/dL — ABNORMAL HIGH (ref 0–99)
NonHDL: 140.12
Total CHOL/HDL Ratio: 3
Triglycerides: 138 mg/dL (ref 0.0–149.0)
VLDL: 27.6 mg/dL (ref 0.0–40.0)

## 2019-06-02 LAB — HEPATIC FUNCTION PANEL
ALT: 16 U/L (ref 0–35)
AST: 17 U/L (ref 0–37)
Albumin: 4.4 g/dL (ref 3.5–5.2)
Alkaline Phosphatase: 61 U/L (ref 39–117)
Bilirubin, Direct: 0.1 mg/dL (ref 0.0–0.3)
Total Bilirubin: 0.6 mg/dL (ref 0.2–1.2)
Total Protein: 6.8 g/dL (ref 6.0–8.3)

## 2019-06-02 LAB — BASIC METABOLIC PANEL
BUN: 15 mg/dL (ref 6–23)
CO2: 29 mEq/L (ref 19–32)
Calcium: 9.3 mg/dL (ref 8.4–10.5)
Chloride: 101 mEq/L (ref 96–112)
Creatinine, Ser: 0.94 mg/dL (ref 0.40–1.20)
GFR: 58.33 mL/min — ABNORMAL LOW (ref >60.00)
Glucose, Bld: 79 mg/dL (ref 70–99)
Potassium: 4 mEq/L (ref 3.5–5.1)
Sodium: 139 mEq/L (ref 135–145)

## 2019-06-02 LAB — CBC WITH DIFFERENTIAL/PLATELET
Basophils Absolute: 0.1 10*3/uL (ref 0.0–0.1)
Basophils Relative: 1.3 % (ref 0.0–3.0)
Eosinophils Absolute: 0.2 10*3/uL (ref 0.0–0.7)
Eosinophils Relative: 3.8 % (ref 0.0–5.0)
HCT: 39.6 % (ref 36.0–46.0)
Hemoglobin: 13.4 g/dL (ref 12.0–15.0)
Lymphocytes Relative: 38.6 % (ref 12.0–46.0)
Lymphs Abs: 1.6 10*3/uL (ref 0.7–4.0)
MCHC: 33.8 g/dL (ref 30.0–36.0)
MCV: 89.9 fl (ref 78.0–100.0)
Monocytes Absolute: 0.3 10*3/uL (ref 0.1–1.0)
Monocytes Relative: 7.5 % (ref 3.0–12.0)
Neutro Abs: 2 10*3/uL (ref 1.4–7.7)
Neutrophils Relative %: 48.8 % (ref 43.0–77.0)
Platelets: 210 10*3/uL (ref 150.0–400.0)
RBC: 4.41 Mil/uL (ref 3.87–5.11)
RDW: 13.7 % (ref 11.5–15.5)
WBC: 4.1 10*3/uL (ref 4.0–10.5)

## 2019-06-02 LAB — TSH: TSH: 1.15 u[IU]/mL (ref 0.35–4.50)

## 2019-06-29 ENCOUNTER — Other Ambulatory Visit: Payer: Self-pay | Admitting: Family Medicine

## 2019-07-10 ENCOUNTER — Other Ambulatory Visit: Payer: Self-pay | Admitting: Family Medicine

## 2019-08-05 ENCOUNTER — Encounter: Payer: Self-pay | Admitting: Family Medicine

## 2019-08-10 ENCOUNTER — Other Ambulatory Visit: Payer: Self-pay

## 2019-08-10 ENCOUNTER — Ambulatory Visit (INDEPENDENT_AMBULATORY_CARE_PROVIDER_SITE_OTHER): Payer: Medicare Other | Admitting: Family Medicine

## 2019-08-10 ENCOUNTER — Encounter: Payer: Self-pay | Admitting: Family Medicine

## 2019-08-10 VITALS — BP 120/79 | HR 58 | Temp 97.9°F | Resp 17 | Ht 63.0 in | Wt 149.0 lb

## 2019-08-10 DIAGNOSIS — M25562 Pain in left knee: Secondary | ICD-10-CM | POA: Diagnosis not present

## 2019-08-10 DIAGNOSIS — Z23 Encounter for immunization: Secondary | ICD-10-CM

## 2019-08-10 NOTE — Patient Instructions (Signed)
Follow up as needed or as scheduled We'll call you with your ortho appt for the knee ICE! Call with any questions or concerns Stay Safe!!!

## 2019-08-10 NOTE — Progress Notes (Signed)
   Subjective:    Patient ID: Sarah [name], female    DOB: June 26, 1946, 73 y.o.   MRN: [id number]  HPI Knee pain- L knee, started 'several weeks ago'.  'All of a sudden it would feel like it was giving out while going up the stairs'.  Has happened once going down the stairs 'which was scary'.  Pt tore ACL 'years ago'- did not have this surgically repaired.  Had both knees 'scoped' prior to ACL.  No known injury.   Review of Systems For ROS see HPI     Objective:   Physical Exam Vitals signs reviewed.  Constitutional:      General: She is not in acute distress.    Appearance: Normal appearance.  Cardiovascular:     Pulses: Normal pulses.  Musculoskeletal:        General: Tenderness (TTP over L lateral joint line) present. No swelling or deformity.  Skin:    General: Skin is warm and dry.     Findings: No erythema.  Neurological:     General: No focal deficit present.     Mental Status: She is alert and oriented to person, place, and time.           Assessment & Plan:  L lateral knee pain- new.  Pt reports a few weeks of sxs that have been consistently worsening.  Pain over lateral joint line and instability w/ stairs is concerning for a meniscal tear.  Refer to ortho for complete evaluation and tx.  Pt expressed understanding and is in agreement w/ plan.

## 2019-08-24 ENCOUNTER — Other Ambulatory Visit: Payer: Self-pay | Admitting: Family Medicine

## 2019-10-22 ENCOUNTER — Ambulatory Visit: Payer: Medicare Other | Admitting: Obstetrics and Gynecology

## 2019-11-01 ENCOUNTER — Ambulatory Visit: Payer: Medicare Other | Admitting: Obstetrics and Gynecology

## 2019-11-22 ENCOUNTER — Ambulatory Visit (INDEPENDENT_AMBULATORY_CARE_PROVIDER_SITE_OTHER): Payer: Medicare Other | Admitting: Family Medicine

## 2019-11-22 ENCOUNTER — Other Ambulatory Visit: Payer: Self-pay

## 2019-11-22 ENCOUNTER — Encounter: Payer: Self-pay | Admitting: Family Medicine

## 2019-11-22 DIAGNOSIS — I10 Essential (primary) hypertension: Secondary | ICD-10-CM | POA: Diagnosis not present

## 2019-11-22 DIAGNOSIS — M810 Age-related osteoporosis without current pathological fracture: Secondary | ICD-10-CM | POA: Diagnosis not present

## 2019-11-22 DIAGNOSIS — Z1211 Encounter for screening for malignant neoplasm of colon: Secondary | ICD-10-CM

## 2019-11-22 DIAGNOSIS — E785 Hyperlipidemia, unspecified: Secondary | ICD-10-CM

## 2019-11-22 DIAGNOSIS — Z0000 Encounter for general adult medical examination without abnormal findings: Secondary | ICD-10-CM

## 2019-11-22 NOTE — Progress Notes (Signed)
I have discussed the procedure for the virtual visit with the patient who has given consent to proceed with assessment and treatment.   Pt unable to obtain vitals.   Sarah [name], CMA     

## 2019-11-22 NOTE — Progress Notes (Signed)
Virtual Visit via Video   I connected with patient on 11/22/19 at  8:00 AM EST by a video enabled telemedicine application and verified that I am speaking with the correct person using two identifiers.  Location patient: Home Location provider: [facility]up[facility], Office Persons participating in the virtual visit: Patient, Provider, [place] (Jess B)  I discussed the limitations of evaluation and management by telemedicine and the availability of in person appointments. The patient expressed understanding and agreed to proceed.  Subjective:   HPI:  Here today for MWV and CPE.  Risk Factors: HTN- chronic problem, on Valsartan 80mg  Hyperlipidemia- chronic problem, on Lipitor 10mg  daily  Osteoporosis- chronic problem, on Vit D Physical Activity: walking daily x25 minutes (weather permitting) Fall Risk: low Depression: denies current sxs, mild anxiety w/ COVID situation Hearing: normal to conversational tones and whispered voice at 6 ft ADL's: independent Cognitive: normal linear thought process, memory and attention intact Home Safety: safe at home, lives w/ husband Height, Weight, BMI, Visual Acuity: see vitals, vision corrected to 20/20 w/ glasses Counseling: due for Cologuard, UTD on mammo.  UTD on immunizations.  Could have repeat DEXA if desired (not interested b/c she doesn't want to tx osteoporosis) Labs Ordered: See A&P Care Plan: See A&P   Patient Care Team    Relationship Specialty Notifications Start End  Midge Minium, MD PCP - General Family Medicine  04/08/16   Milus Banister, MD Attending Physician Gastroenterology  06/27/16   Harold Hedge, Darrick Grinder, MD Consulting Physician Allergy and Immunology  06/27/16   Nunzio Cobbs, MD Consulting Physician Obstetrics and Gynecology  06/27/16   Consuella Lose, MD Consulting Physician Neurosurgery  06/27/16   Otelia Sergeant, OD Consulting Physician   06/27/16   Jamesetta Geralds, [facility]  11/12/17    [facility]    11/12/17      ROS:  Patient reports no vision/ hearing changes, adenopathy,fever, weight change,  persistant/recurrent hoarseness , swallowing issues, chest pain, palpitations, edema, persistant/recurrent cough, hemoptysis, dyspnea (rest/exertional/paroxysmal nocturnal), gastrointestinal bleeding (melena, rectal bleeding), abdominal pain, significant heartburn, bowel changes, GU symptoms (dysuria, hematuria, incontinence), Gyn symptoms (abnormal  bleeding, pain),  syncope, focal weakness, memory loss, numbness & tingling, skin/hair/nail changes, abnormal bruising or bleeding, anxiety, or depression.   Patient Active Problem List   Diagnosis Date Noted  . Diverticulitis 07/09/2017  . Primary osteoarthritis of both hands 12/23/2016  . Primary osteoarthritis of both knees 12/23/2016  . Postural kyphosis of thoracic region 12/23/2016  . DJD (degenerative joint disease), cervical 12/23/2016  . History of gastroesophageal reflux (GERD) 12/23/2016  . History of fracture of left hip 12/23/2016  . Physical exam 11/04/2016  . Environmental and seasonal allergies 06/27/2016  . Esophageal dysphagia 06/03/2016  . Osteoporosis 08/03/2014  . Hyperlipidemia 11/06/2013  . Hypertension 11/06/2013  . Environmental allergies 11/06/2013    Social History   Tobacco Use  . Smoking status: Never Smoker  . Smokeless tobacco: Never Used  Substance Use Topics  . Alcohol use: Yes    Alcohol/week: 1.0 standard drinks    Types: 1 Glasses of wine per week    Comment: 1 glass of wine per week    Current Outpatient Medications:  .  Ascorbic Acid (VITAMIN C PO), Take by mouth., Disp: , Rfl:  .  atorvastatin (LIPITOR) 10 MG tablet, TAKE 1 TABLET(10 MG) BY MOUTH EVERY 3 DAYS, Disp: 90 tablet, Rfl: 1 .  azelastine (ASTELIN) 0.1 % nasal spray, 1  spray in each nostril twice daily., Disp: 30 mL, Rfl: 5 .  beta carotene w/minerals (OCUVITE) tablet, Take 1 tablet by mouth daily., Disp: ,  Rfl:  .  Calcium Citrate (CITRACAL PO), Take 2 tablets by mouth daily., Disp: , Rfl:  .  cetirizine (ZYRTEC) 10 MG tablet, Take 1 tablet (10 mg total) by mouth daily., Disp: 30 tablet, Rfl: 11 .  clobetasol ointment (TEMOVATE) 0.05 %, Apply topically 2 (two) times daily as needed. Use for 2 weeks at time as needed., Disp: 30 g, Rfl: 0 .  Coenzyme Q10 (COQ10) 200 MG CAPS, Take 1 tablet by mouth daily., Disp: , Rfl:  .  EPIPEN 2-PAK 0.3 MG/0.3ML SOAJ injection, Inject 1 mg into the muscle as needed. Reported on 06/28/2016, Disp: , Rfl:  .  Ergocalciferol (VITAMIN D2) 400 units TABS, Take 1 capsule by mouth daily., Disp: , Rfl:  .  esomeprazole (NEXIUM) 40 MG capsule, TAKE 1 CAPSULE(40 MG) BY MOUTH DAILY, Disp: 30 capsule, Rfl: 3 .  fish oil-omega-3 fatty acids 1000 MG capsule, Take 2 g by mouth daily., Disp: , Rfl:  .  fluticasone (FLONASE) 50 MCG/ACT nasal spray, Place 2 sprays into both nostrils daily., Disp: , Rfl:  .  lactobacillus acidophilus (BACID) TABS tablet, Take 2 tablets by mouth 2 (two) times a week., Disp: , Rfl:  .  Multiple Vitamin (MULTIVITAMIN) capsule, Take 1 capsule by mouth daily., Disp: , Rfl:  .  PROAIR HFA 108 (90 Base) MCG/ACT inhaler, INL 2 PFS PO Q 4 TO 6 H PRN, Disp: , Rfl: 0 .  valsartan (DIOVAN) 80 MG tablet, TAKE 1 TABLET(80 MG) BY MOUTH DAILY, Disp: 90 tablet, Rfl: 1  Allergies  Allergen Reactions  . Evista [Raloxifene]     Respiratory infections, shortness of breath(not anaphylaxis), leg heaviness.  . Fosamax [Alendronate] Other (See Comments)    "Due to Esophagus problems"--narrow  . Singulair [Montelukast] Other (See Comments)    unknown    Objective:   LMP 12/16/1993 (Approximate)  AAOx3, NAD NCAT, EOMI No obvious CN deficits Coloring WNL Pt is able to speak clearly, coherently without shortness of breath or increased work of breathing.  Thought process is linear.  Mood is appropriate.   Assessment and Plan:   CPE- UTD on mammo, immunizations.   Cologuard order entered.  Pt declines DEXA.  Check labs.  Anticipatory guidance provided.   HTN- chronic problem.  Will get BP check when pt comes for labs.  Currently asymptomatic.  Will follow.  Hyperlipidemia- chronic problem.  Tolerating statin w/o difficulty.  Check labs.  Adjust meds prn   Osteoporosis- ongoing issue.  Pt declines repeat DEXA as she is not interested in treating osteoporosis based on previous reactions to medications.  Check Vit D level and replete prn.   Annye Asa, MD 11/22/2019

## 2019-11-23 ENCOUNTER — Other Ambulatory Visit: Payer: Self-pay | Admitting: Obstetrics and Gynecology

## 2019-11-23 DIAGNOSIS — Z1231 Encounter for screening mammogram for malignant neoplasm of breast: Secondary | ICD-10-CM

## 2019-11-25 ENCOUNTER — Other Ambulatory Visit: Payer: Self-pay

## 2019-11-25 ENCOUNTER — Encounter: Payer: Self-pay | Admitting: General Practice

## 2019-11-25 ENCOUNTER — Ambulatory Visit (INDEPENDENT_AMBULATORY_CARE_PROVIDER_SITE_OTHER): Payer: Medicare Other | Admitting: Emergency Medicine

## 2019-11-25 DIAGNOSIS — E785 Hyperlipidemia, unspecified: Secondary | ICD-10-CM

## 2019-11-25 DIAGNOSIS — I10 Essential (primary) hypertension: Secondary | ICD-10-CM | POA: Diagnosis not present

## 2019-11-25 DIAGNOSIS — M810 Age-related osteoporosis without current pathological fracture: Secondary | ICD-10-CM

## 2019-11-25 LAB — CBC WITH DIFFERENTIAL/PLATELET
Basophils Absolute: 0 10*3/uL (ref 0.0–0.1)
Basophils Relative: 1 % (ref 0.0–3.0)
Eosinophils Absolute: 0.2 10*3/uL (ref 0.0–0.7)
Eosinophils Relative: 4 % (ref 0.0–5.0)
HCT: 41 % (ref 36.0–46.0)
Hemoglobin: 13.8 g/dL (ref 12.0–15.0)
Lymphocytes Relative: 40 % (ref 12.0–46.0)
Lymphs Abs: 1.5 10*3/uL (ref 0.7–4.0)
MCHC: 33.6 g/dL (ref 30.0–36.0)
MCV: 89.8 fl (ref 78.0–100.0)
Monocytes Absolute: 0.3 10*3/uL (ref 0.1–1.0)
Monocytes Relative: 7.6 % (ref 3.0–12.0)
Neutro Abs: 1.8 10*3/uL (ref 1.4–7.7)
Neutrophils Relative %: 47.4 % (ref 43.0–77.0)
Platelets: 220 10*3/uL (ref 150.0–400.0)
RBC: 4.56 Mil/uL (ref 3.87–5.11)
RDW: 13.6 % (ref 11.5–15.5)
WBC: 3.8 10*3/uL — ABNORMAL LOW (ref 4.0–10.5)

## 2019-11-25 LAB — BASIC METABOLIC PANEL
BUN: 15 mg/dL (ref 6–23)
CO2: 29 mEq/L (ref 19–32)
Calcium: 9.4 mg/dL (ref 8.4–10.5)
Chloride: 103 mEq/L (ref 96–112)
Creatinine, Ser: 0.96 mg/dL (ref 0.40–1.20)
GFR: 56.85 mL/min — ABNORMAL LOW (ref >60.00)
Glucose, Bld: 82 mg/dL (ref 70–99)
Potassium: 4.4 mEq/L (ref 3.5–5.1)
Sodium: 141 mEq/L (ref 135–145)

## 2019-11-25 LAB — HEPATIC FUNCTION PANEL
ALT: 16 U/L (ref 0–35)
AST: 19 U/L (ref 0–37)
Albumin: 4.3 g/dL (ref 3.5–5.2)
Alkaline Phosphatase: 65 U/L (ref 39–117)
Bilirubin, Direct: 0.1 mg/dL (ref 0.0–0.3)
Total Bilirubin: 0.6 mg/dL (ref 0.2–1.2)
Total Protein: 7 g/dL (ref 6.0–8.3)

## 2019-11-25 LAB — VITAMIN D 25 HYDROXY (VIT D DEFICIENCY, FRACTURES): VITD: 72.28 ng/mL (ref 30.00–100.00)

## 2019-11-25 LAB — LIPID PANEL
Cholesterol: 207 mg/dL — ABNORMAL HIGH (ref 0–200)
HDL: 70.4 mg/dL (ref >39.00)
LDL Cholesterol: 112 mg/dL — ABNORMAL HIGH (ref 0–99)
NonHDL: 136.73
Total CHOL/HDL Ratio: 3
Triglycerides: 122 mg/dL (ref 0.0–149.0)
VLDL: 24.4 mg/dL (ref 0.0–40.0)

## 2019-11-25 LAB — TSH: TSH: 1.62 u[IU]/mL (ref 0.35–4.50)

## 2019-12-07 ENCOUNTER — Telehealth: Payer: Self-pay

## 2019-12-07 ENCOUNTER — Encounter: Payer: Self-pay | Admitting: Family Medicine

## 2019-12-07 NOTE — Telephone Encounter (Signed)
Cologuard recall letter mailed to patients home address on file.

## 2019-12-21 ENCOUNTER — Other Ambulatory Visit: Payer: Self-pay | Admitting: General Practice

## 2019-12-21 MED ORDER — ESOMEPRAZOLE MAGNESIUM 40 MG PO CPDR: DELAYED_RELEASE_CAPSULE | ORAL | 1 refills | Status: DC

## 2020-01-03 ENCOUNTER — Other Ambulatory Visit: Payer: Self-pay | Admitting: General Practice

## 2020-01-03 MED ORDER — ATORVASTATIN CALCIUM 10 MG PO TABS: ORAL_TABLET | ORAL | 1 refills | Status: DC

## 2020-01-04 ENCOUNTER — Other Ambulatory Visit: Payer: Self-pay | Admitting: Emergency Medicine

## 2020-01-04 DIAGNOSIS — I10 Essential (primary) hypertension: Secondary | ICD-10-CM

## 2020-01-04 MED ORDER — VALSARTAN 80 MG PO TABS: ORAL_TABLET | ORAL | 1 refills | Status: DC

## 2020-01-05 LAB — COLOGUARD: Cologuard: NEGATIVE

## 2020-01-17 ENCOUNTER — Encounter: Payer: Self-pay | Admitting: Family Medicine

## 2020-01-26 ENCOUNTER — Ambulatory Visit: Payer: Medicare Other

## 2020-02-04 ENCOUNTER — Encounter: Payer: Self-pay | Admitting: General Practice

## 2020-02-21 ENCOUNTER — Other Ambulatory Visit: Payer: Self-pay | Admitting: Emergency Medicine

## 2020-02-21 DIAGNOSIS — J310 Chronic rhinitis: Secondary | ICD-10-CM

## 2020-02-21 MED ORDER — AZELASTINE HCL 0.1 % NA SOLN: NASAL | 5 refills | Status: DC

## 2020-02-24 ENCOUNTER — Other Ambulatory Visit: Payer: Self-pay

## 2020-02-24 ENCOUNTER — Ambulatory Visit
Admission: RE | Admit: 2020-02-24 | Discharge: 2020-02-24 | Disposition: A | Discharge status: Home / Self Care | Payer: Medicare Other | Source: Ambulatory Visit | Attending: Obstetrics and Gynecology | Admitting: Obstetrics and Gynecology

## 2020-02-24 DIAGNOSIS — Z1231 Encounter for screening mammogram for malignant neoplasm of breast: Secondary | ICD-10-CM

## 2020-03-15 ENCOUNTER — Other Ambulatory Visit: Payer: Self-pay

## 2020-03-15 ENCOUNTER — Encounter: Payer: Self-pay | Admitting: Family Medicine

## 2020-03-15 NOTE — Telephone Encounter (Signed)
Looks like she will need an appointment with her PCP to discuss and for potential referral.  I believe I still have an opening left tomorrow and I am happy to see her if she prefers.

## 2020-03-15 NOTE — Progress Notes (Signed)
74 y.o. G79P1011 Married Caucasian female here for annual exam.    No bladder or bowel problems.  Did pelvic floor therapy.   Her only concern is the area of lichen sclerosus.  She uses her steroid ointment for a couple of days if needed and then stops.   Completed her Covid vaccine.   PCP: Annye Asa, MD    Patient's last menstrual period was 12/16/1993 (approximate).           Sexually active: Yes.    The current method of family planning is status post hysterectomy.    Exercising: Yes.    walks daily Smoker:  no  Health Maintenance: Pap: 06-03-11 Neg History of abnormal Pap:  no MMG: 02-24-20 3D/Neg/density C/BiRads1 Colonoscopy:10-08-17 polyp/diverticulosis;next due 5 years.  Did Cologuard this year, which was normal. BMD: 09-10-15  Result : Osteoporosis Lt.hip -2.8--declines treatment TDaP: PCP Gardasil:   no [id number] Hep C:05-06-17 Neg Screening Labs:  PCP.    reports that she has never smoked. She has never used smokeless tobacco. She reports previous alcohol use. She reports that she does not use drugs.  Past Medical History:  Diagnosis Date  . Allergy   . Arthritis   . Asthma   . Cancer ([place])    basal cell cancer--nose  . Cataract    left eye and stable   . Cervical disc disease    bulging  . Diverticulitis   . Diverticulosis   . GERD (gastroesophageal reflux disease)   . Hyperlipidemia   . Hypertension   . Osteopenia   . Osteoporosis   . PONV (postoperative nausea and vomiting)   . Status post dilation of esophageal narrowing     Past Surgical History:  Procedure Laterality Date  . ABDOMINAL HYSTERECTOMY  1995   TVH--still has ovaries  . ABDOMINAL SACROCOLPOPEXY  2007   Halban culdoplasty & Burch procedure--Dr. Quincy Simmonds  . APPENDECTOMY     Dr. Hassell Done  . arthroscopic shoulder Left    Dr. Durward Fortes, bone spur  . BLADDER SUSPENSION    . CERVICAL FUSION     C-4, C-5 fusion  . CHOLECYSTECTOMY     Dr. Georgina Quint  . ESOPHAGEAL DILATION     . HIP ARTHROPLASTY Left 11/07/2013   Procedure: ARTHROPLASTY BIPOLAR HIP;  Surgeon: Garald Balding, MD;  Location: [facility];  Service: Orthopedics;  Laterality: Left;  . KNEE ARTHROSCOPY Bilateral    Dr. Durward Fortes  . OTHER SURGICAL HISTORY  2011   posterior Colporrhaphy with Xenform biological graft--Dr. Quincy Simmonds  . RECTOCELE REPAIR  2011   posterior colporrhaphy with Xenform biological graft--Dr. Quincy Simmonds  . UPPER GASTROINTESTINAL ENDOSCOPY      Current Outpatient Medications  Medication Sig Dispense Refill  . Ascorbic Acid (VITAMIN C PO) Take by mouth.    Marland Kitchen atorvastatin (LIPITOR) 10 MG tablet TAKE 1 TABLET(10 MG) BY MOUTH EVERY 3 DAYS 90 tablet 1  . beta carotene w/minerals (OCUVITE) tablet Take 1 tablet by mouth daily.    . Calcium Citrate (CITRACAL PO) Take 2 tablets by mouth daily.    . cetirizine (ZYRTEC) 10 MG tablet Take 1 tablet (10 mg total) by mouth daily. 30 tablet 11  . clobetasol ointment (TEMOVATE) 0.05 % Apply topically 2 (two) times daily as needed. Use for 2 weeks at time as needed. 30 g 0  . Coenzyme Q10 (COQ10) 200 MG CAPS Take 1 tablet by mouth daily.    Marland Kitchen EPIPEN 2-PAK 0.3 MG/0.3ML SOAJ injection Inject 1 mg into the muscle  as needed. Reported on 06/28/2016    . Ergocalciferol (VITAMIN D2) 400 units TABS Take 1 capsule by mouth daily.    Marland Kitchen esomeprazole (NEXIUM) 40 MG capsule TAKE 1 CAPSULE(40 MG) BY MOUTH DAILY 90 capsule 1  . fish oil-omega-3 fatty acids 1000 MG capsule Take 2 g by mouth daily.    . fluticasone (FLONASE) 50 MCG/ACT nasal spray Place 2 sprays into both nostrils daily.    Marland Kitchen lactobacillus acidophilus (BACID) TABS tablet Take 2 tablets by mouth 2 (two) times a week.    . Multiple Vitamin (MULTIVITAMIN) capsule Take 1 capsule by mouth daily.    Marland Kitchen PROAIR HFA 108 (90 Base) MCG/ACT inhaler INL 2 PFS PO Q 4 TO 6 H PRN  0  . valsartan (DIOVAN) 80 MG tablet TAKE 1 TABLET(80 MG) BY MOUTH DAILY 90 tablet 1   No current facility-administered medications for this  visit.    Family History  Problem Relation Age of Onset  . Hypertension Mother   . Diabetes Father   . Hypertension Father   . Heart disease Father   . Diabetes Sister   . Hypertension Sister   . Schizophrenia Sister   . Thyroid disease Sister   . Heart disease Sister   . Throat cancer Paternal Uncle        smoker  . Lung cancer Cousin        smoker  . Colon cancer Neg Hx   . Pancreatic cancer Neg Hx   . Stomach cancer Neg Hx   . Liver disease Neg Hx   . Colon polyps Neg Hx   . Esophageal cancer Neg Hx   . Rectal cancer Neg Hx     Review of Systems  All other systems reviewed and are negative.   Exam:   BP (!) 144/70   Pulse 60   Temp (!) 97.2 F (36.2 C) (Temporal)   Resp 14   Ht 5\' 4"  (1.626 m)   Wt 154 lb 6.4 oz (70 kg)   LMP 12/16/1993 (Approximate)   BMI 26.50 kg/m     General appearance: alert, cooperative and appears stated age Head: normocephalic, without obvious abnormality, atraumatic Neck: no adenopathy, supple, symmetrical, trachea midline and thyroid normal to inspection and palpation Lungs: clear to auscultation bilaterally Breasts: normal appearance, no masses or tenderness, No nipple retraction or dimpling, No nipple discharge or bleeding, No axillary adenopathy Heart: regular rate and rhythm Abdomen: soft, non-tender; no masses, no organomegaly Extremities: extremities normal, atraumatic, no cyanosis or edema Skin: skin color, texture, turgor normal. No rashes or lesions Lymph nodes: cervical, supraclavicular, and axillary nodes normal. Neurologic: grossly normal  Pelvic: External genitalia:  no lesions              No abnormal inguinal nodes palpated.              Urethra:  normal appearing urethra with no masses, tenderness or lesions              Bartholins and Skenes: normal                 Vagina: normal appearing vagina with normal color and discharge, no lesions.  First degree cystocele.  Good apical and posterior vaginal support.                Cervix: absent.              Pap taken: No. Bimanual Exam:  Uterus:  Absent.  Adnexa: no mass, fullness, tenderness              Rectal exam: Yes.  .  Confirms.              Anus:  normal sphincter tone, no lesions  Chaperone was present for exam.  Assessment:   Well woman visit with normal exam. Status post total vaginal hysterectomy.  Ovaries remain. Status post sacrocolpopexy, Halban culdoplasty and Burch.  Status post rectocele repair with biologic graft.  Cystocele. Osteoporosis. Has declined tx.  Intolerant of Evista.  Lichen sclerosus. Controlled with Clobetasol.  Plan: Mammogram screening discussed. Self breast awareness reviewed. Pap and HR HPV as above. Guidelines for Calcium, Vitamin D, regular exercise program including cardiovascular and weight bearing exercise. Refill of Clobetasol ointment.   I offered evaluation with endocrinology for osteoporosis if desires to update her BMD.  Follow up annually and prn.  After visit summary provided.

## 2020-03-16 ENCOUNTER — Ambulatory Visit (INDEPENDENT_AMBULATORY_CARE_PROVIDER_SITE_OTHER): Payer: Medicare Other | Admitting: Obstetrics and Gynecology

## 2020-03-16 ENCOUNTER — Encounter: Payer: Self-pay | Admitting: Obstetrics and Gynecology

## 2020-03-16 VITALS — BP 144/70 | HR 60 | Temp 97.2°F | Resp 14 | Ht 64.0 in | Wt 154.4 lb

## 2020-03-16 DIAGNOSIS — Z01419 Encounter for gynecological examination (general) (routine) without abnormal findings: Secondary | ICD-10-CM | POA: Diagnosis not present

## 2020-03-16 DIAGNOSIS — L900 Lichen sclerosus et atrophicus: Secondary | ICD-10-CM

## 2020-03-16 MED ORDER — CLOBETASOL PROPIONATE 0.05 % EX OINT: TOPICAL_OINTMENT | Freq: Two times a day (BID) | CUTANEOUS | 0 refills | Status: DC | PRN

## 2020-03-16 NOTE — Patient Instructions (Signed)

## 2020-04-21 ENCOUNTER — Ambulatory Visit (INDEPENDENT_AMBULATORY_CARE_PROVIDER_SITE_OTHER): Payer: Medicare Other | Admitting: Podiatry

## 2020-04-21 ENCOUNTER — Encounter: Payer: Self-pay | Admitting: Podiatry

## 2020-04-21 ENCOUNTER — Other Ambulatory Visit: Payer: Self-pay

## 2020-04-21 DIAGNOSIS — M205X1 Other deformities of toe(s) (acquired), right foot: Secondary | ICD-10-CM

## 2020-04-21 DIAGNOSIS — L600 Ingrowing nail: Secondary | ICD-10-CM

## 2020-04-21 MED ORDER — NEOMYCIN-POLYMYXIN-HC 3.5-10000-1 OT SOLN: OTIC | 1 refills | Status: DC

## 2020-04-21 NOTE — Patient Instructions (Signed)

## 2020-04-24 NOTE — Progress Notes (Signed)
Subjective:   Patient ID: Sarah [name], female   DOB: 74 y.o.   MRN: [id number]   HPI Patient presents stating that her right big toenail has become very sore and its thick she cannot cut it and it is become damaged and loose and she cannot wear shoe gear comfortably.  Patient does not smoke likes to be active and also complains about lack of motion of the big toe joint right  ROS      Objective:  Physical Exam Vitals and nursing note reviewed.  Constitutional:      Appearance: She is well-developed.  Pulmonary:     Effort: Pulmonary effort is normal.  Musculoskeletal:        General: Normal range of motion.  Skin:    General: Skin is warm.  Neurological:     Mental Status: She is alert.     Neurovascular status was found to be intact muscle strength was found to be adequate.  Range of motion was found to be within normal limits with patient noted to have inflammation pain of the right hallux nail with a lot of thickness abnormal appearance and looseness of the nailbed for a number of years.  Patient does have good digital perfusion well oriented x3.  Patient also has discomfort in the right big toe joint that she lives with and states the injections we had done previously been helpful     Assessment:  Severe nail disease right big toenail with thickness subungual debris and pain along with hallux limitus deformity right     Plan:  H&P condition reviewed discussed.  For hallux limitus I recommended the continuation of rigid bottom shoes and discussed the consideration for at one point fusion implantation or injection treatment.  For nail I recommended removal and I explained procedure risk and patient read and then signed consent form understanding risk.  I infiltrated the right hallux 60 mg Xylocaine Marcaine mixture sterile prep done and using sterile instrumentation I remove the hallux nail exposed matrix and applied phenol 5 applications 30 seconds followed by alcohol lavage  and sterile dressing.  Gave instructions on soaks and dressing usage and leave dressing on 24 hours but take it off earlier if any throbbing were to occur wrote prescription for drops and encouraged her to call with any questions concerns which arise during the postoperative.

## 2020-05-01 ENCOUNTER — Encounter: Payer: Self-pay | Admitting: Podiatry

## 2020-05-25 ENCOUNTER — Encounter: Payer: Self-pay | Admitting: Family Medicine

## 2020-05-25 ENCOUNTER — Ambulatory Visit (INDEPENDENT_AMBULATORY_CARE_PROVIDER_SITE_OTHER): Payer: Medicare Other | Admitting: Family Medicine

## 2020-05-25 ENCOUNTER — Ambulatory Visit (INDEPENDENT_AMBULATORY_CARE_PROVIDER_SITE_OTHER): Payer: Medicare Other

## 2020-05-25 ENCOUNTER — Other Ambulatory Visit: Payer: Self-pay

## 2020-05-25 VITALS — BP 124/76 | HR 78 | Temp 97.9°F | Resp 16 | Ht 64.0 in | Wt 151.0 lb

## 2020-05-25 DIAGNOSIS — E663 Overweight: Secondary | ICD-10-CM | POA: Diagnosis not present

## 2020-05-25 DIAGNOSIS — M79642 Pain in left hand: Secondary | ICD-10-CM | POA: Diagnosis not present

## 2020-05-25 DIAGNOSIS — E785 Hyperlipidemia, unspecified: Secondary | ICD-10-CM

## 2020-05-25 DIAGNOSIS — I10 Essential (primary) hypertension: Secondary | ICD-10-CM

## 2020-05-25 LAB — LIPID PANEL
Cholesterol: 195 mg/dL (ref 0–200)
HDL: 67.8 mg/dL (ref >39.00)
LDL Cholesterol: 103 mg/dL — ABNORMAL HIGH (ref 0–99)
NonHDL: 127.66
Total CHOL/HDL Ratio: 3
Triglycerides: 125 mg/dL (ref 0.0–149.0)
VLDL: 25 mg/dL (ref 0.0–40.0)

## 2020-05-25 LAB — CBC WITH DIFFERENTIAL/PLATELET
Basophils Absolute: 0 10*3/uL (ref 0.0–0.1)
Basophils Relative: 1.4 % (ref 0.0–3.0)
Eosinophils Absolute: 0.1 10*3/uL (ref 0.0–0.7)
Eosinophils Relative: 3.6 % (ref 0.0–5.0)
HCT: 38.3 % (ref 36.0–46.0)
Hemoglobin: 13.2 g/dL (ref 12.0–15.0)
Lymphocytes Relative: 39.9 % (ref 12.0–46.0)
Lymphs Abs: 1.4 10*3/uL (ref 0.7–4.0)
MCHC: 34.4 g/dL (ref 30.0–36.0)
MCV: 88 fl (ref 78.0–100.0)
Monocytes Absolute: 0.3 10*3/uL (ref 0.1–1.0)
Monocytes Relative: 9.4 % (ref 3.0–12.0)
Neutro Abs: 1.6 10*3/uL (ref 1.4–7.7)
Neutrophils Relative %: 45.7 % (ref 43.0–77.0)
Platelets: 188 10*3/uL (ref 150.0–400.0)
RBC: 4.35 Mil/uL (ref 3.87–5.11)
RDW: 14.1 % (ref 11.5–15.5)
WBC: 3.4 10*3/uL — ABNORMAL LOW (ref 4.0–10.5)

## 2020-05-25 LAB — HEPATIC FUNCTION PANEL
ALT: 18 U/L (ref 0–35)
AST: 21 U/L (ref 0–37)
Albumin: 4.5 g/dL (ref 3.5–5.2)
Alkaline Phosphatase: 60 U/L (ref 39–117)
Bilirubin, Direct: 0.1 mg/dL (ref 0.0–0.3)
Total Bilirubin: 0.5 mg/dL (ref 0.2–1.2)
Total Protein: 6.9 g/dL (ref 6.0–8.3)

## 2020-05-25 LAB — BASIC METABOLIC PANEL
BUN: 19 mg/dL (ref 6–23)
CO2: 29 mEq/L (ref 19–32)
Calcium: 9.7 mg/dL (ref 8.4–10.5)
Chloride: 104 mEq/L (ref 96–112)
Creatinine, Ser: 0.92 mg/dL (ref 0.40–1.20)
GFR: 59.63 mL/min — ABNORMAL LOW (ref >60.00)
Glucose, Bld: 77 mg/dL (ref 70–99)
Potassium: 4.4 mEq/L (ref 3.5–5.1)
Sodium: 141 mEq/L (ref 135–145)

## 2020-05-25 LAB — TSH: TSH: 1.41 u[IU]/mL (ref 0.35–4.50)

## 2020-05-25 NOTE — Assessment & Plan Note (Signed)
Pt is down 3 lbs since April.  She reports changing her diet and increasing her activity level.  She got a kayak yesterday and is very excited.  Will continue to follow.

## 2020-05-25 NOTE — Patient Instructions (Signed)
Schedule your complete physical in 6 months We'll notify you of your lab results and make any changes if needed Go to the [facility] to get your hand xray and we'll determine the next steps In the meantime, ICE Keep up the good work on healthy diet and regular exercise- you look great! Call with any questions or concerns Have a great summer!!!

## 2020-05-25 NOTE — Assessment & Plan Note (Signed)
Chronic problem.  Well controlled.  Asymptomatic.  Check labs.  No anticipated med changes.  Will follow. 

## 2020-05-25 NOTE — Progress Notes (Signed)
   Subjective:    Patient ID: Sarah [name], female    DOB: Aug 04, 1946, 74 y.o.   MRN: [id number]  HPI HTN- chronic problem, on Valsartan 80mg  daily w/ good control.  No CP, SOB, HAs, visual changes, edema.  Hyperlipidemia- chronic problem, on Lipitor 10mg  daily and Fish Oil 2000mg  daily.  Last LDL 112.  Denies abd pain, N/V.  Overweight- pt is down 3 lbs just since April.  Pt is walking regularly and has changed diet.  Bought kayaks yesterday  L hand pain- pt was hit by bungee cord yesterday on dorsum of L hand.  Was unable to knit last night due to pain, unable to grab cup/mug this AM.   Review of Systems For ROS see HPI   This visit occurred during the SARS-CoV-2 public health emergency.  Safety protocols were in place, including screening questions prior to the visit, additional usage of staff PPE, and extensive cleaning of exam room while observing appropriate contact time as indicated for disinfecting solutions.       Objective:   Physical Exam Constitutional:      General: She is not in acute distress.    Appearance: She is well-developed.  HENT:     Head: Normocephalic and atraumatic.  Eyes:     Conjunctiva/sclera: Conjunctivae normal.     Pupils: Pupils are equal, round, and reactive to light.  Neck:     Thyroid: No thyromegaly.  Cardiovascular:     Rate and Rhythm: Normal rate and regular rhythm.     Heart sounds: Normal heart sounds. No murmur heard.   Pulmonary:     Effort: Pulmonary effort is normal. No respiratory distress.     Breath sounds: Normal breath sounds.  Abdominal:     General: There is no distension.     Palpations: Abdomen is soft.     Tenderness: There is no abdominal tenderness.  Musculoskeletal:        General: Tenderness (TTP over L 2nd metacarpal ) present.     Cervical back: Normal range of motion and neck supple.     Comments: Unable to fully make a fist w/ L hand due to pain  Lymphadenopathy:     Cervical: No cervical adenopathy.    Skin:    General: Skin is warm and dry.  Neurological:     Mental Status: She is alert and oriented to person, place, and time.  Psychiatric:        Behavior: Behavior normal.           Assessment & Plan:  L hand injury- pt has pain on dorsum of L hand after bungee cord snapped back and hit it.  TTP over 2nd metacarpal.  Unable to fully make a fist due to pain.  Will get xray to assess.  Pt expressed understanding and is in agreement w/ plan.

## 2020-05-25 NOTE — Assessment & Plan Note (Signed)
Chronic problem, tolerating statin w/o difficulty.  Applauded her recent weight loss and exercise.  Check labs.  Adjust meds prn

## 2020-06-12 ENCOUNTER — Other Ambulatory Visit: Payer: Self-pay | Admitting: General Practice

## 2020-06-12 MED ORDER — ESOMEPRAZOLE MAGNESIUM 40 MG PO CPDR: DELAYED_RELEASE_CAPSULE | ORAL | 1 refills | Status: DC

## 2020-06-27 ENCOUNTER — Other Ambulatory Visit: Payer: Self-pay | Admitting: Family Medicine

## 2020-06-27 DIAGNOSIS — I10 Essential (primary) hypertension: Secondary | ICD-10-CM

## 2020-07-06 ENCOUNTER — Encounter: Payer: Self-pay | Admitting: Family Medicine

## 2020-07-14 ENCOUNTER — Ambulatory Visit: Payer: Medicare Other | Admitting: Family Medicine

## 2020-07-18 ENCOUNTER — Ambulatory Visit (INDEPENDENT_AMBULATORY_CARE_PROVIDER_SITE_OTHER): Payer: Medicare Other | Admitting: Family Medicine

## 2020-07-18 ENCOUNTER — Encounter: Payer: Self-pay | Admitting: Family Medicine

## 2020-07-18 ENCOUNTER — Ambulatory Visit (INDEPENDENT_AMBULATORY_CARE_PROVIDER_SITE_OTHER)
Admission: RE | Admit: 2020-07-18 | Discharge: 2020-07-18 | Disposition: A | Discharge status: Home / Self Care | Payer: Medicare Other | Source: Ambulatory Visit | Attending: Family Medicine | Admitting: Family Medicine

## 2020-07-18 ENCOUNTER — Other Ambulatory Visit: Payer: Self-pay

## 2020-07-18 VITALS — BP 121/82 | HR 78 | Temp 98.0°F | Resp 16 | Ht 64.0 in | Wt 148.2 lb

## 2020-07-18 DIAGNOSIS — S8012XA Contusion of left lower leg, initial encounter: Secondary | ICD-10-CM | POA: Diagnosis not present

## 2020-07-18 NOTE — Progress Notes (Signed)
   Subjective:    Patient ID: Sarah [name], female    DOB: 10-05-1946, 74 y.o.   MRN: [id number]  HPI Bruise- L lower leg.  1st noticed 17 days ago.  Pt hit lower leg on metal bar.  Area has been painful.  Pt reports swollen and hard.     Review of Systems For ROS see HPI   This visit occurred during the SARS-CoV-2 public health emergency.  Safety protocols were in place, including screening questions prior to the visit, additional usage of staff PPE, and extensive cleaning of exam room while observing appropriate contact time as indicated for disinfecting solutions.       Objective:   Physical Exam Vitals reviewed.  Constitutional:      General: She is not in acute distress.    Appearance: Normal appearance. She is not ill-appearing.  HENT:     Head: Normocephalic and atraumatic.  Cardiovascular:     Pulses: Normal pulses.  Skin:    General: Skin is warm and dry.     Findings: Bruising (bruising of L medial lower leg overlying 4cm x 3 cm ovoid hematoma that is exquisitely TTP) present.  Neurological:     General: No focal deficit present.     Mental Status: She is alert and oriented to person, place, and time.  Psychiatric:        Mood and Affect: Mood normal.        Behavior: Behavior normal.        Thought Content: Thought content normal.           Assessment & Plan:  Traumatic hematoma of L lower leg- new.  Injury occurred 17 days ago.  Area is still very painful, swollen.  Bruising is surprisingly minimal.  Given degree of pain will get xray to assess for possible tibial fx.  Reviewed supportive care and red flags that should prompt return.  Pt expressed understanding and is in agreement w/ plan.

## 2020-07-18 NOTE — Patient Instructions (Signed)
Go to [facility] to get your xray We'll call you with those results HEAT the leg Elevate when possible Consider an ACE wrap for gentle compression Call with any questions or concerns Hang in there!!!

## 2020-08-10 ENCOUNTER — Other Ambulatory Visit: Payer: Self-pay

## 2020-08-10 ENCOUNTER — Telehealth (INDEPENDENT_AMBULATORY_CARE_PROVIDER_SITE_OTHER): Payer: Medicare Other | Admitting: Physician Assistant

## 2020-08-10 ENCOUNTER — Encounter: Payer: Self-pay | Admitting: Physician Assistant

## 2020-08-10 VITALS — Temp 99.0°F

## 2020-08-10 DIAGNOSIS — K5792 Diverticulitis of intestine, part unspecified, without perforation or abscess without bleeding: Secondary | ICD-10-CM | POA: Diagnosis not present

## 2020-08-10 MED ORDER — METRONIDAZOLE 500 MG PO TABS
500.0000 mg | ORAL_TABLET | Freq: Three times a day (TID) | ORAL | 0 refills | Status: DC
Start: 2020-08-10 — End: 2020-08-17

## 2020-08-10 MED ORDER — CIPROFLOXACIN HCL 500 MG PO TABS
500.0000 mg | ORAL_TABLET | Freq: Two times a day (BID) | ORAL | 0 refills | Status: DC
Start: 2020-08-10 — End: 2020-08-17

## 2020-08-10 NOTE — Progress Notes (Signed)
  Virtual Visit via Telephone Note  I connected with Sarah [name] on 08/10/20 at  9:00 AM EDT by telephone and verified that I am speaking with the correct person using two identifiers.  Location: Patient: Health and safety inspector) Provider: [employer]   I discussed the limitations, risks, security and privacy concerns of performing an evaluation and management service by telephone and the availability of in person appointments. I also discussed with the patient that there may be a patient responsible charge related to this service. The patient expressed understanding and agreed to proceed.  History of Present Illness: Patient notes 2 weeks of fluctuation in her bowel habits with loose stools in the morning. Over the past couple of days has noted LLQ abdominal pain with nausea and low-garde fever (last night only). Has been eating very poorly recently and not hydrating as well. Has history of diverticulitis and this feels identical to prior episodes. Denies any melena, hematochezia or tenesmus. Is s/p hysterectomy, appendectomy and cholecystectomy.    Observations/Objective: No labored breathing.  Speech is clear and coherent with logical content.  Patient is alert and oriented at baseline.    Assessment and Plan: 1. Acute diverticulitis Prior history. Classic symptoms and identical symptoms for her when compared to prior episodes. Low-grade fever but no vomiting, blood in stool, etc. Will start empiric treatment with Cipro and Flagyl. Liquid diet recommended until symptoms are improved. Strict ER precautions discussed with patient who voiced understanding and agreement with the plan.   Follow Up Instructions: I discussed the assessment and treatment plan with the patient. The patient was provided an opportunity to ask questions and all were answered. The patient agreed with the plan and demonstrated an understanding of the instructions.   The patient was advised to call back or seek  an in-person evaluation if the symptoms worsen or if the condition fails to improve as anticipated.  I provided 10 minutes of non-face-to-face time during this encounter.   Leeanne Rio, PA-C

## 2020-08-10 NOTE — Patient Instructions (Signed)
Instructions sent to MyChart

## 2020-08-11 ENCOUNTER — Other Ambulatory Visit: Payer: Self-pay

## 2020-08-11 ENCOUNTER — Other Ambulatory Visit: Payer: Medicare Other

## 2020-08-11 DIAGNOSIS — Z20822 Contact with and (suspected) exposure to covid-19: Secondary | ICD-10-CM

## 2020-08-13 LAB — SARS-COV-2, NAA 2 DAY TAT

## 2020-08-13 LAB — NOVEL CORONAVIRUS, NAA: SARS-CoV-2, NAA: NOT DETECTED

## 2020-08-16 MED ORDER — NYSTATIN 100000 UNIT/ML MT SUSP: 5.0000 mL | Freq: Four times a day (QID) | OROMUCOSAL | 0 refills | Status: DC

## 2020-08-17 ENCOUNTER — Ambulatory Visit (INDEPENDENT_AMBULATORY_CARE_PROVIDER_SITE_OTHER): Payer: Medicare Other | Admitting: Physician Assistant

## 2020-08-17 ENCOUNTER — Other Ambulatory Visit: Payer: Self-pay

## 2020-08-17 ENCOUNTER — Encounter (HOSPITAL_BASED_OUTPATIENT_CLINIC_OR_DEPARTMENT_OTHER): Payer: Self-pay

## 2020-08-17 ENCOUNTER — Ambulatory Visit (HOSPITAL_BASED_OUTPATIENT_CLINIC_OR_DEPARTMENT_OTHER)
Admission: RE | Admit: 2020-08-17 | Discharge: 2020-08-17 | Disposition: A | Discharge status: Home / Self Care | Payer: Medicare Other | Source: Ambulatory Visit | Attending: Physician Assistant | Admitting: Physician Assistant

## 2020-08-17 ENCOUNTER — Encounter: Payer: Self-pay | Admitting: Physician Assistant

## 2020-08-17 VITALS — BP 118/80 | HR 98 | Temp 98.1°F | Resp 16 | Ht 64.0 in | Wt 146.0 lb

## 2020-08-17 DIAGNOSIS — R1032 Left lower quadrant pain: Secondary | ICD-10-CM | POA: Diagnosis not present

## 2020-08-17 LAB — CBC WITH DIFFERENTIAL/PLATELET
Basophils Absolute: 0 10*3/uL (ref 0.0–0.1)
Basophils Relative: 0.5 % (ref 0.0–3.0)
Eosinophils Absolute: 0.3 10*3/uL (ref 0.0–0.7)
Eosinophils Relative: 3.5 % (ref 0.0–5.0)
HCT: 39.4 % (ref 36.0–46.0)
Hemoglobin: 13.3 g/dL (ref 12.0–15.0)
Lymphocytes Relative: 12.9 % (ref 12.0–46.0)
Lymphs Abs: 1 10*3/uL (ref 0.7–4.0)
MCHC: 33.8 g/dL (ref 30.0–36.0)
MCV: 89 fl (ref 78.0–100.0)
Monocytes Absolute: 0.6 10*3/uL (ref 0.1–1.0)
Monocytes Relative: 8.4 % (ref 3.0–12.0)
Neutro Abs: 5.6 10*3/uL (ref 1.4–7.7)
Neutrophils Relative %: 74.7 % (ref 43.0–77.0)
Platelets: 234 10*3/uL (ref 150.0–400.0)
RBC: 4.42 Mil/uL (ref 3.87–5.11)
RDW: 13.7 % (ref 11.5–15.5)
WBC: 7.5 10*3/uL (ref 4.0–10.5)

## 2020-08-17 LAB — URINALYSIS, ROUTINE W REFLEX MICROSCOPIC
Bilirubin Urine: NEGATIVE
Hgb urine dipstick: NEGATIVE
Ketones, ur: NEGATIVE
Leukocytes,Ua: NEGATIVE
Nitrite: NEGATIVE
RBC / HPF: NONE SEEN (ref 0–?)
Specific Gravity, Urine: <=1.005 — AB (ref 1.000–1.030)
Total Protein, Urine: NEGATIVE
Urine Glucose: NEGATIVE
Urobilinogen, UA: 0.2 (ref 0.0–1.0)
pH: 6.5 (ref 5.0–8.0)

## 2020-08-17 LAB — COMPREHENSIVE METABOLIC PANEL
ALT: 12 U/L (ref 0–35)
AST: 14 U/L (ref 0–37)
Albumin: 4.3 g/dL (ref 3.5–5.2)
Alkaline Phosphatase: 63 U/L (ref 39–117)
BUN: 12 mg/dL (ref 6–23)
CO2: 29 mEq/L (ref 19–32)
Calcium: 9.4 mg/dL (ref 8.4–10.5)
Chloride: 101 mEq/L (ref 96–112)
Creatinine, Ser: 0.89 mg/dL (ref 0.40–1.20)
GFR: 61.92 mL/min (ref >60.00)
Glucose, Bld: 78 mg/dL (ref 70–99)
Potassium: 4.1 mEq/L (ref 3.5–5.1)
Sodium: 140 mEq/L (ref 135–145)
Total Bilirubin: 0.4 mg/dL (ref 0.2–1.2)
Total Protein: 6.9 g/dL (ref 6.0–8.3)

## 2020-08-17 MED ORDER — IOHEXOL 300 MG/ML  SOLN
100.0000 mL | Freq: Once | INTRAMUSCULAR | Status: AC | PRN
  Administered 2020-08-17: 100 mL via INTRAVENOUS

## 2020-08-17 NOTE — Progress Notes (Signed)
Patient presents to clinic today c/o recurrence of a focal left lower quadrant pain sometimes sharp but mostly feeling like a cramping sensation.  As at site of surgical incision from prior sacral colpopexy.  Was recently evaluated and treated for suspected diverticulitis with Cipro and Flagyl with resolution of bowel changes and diffuse left lower quadrant tenderness.  Patient eating and drinking well without issue.  Denies fever, nausea, vomiting.  Denies any changes to bladder habits.  Notes BMs are normal without blood or mucus.  Past Medical History:  Diagnosis Date   Allergy    Arthritis    Asthma    Cancer ([place])    basal cell cancer--nose   Cataract    left eye and stable    Cervical disc disease    bulging   Diverticulitis    Diverticulosis    GERD (gastroesophageal reflux disease)    Hyperlipidemia    Hypertension    Osteopenia    Osteoporosis    PONV (postoperative nausea and vomiting)    Status post dilation of esophageal narrowing     Current Outpatient Medications on File Prior to Visit  Medication Sig Dispense Refill   Ascorbic Acid (VITAMIN C PO) Take by mouth.     atorvastatin (LIPITOR) 10 MG tablet TAKE 1 TABLET(10 MG) BY MOUTH EVERY 3 DAYS 90 tablet 1   beta carotene w/minerals (OCUVITE) tablet Take 1 tablet by mouth daily.     Calcium Citrate (CITRACAL PO) Take 2 tablets by mouth daily.     cetirizine (ZYRTEC) 10 MG tablet Take 1 tablet (10 mg total) by mouth daily. 30 tablet 11   clobetasol ointment (TEMOVATE) 0.05 % Apply topically 2 (two) times daily as needed. Use for 2 weeks at time as needed. 30 g 0   Coenzyme Q10 (COQ10) 200 MG CAPS Take 1 tablet by mouth daily.     EPIPEN 2-PAK 0.3 MG/0.3ML SOAJ injection Inject 1 mg into the muscle as needed. Reported on 06/28/2016     Ergocalciferol (VITAMIN D2) 400 units TABS Take 1 capsule by mouth daily.     esomeprazole (NEXIUM) 40 MG capsule TAKE 1 CAPSULE(40 MG) BY MOUTH DAILY 90  capsule 1   fish oil-omega-3 fatty acids 1000 MG capsule Take 2 g by mouth daily.     fluticasone (FLONASE) 50 MCG/ACT nasal spray Place 2 sprays into both nostrils daily.     lactobacillus acidophilus (BACID) TABS tablet Take 2 tablets by mouth 2 (two) times a week.     Multiple Vitamin (MULTIVITAMIN) capsule Take 1 capsule by mouth daily.     nystatin (MYCOSTATIN) 100000 UNIT/ML suspension Take 5 mLs (500,000 Units total) by mouth 4 (four) times daily. Swish and spit. 60 mL 0   PROAIR HFA 108 (90 Base) MCG/ACT inhaler INL 2 PFS PO Q 4 TO 6 H PRN  0   valsartan (DIOVAN) 80 MG tablet TAKE 1 TABLET(80 MG) BY MOUTH DAILY 90 tablet 1   No current facility-administered medications on file prior to visit.    Allergies  Allergen Reactions   Evista [Raloxifene]     Respiratory infections, shortness of breath(not anaphylaxis), leg heaviness.   Fosamax [Alendronate] Other (See Comments)    "Due to Esophagus problems"--narrow   Singulair [Montelukast] Other (See Comments)    unknown    Family History  Problem Relation Age of Onset   Hypertension Mother    Diabetes Father    Hypertension Father    Heart disease Father  Diabetes Sister    Hypertension Sister    Schizophrenia Sister    Thyroid disease Sister    Heart disease Sister    Throat cancer Paternal Uncle        smoker   Lung cancer Cousin        smoker   Colon cancer Neg Hx    Pancreatic cancer Neg Hx    Stomach cancer Neg Hx    Liver disease Neg Hx    Colon polyps Neg Hx    Esophageal cancer Neg Hx    Rectal cancer Neg Hx     Social History   Socioeconomic History   Marital status: Married    Spouse name: Not on file   Number of children: 1   Years of education: Not on file   Highest education level: Not on file  Occupational History   Occupation: retired  Tobacco Use   Smoking status: Never Smoker   Smokeless tobacco: Never Used  Scientific laboratory technician Use: Never used    Substance and Sexual Activity   Alcohol use: Not Currently   Drug use: No   Sexual activity: Yes    Partners: Male    Birth control/protection: Other-see comments, Post-menopausal, Surgical    Comment: TVH--Still has ovaries  Other Topics Concern   Not on file  Social History Narrative   Not on file   Social Determinants of Health   Financial Resource Strain:    Difficulty of Paying Living Expenses: Not on file  Food Insecurity:    Worried About Charity fundraiser in the Last Year: Not on file   [employer] of Food in the Last Year: Not on file  Transportation Needs:    Lack of Transportation (Medical): Not on file   Lack of Transportation (Non-Medical): Not on file  Physical Activity:    Days of Exercise per Week: Not on file   Minutes of Exercise per Session: Not on file  Stress:    Feeling of Stress : Not on file  Social Connections:    Frequency of Communication with Friends and Family: Not on file   Frequency of Social Gatherings with Friends and Family: Not on file   Attends Religious Services: Not on file   Active Member of Clubs or Organizations: Not on file   Attends Archivist Meetings: Not on file   Marital Status: Not on file    Review of Systems - See HPI.  All other ROS are negative.  BP 118/80    Pulse 98    Temp 98.1 F (36.7 C) (Temporal)    Resp 16    Ht '5\' 4"'  (1.626 m)    Wt 146 lb (66.2 kg)    LMP 12/16/1993 (Approximate)    SpO2 98%    BMI 25.06 kg/m   Physical Exam Vitals reviewed.  HENT:     Head: Normocephalic and atraumatic.     Right Ear: Tympanic membrane, ear canal and external ear normal.     Left Ear: Tympanic membrane, ear canal and external ear normal.     Nose: Nose normal. No mucosal edema.     Mouth/Throat:     Pharynx: Uvula midline. No oropharyngeal exudate or posterior oropharyngeal erythema.  Eyes:     Conjunctiva/sclera: Conjunctivae normal.     Pupils: Pupils are equal, round, and reactive to  light.  Neck:     Thyroid: No thyromegaly.  Cardiovascular:     Rate and Rhythm: Normal  rate and regular rhythm.     Heart sounds: Normal heart sounds.  Pulmonary:     Effort: Pulmonary effort is normal. No respiratory distress.     Breath sounds: Normal breath sounds. No wheezing or rales.  Abdominal:     General: Bowel sounds are normal. There is no distension.     Palpations: Abdomen is soft. There is no mass.     Tenderness: There is abdominal tenderness (focal LLQ tenderness without rebound or guarding). There is no guarding or rebound.  Musculoskeletal:     Cervical back: Neck supple.  Lymphadenopathy:     Cervical: No cervical adenopathy.  Skin:    General: Skin is warm and dry.     Findings: No rash.  Neurological:     Mental Status: She is alert and oriented to person, place, and time.     Cranial Nerves: No cranial nerve deficit.     Recent Results (from the past 2160 hour(s))  Lipid panel     Status: Abnormal   Collection Time: 05/25/20  9:13 AM  Result Value Ref Range   Cholesterol 195 0 - 200 mg/dL    Comment: ATP III Classification       Desirable:  < 200 mg/dL               Borderline High:  200 - 239 mg/dL          High:  > = 240 mg/dL   Triglycerides 125.0 0 - 149 mg/dL    Comment: Normal:  <150 mg/dLBorderline High:  150 - 199 mg/dL   HDL 67.80 >39.00 mg/dL   VLDL 25.0 0.0 - 40.0 mg/dL   LDL Cholesterol 103 (H) 0 - 99 mg/dL   Total CHOL/HDL Ratio 3     Comment:                Men          Women1/2 Average Risk     3.4          3.3Average Risk          5.0          4.42X Average Risk          9.6          7.13X Average Risk          15.0          11.0                       NonHDL 127.66     Comment: NOTE:  Non-HDL goal should be 30 mg/dL higher than patient's LDL goal (i.e. LDL goal of < 70 mg/dL, would have non-HDL goal of < 100 mg/dL)  Basic metabolic panel     Status: Abnormal   Collection Time: 05/25/20  9:13 AM  Result Value Ref Range   Sodium 141 135  - 145 mEq/L   Potassium 4.4 3.5 - 5.1 mEq/L   Chloride 104 96 - 112 mEq/L   CO2 29 19 - 32 mEq/L   Glucose, Bld 77 70 - 99 mg/dL   BUN 19 6 - 23 mg/dL   Creatinine, Ser 0.92 0.40 - 1.20 mg/dL   GFR 59.63 (L) >60.00 mL/min   Calcium 9.7 8.4 - 10.5 mg/dL  TSH     Status: None   Collection Time: 05/25/20  9:13 AM  Result Value Ref Range   TSH 1.41 0.35 - 4.50 uIU/mL  Hepatic  function panel     Status: None   Collection Time: 05/25/20  9:13 AM  Result Value Ref Range   Total Bilirubin 0.5 0.2 - 1.2 mg/dL   Bilirubin, Direct 0.1 0.0 - 0.3 mg/dL   Alkaline Phosphatase 60 39 - 117 U/L   AST 21 0 - 37 U/L   ALT 18 0 - 35 U/L   Total Protein 6.9 6.0 - 8.3 g/dL   Albumin 4.5 3.5 - 5.2 g/dL  CBC with Differential/Platelet     Status: Abnormal   Collection Time: 05/25/20  9:13 AM  Result Value Ref Range   WBC 3.4 (L) 4.0 - 10.5 K/uL   RBC 4.35 3.87 - 5.11 Mil/uL   Hemoglobin 13.2 12.0 - 15.0 g/dL   HCT 38.3 36 - 46 %   MCV 88.0 78.0 - 100.0 fl   MCHC 34.4 30.0 - 36.0 g/dL   RDW 14.1 11.5 - 15.5 %   Platelets 188.0 150 - 400 K/uL   Neutrophils Relative % 45.7 43 - 77 %   Lymphocytes Relative 39.9 12 - 46 %   Monocytes Relative 9.4 3 - 12 %   Eosinophils Relative 3.6 0 - 5 %   Basophils Relative 1.4 0 - 3 %   Neutro Abs 1.6 1.4 - 7.7 K/uL   Lymphs Abs 1.4 0.7 - 4.0 K/uL   Monocytes Absolute 0.3 0 - 1 K/uL   Eosinophils Absolute 0.1 0 - 0 K/uL   Basophils Absolute 0.0 0 - 0 K/uL  Novel Coronavirus, NAA ([facility])     Status: None   Collection Time: 08/11/20 10:30 AM   Specimen: Nasopharyngeal(NP) swabs in vial transport medium   Nasopharynge  Screenin  Result Value Ref Range   SARS-CoV-2, NAA Not Detected Not Detected    Comment: This nucleic acid amplification test was developed and its performance characteristics determined by [employer]. Nucleic acid amplification tests include RT-PCR and TMA. This test has not been FDA cleared or approved. This test has been  authorized by FDA under an Emergency Use Authorization (EUA). This test is only authorized for the duration of time the declaration that circumstances exist justifying the authorization of the emergency use of in vitro diagnostic tests for detection of SARS-CoV-2 virus and/or diagnosis of COVID-19 infection under section 564(b)(1) of the Act, 21 U.S.C. 161WRU-0(A) (1), unless the authorization is terminated or revoked sooner. When diagnostic testing is negative, the possibility of a false negative result should be considered in the context of a patient's recent exposures and the presence of clinical signs and symptoms consistent with COVID-19. An individual without symptoms of COVID-19 and who is not shedding SARS-CoV-2 virus wo uld expect to have a negative (not detected) result in this assay.   SARS-COV-2, NAA 2 DAY TAT     Status: None   Collection Time: 08/11/20 10:30 AM   Nasopharynge  Screenin  Result Value Ref Range   SARS-CoV-2, NAA 2 DAY TAT Performed    Assessment/Plan: 1. LLQ pain Recently treated for diverticulitis with Cipro and Flagyl.  Noted resolution of symptoms but now with recurrence of left lower quadrant pain, more focal in nature without rebound or guarding.  Afebrile.  No stool or bladder changes.  Will check labs today to further assess.  We will also proceed with CT to see if there is truly diverticulitis present or if this is related to her surgical mesh is focal tenderness noted today is at surgical site.  Dietary recommendations and OTC medications  reviewed.  Strict ER precautions reviewed with patient. - CBC w/Diff - Comp Met (CMET) - Urinalysis, Routine w reflex microscopic - CT Abdomen Pelvis W Contrast; Future  This visit occurred during the SARS-CoV-2 public health emergency.  Safety protocols were in place, including screening questions prior to the visit, additional usage of staff PPE, and extensive cleaning of exam room while observing appropriate  contact time as indicated for disinfecting solutions.     Leeanne Rio, PA-C

## 2020-08-17 NOTE — Addendum Note (Signed)
Addended by: Fritz Pickerel on: 08/17/2020 11:08 AM   Modules accepted: Orders

## 2020-08-17 NOTE — Patient Instructions (Addendum)
Please go to the lab today for blood work.  I will call you with your results. We will alter treatment regimen(s) if indicated by your results.   Please go up front to speak with Sarah [name] about getting your CT scheduled.  Keep hydrated. Tylenol for pain. If anything acutely worsens before we can get results in, please be seen at nearest ER.

## 2020-10-11 ENCOUNTER — Other Ambulatory Visit: Payer: Self-pay

## 2020-10-11 ENCOUNTER — Ambulatory Visit (INDEPENDENT_AMBULATORY_CARE_PROVIDER_SITE_OTHER): Payer: Medicare Other | Admitting: Orthopaedic Surgery

## 2020-10-11 ENCOUNTER — Encounter: Payer: Self-pay | Admitting: Orthopaedic Surgery

## 2020-10-11 ENCOUNTER — Ambulatory Visit: Payer: Self-pay

## 2020-10-11 VITALS — Ht 64.0 in | Wt 145.0 lb

## 2020-10-11 DIAGNOSIS — M79605 Pain in left leg: Secondary | ICD-10-CM | POA: Diagnosis not present

## 2020-10-11 NOTE — Progress Notes (Signed)
Office Visit Note   Patient: Sarah [name]           Date of Birth: 1946/08/09           MRN: [id number] Visit Date: 10/11/2020              Requested by: Midge Minium, MD [address] [place],  [place] [address] PCP: Midge Minium, MD   Assessment & Plan: Visit Diagnoses:  1. Pain in left leg     Plan: Sarah [name] sustained an injury to the lower left leg in mid July of this year.  She struck her leg against a metal bar.  She initially had a fair amount of pain swelling and black and blue discoloration.  X-rays were obtained about 10 days later through another office and were negative.  She still was tender to the touch and pain along the medial aspect of the lower leg.  She does not have the swelling nor does it hurt to walk but still has some pain to palpation and with certain motions.  She is not had any numbness or tingling.  Repeat x-rays were negative.  I suspect she has had a hematoma and still some local discomfort in the soft tissue.  She may have actually bruise though the distal tibia but without any evidence of abnormality by film.  There is also a possibility that she had some pathology of the posterior tibial tendon which I discussed with her in detail.  I thought it might be worthwhile to obtain an MRI scan but she notes that she would like to give it more time and felt comfortable knowing that there was nothing on the x-ray  Follow-Up Instructions: Return if symptoms worsen or fail to improve.   Orders:  Orders Placed This Encounter  Procedures  . XR Tibia/Fibula Left   No orders of the defined types were placed in this encounter.     Procedures: No procedures performed   Clinical Data: No additional findings.   Subjective: Chief Complaint  Patient presents with  . Left Leg - Pain  Patient presents today for left lower leg pain. She states that she struck her leg with a metal bar in mid July of this year. She had immediate pain located in  her medial lower leg, just above the ankle. She said that it became swollen. She spoke with her PCP and had x-rays 10days after the injury. She said that the x-rays were negative. She continues to have pain in that same area, three months later. She said that it is tender to the touch, but the swelling is gone. She does not have pain to walk. She does not take any pain medication.   HPI  Review of Systems   Objective: Vital Signs: Ht 5\' 4"  (1.626 m)   Wt 145 lb (65.8 kg)   LMP 12/16/1993 (Approximate)   BMI 24.89 kg/m   Physical Exam Constitutional:      Appearance: She is well-developed.  Eyes:     Pupils: Pupils are equal, round, and reactive to light.  Pulmonary:     Effort: Pulmonary effort is normal.  Skin:    General: Skin is warm and dry.  Neurological:     Mental Status: She is alert and oriented to person, place, and time.  Psychiatric:        Behavior: Behavior normal.     Ortho Exam awake alert and oriented x3.  Comfortable sitting.  There  was an area of tenderness along the distal third of the left tibia associated with some skin discoloration consistent with what I suspect was a hematoma.  Neurologically intact.  No Tinel's in that area.  No swelling distally.  Also some tenderness along the posterior tibial tendon but without swelling.  She does pronate.  Specialty Comments:  No specialty comments available.  Imaging: XR Tibia/Fibula Left  Result Date: 10/11/2020 Films of the left tib-fib were obtained in 2 projections.  No evidence of any abnormality in the distal third where the patient is experiencing some discomfort along the tibia.    PMFS History: Patient Active Problem List   Diagnosis Date Noted  . Pain in left leg 10/11/2020  . Overweight (BMI 25.0-29.9) 05/25/2020  . Diverticulitis 07/09/2017  . Primary osteoarthritis of both hands 12/23/2016  . Primary osteoarthritis of both knees 12/23/2016  . Postural kyphosis of thoracic region 12/23/2016   . DJD (degenerative joint disease), cervical 12/23/2016  . History of gastroesophageal reflux (GERD) 12/23/2016  . History of fracture of left hip 12/23/2016  . Physical exam 11/04/2016  . Environmental and seasonal allergies 06/27/2016  . Esophageal dysphagia 06/03/2016  . Osteoporosis 08/03/2014  . Hyperlipidemia 11/06/2013  . Hypertension 11/06/2013   Past Medical History:  Diagnosis Date  . Allergy   . Arthritis   . Asthma   . Cancer ([place])    basal cell cancer--nose  . Cataract    left eye and stable   . Cervical disc disease    bulging  . Diverticulitis   . Diverticulosis   . GERD (gastroesophageal reflux disease)   . Hyperlipidemia   . Hypertension   . Osteopenia   . Osteoporosis   . PONV (postoperative nausea and vomiting)   . Status post dilation of esophageal narrowing     Family History  Problem Relation Age of Onset  . Hypertension Mother   . Diabetes Father   . Hypertension Father   . Heart disease Father   . Diabetes Sister   . Hypertension Sister   . Schizophrenia Sister   . Thyroid disease Sister   . Heart disease Sister   . Throat cancer Paternal Uncle        smoker  . Lung cancer Cousin        smoker  . Colon cancer Neg Hx   . Pancreatic cancer Neg Hx   . Stomach cancer Neg Hx   . Liver disease Neg Hx   . Colon polyps Neg Hx   . Esophageal cancer Neg Hx   . Rectal cancer Neg Hx     Past Surgical History:  Procedure Laterality Date  . ABDOMINAL HYSTERECTOMY  1995   TVH--still has ovaries  . ABDOMINAL SACROCOLPOPEXY  2007   Halban culdoplasty & Burch procedure--Dr. Quincy Simmonds  . APPENDECTOMY     Dr. Hassell Done  . arthroscopic shoulder Left    Dr. Durward Fortes, bone spur  . BLADDER SUSPENSION    . CERVICAL FUSION     C-4, C-5 fusion  . CHOLECYSTECTOMY     Dr. Georgina Quint  . ESOPHAGEAL DILATION    . HIP ARTHROPLASTY Left 11/07/2013   Procedure: ARTHROPLASTY BIPOLAR HIP;  Surgeon: Garald Balding, MD;  Location: [facility];  Service:  Orthopedics;  Laterality: Left;  . KNEE ARTHROSCOPY Bilateral    Dr. Durward Fortes  . OTHER SURGICAL HISTORY  2011   posterior Colporrhaphy with Xenform biological graft--Dr. Quincy Simmonds  . RECTOCELE REPAIR  2011   posterior colporrhaphy with  Xenform biological graft--Dr. Quincy Simmonds  . UPPER GASTROINTESTINAL ENDOSCOPY     Social History   Occupational History  . Occupation: retired  Tobacco Use  . Smoking status: Never Smoker  . Smokeless tobacco: Never Used  Vaping Use  . Vaping Use: Never used  Substance and Sexual Activity  . Alcohol use: Not Currently  . Drug use: No  . Sexual activity: Yes    Partners: Male    Birth control/protection: Other-see comments, Post-menopausal, Surgical    Comment: TVH--Still has ovaries

## 2020-10-12 ENCOUNTER — Telehealth: Payer: Self-pay | Admitting: Orthopaedic Surgery

## 2020-10-12 ENCOUNTER — Other Ambulatory Visit: Payer: Self-pay

## 2020-10-12 DIAGNOSIS — M79605 Pain in left leg: Secondary | ICD-10-CM

## 2020-10-12 NOTE — Telephone Encounter (Signed)
Ordered and notified patient

## 2020-10-12 NOTE — Telephone Encounter (Signed)
Please advise 

## 2020-10-12 NOTE — Telephone Encounter (Signed)
MRI of distal left leg and ankle

## 2020-10-12 NOTE — Telephone Encounter (Signed)
Patient called advised she would like to get set up for the MRI. The number to contact patient is [phone]

## 2020-10-13 ENCOUNTER — Encounter: Payer: Self-pay | Admitting: Orthopaedic Surgery

## 2020-10-31 ENCOUNTER — Ambulatory Visit
Admission: RE | Admit: 2020-10-31 | Discharge: 2020-10-31 | Disposition: A | Discharge status: Home / Self Care | Payer: Medicare Other | Source: Ambulatory Visit | Attending: Orthopaedic Surgery | Admitting: Orthopaedic Surgery

## 2020-10-31 ENCOUNTER — Other Ambulatory Visit: Payer: Self-pay

## 2020-10-31 DIAGNOSIS — M79605 Pain in left leg: Secondary | ICD-10-CM

## 2020-11-02 ENCOUNTER — Other Ambulatory Visit: Payer: Self-pay

## 2020-11-02 ENCOUNTER — Ambulatory Visit (INDEPENDENT_AMBULATORY_CARE_PROVIDER_SITE_OTHER): Payer: Medicare Other | Admitting: Orthopaedic Surgery

## 2020-11-02 ENCOUNTER — Encounter: Payer: Self-pay | Admitting: Orthopaedic Surgery

## 2020-11-02 VITALS — Ht 64.0 in | Wt 145.0 lb

## 2020-11-02 DIAGNOSIS — M79605 Pain in left leg: Secondary | ICD-10-CM | POA: Diagnosis not present

## 2020-11-02 NOTE — Progress Notes (Signed)
Office Visit Note   Patient: Sarah [name]           Date of Birth: July 31, 1946           MRN: [id number] Visit Date: 11/02/2020              Requested by: Midge Minium, MD [address] [place],  [place] [address] PCP: Midge Minium, MD   Assessment & Plan: Visit Diagnoses:  1. Pain in left leg     Plan: Sarah [name] had an MRI scan of her left tibia and fibula the area of greatest tenderness with was marked with a skin marker at the anteromedial aspect of the lower leg in the region of the mid to distal tibia.  There was minimal focal low signal induration within the subcutaneous fat underlying the skin marker but nothing else of significance.  This most likely represented posttraumatic change with mild residual fibrosis.  Discussed this with in detail she felt relieved to know that there was nothing of any significance but activities as tolerated.  I had like to see her back at some point in the future if she continues to have a problem or it should worsen.  I suspect this will just simply resolve on its own  Follow-Up Instructions: Return if symptoms worsen or fail to improve.   Orders:  No orders of the defined types were placed in this encounter.  No orders of the defined types were placed in this encounter.     Procedures: No procedures performed   Clinical Data: No additional findings.   Subjective: Chief Complaint  Patient presents with  . Left Leg - Follow-up    MRI review  Patient presents today for follow up on her left leg. She had an MRI of her tib/fib on 10/31/2020.  No changes since her last office visit.  The pain is localized to the distal third of the left leg where she had the injury  HPI  Review of Systems   Objective: Vital Signs: Ht 5\' 4"  (1.626 m)   Wt 145 lb (65.8 kg)   LMP 12/16/1993 (Approximate)   BMI 24.89 kg/m   Physical Exam Constitutional:      Appearance: She is well-developed.  Eyes:     Pupils: Pupils are  equal, round, and reactive to light.  Pulmonary:     Effort: Pulmonary effort is normal.  Skin:    General: Skin is warm and dry.  Neurological:     Mental Status: She is alert and oriented to person, place, and time.  Psychiatric:        Behavior: Behavior normal.     Ortho Exam awake alert and oriented x3.  Comfortable sitting.  No change in her exam from prior evaluation she does have an area of tenderness the distal aspect of her left leg overlying the tibial diaphysis.  No skin changes.  No induration or mass formation.  Neurologically intact Specialty Comments:  No specialty comments available.  Imaging: No results found.   PMFS History: Patient Active Problem List   Diagnosis Date Noted  . Pain in left leg 10/11/2020  . Overweight (BMI 25.0-29.9) 05/25/2020  . Diverticulitis 07/09/2017  . Primary osteoarthritis of both hands 12/23/2016  . Primary osteoarthritis of both knees 12/23/2016  . Postural kyphosis of thoracic region 12/23/2016  . DJD (degenerative joint disease), cervical 12/23/2016  . History of gastroesophageal reflux (GERD) 12/23/2016  . History of fracture of left hip  12/23/2016  . Physical exam 11/04/2016  . Environmental and seasonal allergies 06/27/2016  . Esophageal dysphagia 06/03/2016  . Osteoporosis 08/03/2014  . Hyperlipidemia 11/06/2013  . Hypertension 11/06/2013   Past Medical History:  Diagnosis Date  . Allergy   . Arthritis   . Asthma   . Cancer ([place])    basal cell cancer--nose  . Cataract    left eye and stable   . Cervical disc disease    bulging  . Diverticulitis   . Diverticulosis   . GERD (gastroesophageal reflux disease)   . Hyperlipidemia   . Hypertension   . Osteopenia   . Osteoporosis   . PONV (postoperative nausea and vomiting)   . Status post dilation of esophageal narrowing     Family History  Problem Relation Age of Onset  . Hypertension Mother   . Diabetes Father   . Hypertension Father   . Heart disease  Father   . Diabetes Sister   . Hypertension Sister   . Schizophrenia Sister   . Thyroid disease Sister   . Heart disease Sister   . Throat cancer Paternal Uncle        smoker  . Lung cancer Cousin        smoker  . Colon cancer Neg Hx   . Pancreatic cancer Neg Hx   . Stomach cancer Neg Hx   . Liver disease Neg Hx   . Colon polyps Neg Hx   . Esophageal cancer Neg Hx   . Rectal cancer Neg Hx     Past Surgical History:  Procedure Laterality Date  . ABDOMINAL HYSTERECTOMY  1995   TVH--still has ovaries  . ABDOMINAL SACROCOLPOPEXY  2007   Halban culdoplasty & Burch procedure--Dr. Quincy Simmonds  . APPENDECTOMY     Dr. Hassell Done  . arthroscopic shoulder Left    Dr. Durward Fortes, bone spur  . BLADDER SUSPENSION    . CERVICAL FUSION     C-4, C-5 fusion  . CHOLECYSTECTOMY     Dr. Georgina Quint  . ESOPHAGEAL DILATION    . HIP ARTHROPLASTY Left 11/07/2013   Procedure: ARTHROPLASTY BIPOLAR HIP;  Surgeon: Garald Balding, MD;  Location: [facility];  Service: Orthopedics;  Laterality: Left;  . KNEE ARTHROSCOPY Bilateral    Dr. Durward Fortes  . OTHER SURGICAL HISTORY  2011   posterior Colporrhaphy with Xenform biological graft--Dr. Quincy Simmonds  . RECTOCELE REPAIR  2011   posterior colporrhaphy with Xenform biological graft--Dr. Quincy Simmonds  . UPPER GASTROINTESTINAL ENDOSCOPY     Social History   Occupational History  . Occupation: retired  Tobacco Use  . Smoking status: Never Smoker  . Smokeless tobacco: Never Used  Vaping Use  . Vaping Use: Never used  Substance and Sexual Activity  . Alcohol use: Not Currently  . Drug use: No  . Sexual activity: Yes    Partners: Male    Birth control/protection: Other-see comments, Post-menopausal, Surgical    Comment: TVH--Still has ovaries

## 2020-11-24 NOTE — Progress Notes (Signed)
Subjective:   Sarah [name] is a 73 y.o. female who presents for Medicare Annual (Subsequent) preventive examination.  Review of Systems     Cardiac Risk Factors include: advanced age (>24men, >68 women);hypertension;dyslipidemia     Objective:    Today's Vitals   11/27/20 0802  BP: 138/70  Pulse: 76  Resp: 16  Temp: 97.7 F (36.5 C)  TempSrc: Oral  SpO2: 98%  Weight: 147 lb 9.6 oz (67 kg)  Height: 5\' 4"  (1.626 m)   Body mass index is 25.34 kg/m.  Advanced Directives 11/27/2020 11/22/2019 11/19/2018 11/16/2018 11/12/2017 10/08/2017 06/28/2016  Does Patient Have a Medical Advance Directive? Yes Yes Yes Yes Yes Yes Yes  Type of Paramedic of [place];Living will [place];Living will [place];Living will [place];Living will [place];Living will [place];Living will -  Does patient want to make changes to medical advance directive? - Yes (MAU/Ambulatory/Procedural Areas - Information given) - No - Patient declined - - -  Copy of [place] in Chart? No - copy requested No - copy requested No - copy requested - No - copy requested - No - copy requested    Current Medications (verified) Outpatient Encounter Medications as of 11/27/2020  Medication Sig  . Ascorbic Acid (VITAMIN C PO) Take by mouth.  Marland Kitchen atorvastatin (LIPITOR) 10 MG tablet TAKE 1 TABLET(10 MG) BY MOUTH EVERY 3 DAYS  . beta carotene w/minerals (OCUVITE) tablet Take 1 tablet by mouth daily.  . Calcium Citrate (CITRACAL PO) Take 2 tablets by mouth daily.  . cetirizine (ZYRTEC) 10 MG tablet Take 1 tablet (10 mg total) by mouth daily.  . clobetasol ointment (TEMOVATE) 0.05 % Apply topically 2 (two) times daily as needed. Use for 2 weeks at time as needed.  . Coenzyme Q10 (COQ10) 200 MG CAPS Take 1 tablet by mouth daily.  Marland Kitchen EPIPEN 2-PAK 0.3 MG/0.3ML SOAJ injection Inject 1 mg into  the muscle as needed. Reported on 06/28/2016  . Ergocalciferol (VITAMIN D2) 400 units TABS Take 1 capsule by mouth daily.  Marland Kitchen esomeprazole (NEXIUM) 40 MG capsule TAKE 1 CAPSULE(40 MG) BY MOUTH DAILY  . fish oil-omega-3 fatty acids 1000 MG capsule Take 2 g by mouth daily.  . fluticasone (FLONASE) 50 MCG/ACT nasal spray Place 2 sprays into both nostrils daily.  Marland Kitchen lactobacillus acidophilus (BACID) TABS tablet Take 2 tablets by mouth 2 (two) times a week.  . Multiple Vitamin (MULTIVITAMIN) capsule Take 1 capsule by mouth daily.  Marland Kitchen PROAIR HFA 108 (90 Base) MCG/ACT inhaler INL 2 PFS PO Q 4 TO 6 H PRN  . valsartan (DIOVAN) 80 MG tablet TAKE 1 TABLET(80 MG) BY MOUTH DAILY   No facility-administered encounter medications on file as of 11/27/2020.    Allergies (verified) Evista [raloxifene], Fosamax [alendronate], and Singulair [montelukast]   History: Past Medical History:  Diagnosis Date  . Allergy   . Arthritis   . Asthma   . Cancer ([place])    basal cell cancer--nose  . Cataract    left eye and stable   . Cervical disc disease    bulging  . Diverticulitis   . Diverticulosis   . GERD (gastroesophageal reflux disease)   . Hyperlipidemia   . Hypertension   . Osteopenia   . Osteoporosis   . PONV (postoperative nausea and vomiting)   . Status post dilation of esophageal narrowing    Past Surgical History:  Procedure  Laterality Date  . ABDOMINAL HYSTERECTOMY  1995   TVH--still has ovaries  . ABDOMINAL SACROCOLPOPEXY  2007   Halban culdoplasty & Burch procedure--Dr. Quincy Simmonds  . APPENDECTOMY     Dr. Hassell Done  . arthroscopic shoulder Left    Dr. Durward Fortes, bone spur  . BLADDER SUSPENSION    . CERVICAL FUSION     C-4, C-5 fusion  . CHOLECYSTECTOMY     Dr. Georgina Quint  . ESOPHAGEAL DILATION    . HIP ARTHROPLASTY Left 11/07/2013   Procedure: ARTHROPLASTY BIPOLAR HIP;  Surgeon: Garald Balding, MD;  Location: [facility];  Service: Orthopedics;  Laterality: Left;  . KNEE ARTHROSCOPY  Bilateral    Dr. Durward Fortes  . OTHER SURGICAL HISTORY  2011   posterior Colporrhaphy with Xenform biological graft--Dr. Quincy Simmonds  . RECTOCELE REPAIR  2011   posterior colporrhaphy with Xenform biological graft--Dr. Quincy Simmonds  . UPPER GASTROINTESTINAL ENDOSCOPY     Family History  Problem Relation Age of Onset  . Hypertension Mother   . Diabetes Father   . Hypertension Father   . Heart disease Father   . Diabetes Sister   . Hypertension Sister   . Schizophrenia Sister   . Thyroid disease Sister   . Heart disease Sister   . Throat cancer Paternal Uncle        smoker  . Lung cancer Cousin        smoker  . Colon cancer Neg Hx   . Pancreatic cancer Neg Hx   . Stomach cancer Neg Hx   . Liver disease Neg Hx   . Colon polyps Neg Hx   . Esophageal cancer Neg Hx   . Rectal cancer Neg Hx    Social History   Socioeconomic History  . Marital status: Married    Spouse name: Not on file  . Number of children: 1  . Years of education: Not on file  . Highest education level: Not on file  Occupational History  . Occupation: retired  Tobacco Use  . Smoking status: Never Smoker  . Smokeless tobacco: Never Used  Vaping Use  . Vaping Use: Never used  Substance and Sexual Activity  . Alcohol use: Not Currently  . Drug use: No  . Sexual activity: Yes    Partners: Male    Birth control/protection: Other-see comments, Post-menopausal, Surgical    Comment: TVH--Still has ovaries  Other Topics Concern  . Not on file  Social History Narrative  . Not on file   Social Determinants of Health   Financial Resource Strain: Low Risk   . Difficulty of Paying Living Expenses: Not hard at all  Food Insecurity: No Food Insecurity  . Worried About Charity fundraiser in the Last Year: Never true  . Ran Out of Food in the Last Year: Never true  Transportation Needs: No Transportation Needs  . Lack of Transportation (Medical): No  . Lack of Transportation (Non-Medical): No  Physical Activity:  Insufficiently Active  . Days of Exercise per Week: 3 days  . Minutes of Exercise per Session: 30 min  Stress: No Stress Concern Present  . Feeling of Stress : Not at all  Social Connections: Socially Integrated  . Frequency of Communication with Friends and Family: More than three times a week  . Frequency of Social Gatherings with Friends and Family: More than three times a week  . Attends Religious Services: More than 4 times per year  . Active Member of Clubs or Organizations: Yes  .  Attends Archivist Meetings: More than 4 times per year  . Marital Status: Married    Tobacco Counseling Counseling given: Not Answered   Clinical Intake:  Pre-visit preparation completed: Yes  Pain : No/denies pain     Nutritional Status: BMI 25 -29 Overweight Nutritional Risks: None Diabetes: No  How often do you need to have someone help you when you read instructions, pamphlets, or other written materials from your doctor or pharmacy?: 1 - Never What is the last grade level you completed in school?: College & 1 yr post graduate  Diabetic?No  Interpreter Needed?: No  Information entered by :: Caroleen Hamman LPN   Activities of Daily Living In your present state of health, do you have any difficulty performing the following activities: 11/27/2020 07/18/2020  Hearing? N N  Vision? N N  Difficulty concentrating or making decisions? N N  Walking or climbing stairs? N N  Dressing or bathing? N N  Doing errands, shopping? N N  Preparing Food and eating ? N -  Using the Toilet? N -  In the past six months, have you accidently leaked urine? N -  Do you have problems with loss of bowel control? N -  Managing your Medications? N -  Managing your Finances? N -  Housekeeping or managing your Housekeeping? N -  Some recent data might be hidden    Patient Care Team: Midge Minium, MD as PCP - General (Family Medicine) Milus Banister, MD as Attending Physician  (Gastroenterology) Harold Hedge, Darrick Grinder, MD as Consulting Physician (Allergy and Immunology) Nunzio Cobbs, MD as Consulting Physician (Obstetrics and Gynecology) Consuella Lose, MD as Consulting Physician (Neurosurgery) Otelia Sergeant, OD as Consulting Physician Jamesetta Geralds, [place] (Chiropractic Medicine) [facility], Skin Surgery  Indicate any recent Medical Services you may have received from other than [facility] providers in the past year (date may be approximate).     Assessment:   This is a routine wellness examination for [facility].  Hearing/Vision screen  Hearing Screening   125Hz  250Hz  500Hz  1000Hz  2000Hz  3000Hz  4000Hz  6000Hz  8000Hz   Right ear:           Left ear:           Comments: No issues  Vision Screening Comments: Wears glasses Asteye exam-76yrs ago-Dr. Oswaldo Conroy  Dietary issues and exercise activities discussed: Current Exercise Habits: Home exercise routine, Type of exercise: walking, Time (Minutes): 30, Frequency (Times/Week): 3, Weekly Exercise (Minutes/Week): 90, Exercise limited by: None identified  Goals    . Patient Stated     Maintain current health & drink more water      Depression Screen PHQ 2/9 Scores 11/27/2020 07/18/2020 05/25/2020 11/22/2019 08/10/2019 05/28/2019 02/18/2019  PHQ - 2 Score 0 0 0 0 0 0 0  PHQ- 9 Score - - 0 0 0 0 -    Fall Risk Fall Risk  11/27/2020 07/18/2020 05/25/2020 11/22/2019 11/22/2019  Falls in the past year? 0 0 0 0 0  Number falls in past yr: 0 0 0 0 0  Injury with Fall? 0 0 0 0 0  Follow up Falls prevention discussed Falls evaluation completed Falls evaluation completed Falls evaluation completed Falls evaluation completed    FALL RISK PREVENTION PERTAINING TO THE HOME:  Any stairs in or around the home? No  Home free of loose throw rugs in walkways, pet beds, electrical cords, etc? Yes  Adequate lighting in your home to reduce risk of falls? Yes  ASSISTIVE DEVICES UTILIZED TO PREVENT FALLS:  Life alert? No  Use of a cane,  walker or w/c? No  Grab bars in the bathroom? Yes  Shower chair or bench in shower? No  Elevated toilet seat or a handicapped toilet? No   TIMED UP AND GO:  Was the test performed? Yes .  Length of time to ambulate 10 feet: 9 sec.   Gait steady and fast without use of assistive device  Cognitive Function:Normal cognitive status assessed by direct observation by this Nurse Health Advisor. No abnormalities found.   MMSE - Mini Mental State Exam 11/19/2018 11/12/2017  Orientation to time 5 5  Orientation to Place 5 5  Registration 3 3  Attention/ Calculation 5 5  Recall 3 3  Language- name 2 objects 2 2  Language- repeat 1 1  Language- follow 3 step command 3 3  Language- read & follow direction 1 1  Write a sentence 1 1  Copy design 1 1  Total score 30 30     6CIT Screen 11/27/2020  What Year? 0 points  What month? 0 points  What time? 0 points  Count back from 20 0 points  Months in reverse 0 points  Repeat phrase 0 points  Total Score 0    Immunizations Immunization History  Administered Date(s) Administered  . Influenza, High Dose Seasonal PF 09/02/2016, 09/12/2020  . Influenza,inj,Quad PF,6+ Mos 09/05/2015, 09/15/2018, 10/02/2018, 08/10/2019  . Influenza-Unspecified 08/15/2017  . PFIZER SARS-COV-2 Vaccination 12/27/2019, 01/17/2020, 09/12/2020  . Pneumococcal Conjugate-13 05/22/2015  . Pneumococcal Polysaccharide-23 11/04/2016  . Zoster Recombinat (Shingrix) 03/24/2017, 07/15/2017    TDAP status: Due, Education has been provided regarding the importance of this vaccine. Advised may receive this vaccine at local pharmacy or Health Dept. Aware to provide a copy of the vaccination record if obtained from local pharmacy or Health Dept. Verbalized acceptance and understanding.  Flu Vaccine status: Up to date  Pneumococcal vaccine status: Up to date  Covid-19 vaccine status: Completed vaccines  Qualifies for Shingles Vaccine? No   Zostavax completed No    Shingrix Completed?: Yes  Screening Tests Health Maintenance  Topic Date Due  . TETANUS/TDAP  Never done  . MAMMOGRAM  02/23/2022  . Fecal DNA (Cologuard)  12/30/2022  . INFLUENZA VACCINE  Completed  . DEXA SCAN  Completed  . COVID-19 Vaccine  Completed  . Hepatitis C Screening  Completed  . PNA vac Low Risk Adult  Completed    Health Maintenance  Health Maintenance Due  Topic Date Due  . TETANUS/TDAP  Never done    Colorectal cancer screening: Type of screening: Cologuard. Completed 12/31/2019. Repeat every 3 years  Mammogram status: Completed Bilateral-02/24/2020. Repeat every year  Bone Density status: Due- Declined.  Lung Cancer Screening: (Low Dose CT Chest recommended if Age 9-80 years, 30 pack-year currently smoking OR have quit w/in 15years.) does not qualify.    Additional Screening:  Hepatitis C Screening:Completed 05/06/2017  Vision Screening: Recommended annual ophthalmology exams for early detection of glaucoma and other disorders of the eye. Is the patient up to date with their annual eye exam?  No  Who is the provider or what is the name of the office in which the patient attends annual eye exams? Dr. Oswaldo Conroy Patient aware that she needs to schedule an appt.  Dental Screening: Recommended annual dental exams for proper oral hygiene  Community Resource Referral / Chronic Care Management: CRR required this visit?  No   CCM required this visit?  No      Plan:     I have personally reviewed and noted the following in the patient's chart:   . Medical and social history . Use of alcohol, tobacco or illicit drugs  . Current medications and supplements . Functional ability and status . Nutritional status . Physical activity . Advanced directives . List of other physicians . Hospitalizations, surgeries, and ER visits in previous 12 months . Vitals . Screenings to include cognitive, depression, and falls . Referrals and appointments  In addition,  I have reviewed and discussed with patient certain preventive protocols, quality metrics, and best practice recommendations. A written personalized care plan for preventive services as well as general preventive health recommendations were provided to patient.     Marta Antu, LPN   25/27/1292  Nurse Health Advisor  Nurse Notes: None

## 2020-11-27 ENCOUNTER — Ambulatory Visit (INDEPENDENT_AMBULATORY_CARE_PROVIDER_SITE_OTHER): Payer: Medicare Other

## 2020-11-27 ENCOUNTER — Other Ambulatory Visit: Payer: Self-pay

## 2020-11-27 VITALS — BP 138/70 | HR 76 | Temp 97.7°F | Resp 16 | Ht 64.0 in | Wt 147.6 lb

## 2020-11-27 DIAGNOSIS — Z0000 Encounter for general adult medical examination without abnormal findings: Secondary | ICD-10-CM | POA: Diagnosis not present

## 2020-11-27 NOTE — Patient Instructions (Signed)
Sarah [name] , Thank you for taking time to come for your Medicare Wellness Visit. I appreciate your ongoing commitment to your health goals. Please review the following plan we discussed and let me know if I can assist you in the future.   Screening recommendations/referrals: Colonoscopy: Completed Cologuard 12/31/2019- Not required after age 74. Mammogram: Completed 02/24/2020-Due 02/23/2021 Bone Density: Declined Recommended yearly ophthalmology/optometry visit for glaucoma screening and checkup Recommended yearly dental visit for hygiene and checkup  Vaccinations: Influenza vaccine: Up to date Pneumococcal vaccine: Completed vaccines Tdap vaccine: Discuss with pharmacy Shingles vaccine: Completed vaccines  Covid-19:Completed vaccines  Advanced directives: Please bring a copy for your chart.  Conditions/risks identified: See problem list  Next appointment: Follow up in one year for your annual wellness visit 11/27/2021 @ 8:15   Preventive Care 65 Years and Older, Female Preventive care refers to lifestyle choices and visits with your health care provider that can promote health and wellness. What does preventive care include?  A yearly physical exam. This is also called an annual well check.  Dental exams once or twice a year.  Routine eye exams. Ask your health care provider how often you should have your eyes checked.  Personal lifestyle choices, including:  Daily care of your teeth and gums.  Regular physical activity.  Eating a healthy diet.  Avoiding tobacco and drug use.  Limiting alcohol use.  Practicing safe sex.  Taking low-dose aspirin every day.  Taking vitamin and mineral supplements as recommended by your health care provider. What happens during an annual well check? The services and screenings done by your health care provider during your annual well check will depend on your age, overall health, lifestyle risk factors, and family history of  disease. Counseling  Your health care provider may ask you questions about your:  Alcohol use.  Tobacco use.  Drug use.  Emotional well-being.  Home and relationship well-being.  Sexual activity.  Eating habits.  History of falls.  Memory and ability to understand (cognition).  Work and work Statistician.  Reproductive health. Screening  You may have the following tests or measurements:  Height, weight, and BMI.  Blood pressure.  Lipid and cholesterol levels. These may be checked every 5 years, or more frequently if you are over 12 years old.  Skin check.  Lung cancer screening. You may have this screening every year starting at age 79 if you have a 30-pack-year history of smoking and currently smoke or have quit within the past 15 years.  Fecal occult blood test (FOBT) of the stool. You may have this test every year starting at age 75.  Flexible sigmoidoscopy or colonoscopy. You may have a sigmoidoscopy every 5 years or a colonoscopy every 10 years starting at age 63.  Hepatitis C blood test.  Hepatitis B blood test.  Sexually transmitted disease (STD) testing.  Diabetes screening. This is done by checking your blood sugar (glucose) after you have not eaten for a while (fasting). You may have this done every 1-3 years.  Bone density scan. This is done to screen for osteoporosis. You may have this done starting at age 59.  Mammogram. This may be done every 1-2 years. Talk to your health care provider about how often you should have regular mammograms. Talk with your health care provider about your test results, treatment options, and if necessary, the need for more tests. Vaccines  Your health care provider may recommend certain vaccines, such as:  Influenza vaccine. This is recommended every  year.  Tetanus, diphtheria, and acellular pertussis (Tdap, Td) vaccine. You may need a Td booster every 10 years.  Zoster vaccine. You may need this after age  42.  Pneumococcal 13-valent conjugate (PCV13) vaccine. One dose is recommended after age 54.  Pneumococcal polysaccharide (PPSV23) vaccine. One dose is recommended after age 89. Talk to your health care provider about which screenings and vaccines you need and how often you need them. This information is not intended to replace advice given to you by your health care provider. Make sure you discuss any questions you have with your health care provider. Document Released: 12/29/2015 Document Revised: 08/21/2016 Document Reviewed: 10/03/2015 Elsevier Interactive Patient Education  2017 [employer] Prevention in the Home Falls can cause injuries. They can happen to people of all ages. There are many things you can do to make your home safe and to help prevent falls. What can I do on the outside of my home?  Regularly fix the edges of walkways and driveways and fix any cracks.  Remove anything that might make you trip as you walk through a door, such as a raised step or threshold.  Trim any bushes or trees on the path to your home.  Use bright outdoor lighting.  Clear any walking paths of anything that might make someone trip, such as rocks or tools.  Regularly check to see if handrails are loose or broken. Make sure that both sides of any steps have handrails.  Any raised decks and porches should have guardrails on the edges.  Have any leaves, snow, or ice cleared regularly.  Use sand or salt on walking paths during winter.  Clean up any spills in your garage right away. This includes oil or grease spills. What can I do in the bathroom?  Use night lights.  Install grab bars by the toilet and in the tub and shower. Do not use towel bars as grab bars.  Use non-skid mats or decals in the tub or shower.  If you need to sit down in the shower, use a plastic, non-slip stool.  Keep the floor dry. Clean up any water that spills on the floor as soon as it happens.  Remove  soap buildup in the tub or shower regularly.  Attach bath mats securely with double-sided non-slip rug tape.  Do not have throw rugs and other things on the floor that can make you trip. What can I do in the bedroom?  Use night lights.  Make sure that you have a light by your bed that is easy to reach.  Do not use any sheets or blankets that are too big for your bed. They should not hang down onto the floor.  Have a firm chair that has side arms. You can use this for support while you get dressed.  Do not have throw rugs and other things on the floor that can make you trip. What can I do in the kitchen?  Clean up any spills right away.  Avoid walking on wet floors.  Keep items that you use a lot in easy-to-reach places.  If you need to reach something above you, use a strong step stool that has a grab bar.  Keep electrical cords out of the way.  Do not use floor polish or wax that makes floors slippery. If you must use wax, use non-skid floor wax.  Do not have throw rugs and other things on the floor that can make you trip. What can  I do with my stairs?  Do not leave any items on the stairs.  Make sure that there are handrails on both sides of the stairs and use them. Fix handrails that are broken or loose. Make sure that handrails are as long as the stairways.  Check any carpeting to make sure that it is firmly attached to the stairs. Fix any carpet that is loose or worn.  Avoid having throw rugs at the top or bottom of the stairs. If you do have throw rugs, attach them to the floor with carpet tape.  Make sure that you have a light switch at the top of the stairs and the bottom of the stairs. If you do not have them, ask someone to add them for you. What else can I do to help prevent falls?  Wear shoes that:  Do not have high heels.  Have rubber bottoms.  Are comfortable and fit you well.  Are closed at the toe. Do not wear sandals.  If you use a  stepladder:  Make sure that it is fully opened. Do not climb a closed stepladder.  Make sure that both sides of the stepladder are locked into place.  Ask someone to hold it for you, if possible.  Clearly mark and make sure that you can see:  Any grab bars or handrails.  First and last steps.  Where the edge of each step is.  Use tools that help you move around (mobility aids) if they are needed. These include:  Canes.  Walkers.  Scooters.  Crutches.  Turn on the lights when you go into a dark area. Replace any light bulbs as soon as they burn out.  Set up your furniture so you have a clear path. Avoid moving your furniture around.  If any of your floors are uneven, fix them.  If there are any pets around you, be aware of where they are.  Review your medicines with your doctor. Some medicines can make you feel dizzy. This can increase your chance of falling. Ask your doctor what other things that you can do to help prevent falls. This information is not intended to replace advice given to you by your health care provider. Make sure you discuss any questions you have with your health care provider. Document Released: 09/28/2009 Document Revised: 05/09/2016 Document Reviewed: 01/06/2015 Elsevier Interactive Patient Education  2017 [employer].

## 2020-12-20 ENCOUNTER — Other Ambulatory Visit: Payer: Self-pay | Admitting: Family Medicine

## 2020-12-20 DIAGNOSIS — I10 Essential (primary) hypertension: Secondary | ICD-10-CM

## 2021-01-04 ENCOUNTER — Other Ambulatory Visit: Payer: Self-pay | Admitting: Family Medicine

## 2021-01-08 ENCOUNTER — Other Ambulatory Visit: Payer: Self-pay | Admitting: Family Medicine

## 2021-01-22 ENCOUNTER — Other Ambulatory Visit: Payer: Self-pay | Admitting: Obstetrics and Gynecology

## 2021-01-22 DIAGNOSIS — Z1231 Encounter for screening mammogram for malignant neoplasm of breast: Secondary | ICD-10-CM

## 2021-02-19 ENCOUNTER — Encounter: Payer: Self-pay | Admitting: Family Medicine

## 2021-02-20 ENCOUNTER — Encounter: Payer: Self-pay | Admitting: Family Medicine

## 2021-02-20 ENCOUNTER — Other Ambulatory Visit: Payer: Self-pay

## 2021-02-20 ENCOUNTER — Ambulatory Visit (INDEPENDENT_AMBULATORY_CARE_PROVIDER_SITE_OTHER): Payer: Medicare Other | Admitting: Family Medicine

## 2021-02-20 ENCOUNTER — Ambulatory Visit: Payer: Self-pay | Admitting: Family Medicine

## 2021-02-20 VITALS — BP 120/78 | HR 88 | Temp 97.2°F | Resp 19 | Ht 64.0 in | Wt 148.6 lb

## 2021-02-20 DIAGNOSIS — Z91030 Bee allergy status: Secondary | ICD-10-CM | POA: Insufficient documentation

## 2021-02-20 DIAGNOSIS — J301 Allergic rhinitis due to pollen: Secondary | ICD-10-CM | POA: Insufficient documentation

## 2021-02-20 DIAGNOSIS — R21 Rash and other nonspecific skin eruption: Secondary | ICD-10-CM | POA: Insufficient documentation

## 2021-02-20 DIAGNOSIS — J309 Allergic rhinitis, unspecified: Secondary | ICD-10-CM | POA: Insufficient documentation

## 2021-02-20 DIAGNOSIS — M79662 Pain in left lower leg: Secondary | ICD-10-CM

## 2021-02-20 DIAGNOSIS — J3081 Allergic rhinitis due to animal (cat) (dog) hair and dander: Secondary | ICD-10-CM | POA: Insufficient documentation

## 2021-02-20 DIAGNOSIS — Z91013 Allergy to seafood: Secondary | ICD-10-CM | POA: Insufficient documentation

## 2021-02-20 DIAGNOSIS — K219 Gastro-esophageal reflux disease without esophagitis: Secondary | ICD-10-CM | POA: Insufficient documentation

## 2021-02-20 DIAGNOSIS — T63441D Toxic effect of venom of bees, accidental (unintentional), subsequent encounter: Secondary | ICD-10-CM | POA: Insufficient documentation

## 2021-02-20 NOTE — Progress Notes (Signed)
   Subjective:    Patient ID: Sarah [name], female    DOB: 1946/09/30, 75 y.o.   MRN: [id number]  HPI Calf pain- Left leg.  Pt reports 'it has been hurting off and on for a couple of weeks'.  Pt reports this weekend it felt like she had 'a knee high sock on'.  Felt tight, somewhat swollen.  No obvious visible swelling.  No redness or warmth.  No change in activity level.  No known injury.  No change in footwear.  Denies CP, SOB, palpitations.   Review of Systems For ROS see HPI   This visit occurred during the SARS-CoV-2 public health emergency.  Safety protocols were in place, including screening questions prior to the visit, additional usage of staff PPE, and extensive cleaning of exam room while observing appropriate contact time as indicated for disinfecting solutions.       Objective:   Physical Exam Vitals reviewed.  Constitutional:      General: She is not in acute distress.    Appearance: Normal appearance. She is not ill-appearing.  HENT:     Head: Normocephalic and atraumatic.  Cardiovascular:     Pulses: Normal pulses.  Pulmonary:     Effort: Pulmonary effort is normal.  Musculoskeletal:        General: Tenderness (TTP over L posterior calf w/o palpable cord) present. No swelling.  Skin:    General: Skin is warm and dry.     Findings: No erythema.  Neurological:     General: No focal deficit present.     Mental Status: She is alert and oriented to person, place, and time.  Psychiatric:        Mood and Affect: Mood normal.        Behavior: Behavior normal.        Thought Content: Thought content normal.           Assessment & Plan:  L calf pain- new.  Pt has no hx of clotting disorder or DVT.  Pt denies injury or period of immobility.  Could be muscle strain but given TTP and feeling of tightness will get [place] to assess.  Pt to take ibuprofen or ASA for pain relief.  Heat.  Reviewed red flags that should prompt immediate medical attention.

## 2021-02-20 NOTE — Patient Instructions (Addendum)
Stop and see Sarah [name] up front to get your ultrasound information (she's calling now) Once we have the ultrasound results we'll determine the next steps HEAT for pain relief Aspirin or ibuprofen as needed for pain Call with any questions or concerns Hang in there!!!

## 2021-02-23 ENCOUNTER — Other Ambulatory Visit: Payer: Self-pay

## 2021-02-23 ENCOUNTER — Ambulatory Visit (HOSPITAL_COMMUNITY)
Admission: RE | Admit: 2021-02-23 | Discharge: 2021-02-23 | Disposition: A | Discharge status: Home / Self Care | Payer: Medicare Other | Source: Ambulatory Visit | Attending: Family Medicine | Admitting: Family Medicine

## 2021-02-23 DIAGNOSIS — M79662 Pain in left lower leg: Secondary | ICD-10-CM | POA: Insufficient documentation

## 2021-03-06 ENCOUNTER — Ambulatory Visit: Payer: Self-pay

## 2021-03-06 ENCOUNTER — Ambulatory Visit (INDEPENDENT_AMBULATORY_CARE_PROVIDER_SITE_OTHER): Payer: Medicare Other | Admitting: Orthopaedic Surgery

## 2021-03-06 ENCOUNTER — Other Ambulatory Visit: Payer: Self-pay

## 2021-03-06 ENCOUNTER — Encounter: Payer: Self-pay | Admitting: Orthopaedic Surgery

## 2021-03-06 VITALS — Ht 64.0 in | Wt 148.0 lb

## 2021-03-06 DIAGNOSIS — M25562 Pain in left knee: Secondary | ICD-10-CM | POA: Diagnosis not present

## 2021-03-06 NOTE — Progress Notes (Signed)
Office Visit Note   Patient: Sarah [name]           Date of Birth: [birth year]           MRN: [id number] Visit Date: 03/06/2021              Requested by: Sarah Minium, MD [address] [place],  [place] [address] PCP: Sarah Minium, MD   Assessment & Plan: Visit Diagnoses:  1. Acute pain of left knee     Plan: Mrs. Sarah [name] is accompanied by her husband here for evaluation of left knee pain and specifically posterolaterally.  She has had trouble now for about 6 weeks does not remember specific injury or trauma.  She has been seen by her primary care physician and a Doppler study was performed at [facility] without evidence of DVT.  She has had some mild calf pain but mostly in the popliteal area.  She does have history of arthritis of her knee.  Will order MRI scan with contrast  Follow-Up Instructions: No follow-ups on file.   Orders:  Orders Placed This Encounter  Procedures  . XR KNEE 3 VIEW LEFT   No orders of the defined types were placed in this encounter.     Procedures: No procedures performed   Clinical Data: No additional findings.   Subjective: Chief Complaint  Patient presents with  . Left Leg - Follow-up  Patient presents today for left calf pain. She said that she started to experience calf pain about 6 weeks ago. No known injury. She has pain more so with sitting, but also with walking at times. She said that it is worsening. She had an ultrasound to rule out a DVT on 02/23/2021 and it was negative. She does not take anything for pain.  No related numbness or tingling  HPI  Review of Systems   Objective: Vital Signs: Ht 5\' 4"  (1.626 m)   Wt 148 lb (67.1 kg)   LMP 12/16/1993 (Approximate)   BMI 25.40 kg/m   Physical Exam Constitutional:      Appearance: She is well-developed.  Eyes:     Pupils: Pupils are equal, round, and reactive to light.  Pulmonary:     Effort: Pulmonary effort is normal.  Skin:    General:  Skin is warm and dry.  Neurological:     Mental Status: She is alert and oriented to person, place, and time.  Psychiatric:        Behavior: Behavior normal.     Ortho Exam awake alert and oriented x3.  Comfortable sitting.  No acute distress.  Full extension and flexion of her left knee probably 110 degrees.  No instability.  Some mild patella crepitation but no specific patella pain.  Very minimal medial lateral joint pain.  There was some fullness in the popliteal space laterally compared to her right knee and she was tender.  Very minimal proximal calf discomfort.  No distal edema.  Neurologically intact.  Good pulses.  No Tinel's over the mass Specialty Comments:  No specialty comments available.  Imaging: No results found.   PMFS History: Patient Active Problem List   Diagnosis Date Noted  . Pain in left knee 03/06/2021  . Allergic rhinitis 02/20/2021  . Allergic rhinitis due to animal (cat) (dog) hair and dander 02/20/2021  . Allergic rhinitis due to pollen 02/20/2021  . Bee allergy status 02/20/2021  . Gastro-esophageal reflux disease without esophagitis 02/20/2021  . Rash  and other nonspecific skin eruption 02/20/2021  . Seafood allergy 02/20/2021  . Toxic effect of venom of bees, accidental (unintentional), subsequent encounter 02/20/2021  . Pain in left leg 10/11/2020  . Overweight (BMI 25.0-29.9) 05/25/2020  . Diverticulitis 07/09/2017  . Primary osteoarthritis of both hands 12/23/2016  . Primary osteoarthritis of both knees 12/23/2016  . Postural kyphosis of thoracic region 12/23/2016  . DJD (degenerative joint disease), cervical 12/23/2016  . History of gastroesophageal reflux (GERD) 12/23/2016  . History of fracture of left hip 12/23/2016  . Physical exam 11/04/2016  . Environmental and seasonal allergies 06/27/2016  . Esophageal dysphagia 06/03/2016  . Osteoporosis 08/03/2014  . Hyperlipidemia 11/06/2013  . Hypertension 11/06/2013   Past Medical History:   Diagnosis Date  . Allergy   . Arthritis   . Asthma   . Cancer ([place])    basal cell cancer--nose  . Cataract    left eye and stable   . Cervical disc disease    bulging  . Diverticulitis   . Diverticulosis   . GERD (gastroesophageal reflux disease)   . Hyperlipidemia   . Hypertension   . Osteopenia   . Osteoporosis   . PONV (postoperative nausea and vomiting)   . Status post dilation of esophageal narrowing     Family History  Problem Relation Age of Onset  . Hypertension Mother   . Diabetes Father   . Hypertension Father   . Heart disease Father   . Diabetes Sister   . Hypertension Sister   . Schizophrenia Sister   . Thyroid disease Sister   . Heart disease Sister   . Throat cancer Paternal Uncle        smoker  . Lung cancer Cousin        smoker  . Colon cancer Neg Hx   . Pancreatic cancer Neg Hx   . Stomach cancer Neg Hx   . Liver disease Neg Hx   . Colon polyps Neg Hx   . Esophageal cancer Neg Hx   . Rectal cancer Neg Hx     Past Surgical History:  Procedure Laterality Date  . ABDOMINAL HYSTERECTOMY  1995   TVH--still has ovaries  . ABDOMINAL SACROCOLPOPEXY  2007   Halban culdoplasty & Burch procedure--Dr. Quincy Simmonds  . APPENDECTOMY     Dr. Hassell Done  . arthroscopic shoulder Left    Dr. Durward Fortes, bone spur  . BLADDER SUSPENSION    . CERVICAL FUSION     C-4, C-5 fusion  . CHOLECYSTECTOMY     Dr. Georgina Quint  . ESOPHAGEAL DILATION    . HIP ARTHROPLASTY Left 11/07/2013   Procedure: ARTHROPLASTY BIPOLAR HIP;  Surgeon: Garald Balding, MD;  Location: [facility];  Service: Orthopedics;  Laterality: Left;  . KNEE ARTHROSCOPY Bilateral    Dr. Durward Fortes  . OTHER SURGICAL HISTORY  2011   posterior Colporrhaphy with Xenform biological graft--Dr. Quincy Simmonds  . RECTOCELE REPAIR  2011   posterior colporrhaphy with Xenform biological graft--Dr. Quincy Simmonds  . UPPER GASTROINTESTINAL ENDOSCOPY     Social History   Occupational History  . Occupation: retired  Tobacco Use  .  Smoking status: Never Smoker  . Smokeless tobacco: Never Used  Vaping Use  . Vaping Use: Never used  Substance and Sexual Activity  . Alcohol use: Not Currently  . Drug use: No  . Sexual activity: Yes    Partners: Male    Birth control/protection: Other-see comments, Post-menopausal, Surgical    Comment: TVH--Still has ovaries

## 2021-03-09 ENCOUNTER — Other Ambulatory Visit: Payer: Self-pay

## 2021-03-09 ENCOUNTER — Ambulatory Visit
Admission: RE | Admit: 2021-03-09 | Discharge: 2021-03-09 | Disposition: A | Discharge status: Home / Self Care | Payer: Medicare Other | Source: Ambulatory Visit | Attending: Obstetrics and Gynecology | Admitting: Obstetrics and Gynecology

## 2021-03-09 DIAGNOSIS — Z1231 Encounter for screening mammogram for malignant neoplasm of breast: Secondary | ICD-10-CM

## 2021-03-22 ENCOUNTER — Ambulatory Visit (HOSPITAL_COMMUNITY)
Admission: RE | Admit: 2021-03-22 | Discharge: 2021-03-22 | Disposition: A | Discharge status: Home / Self Care | Payer: Medicare Other | Source: Ambulatory Visit | Attending: Orthopaedic Surgery | Admitting: Orthopaedic Surgery

## 2021-03-22 DIAGNOSIS — M25562 Pain in left knee: Secondary | ICD-10-CM | POA: Diagnosis present

## 2021-03-22 MED ORDER — GADOBUTROL 1 MMOL/ML IV SOLN
7.0000 mL | Freq: Once | INTRAVENOUS | Status: AC | PRN
  Administered 2021-03-22: 7 mL via INTRAVENOUS

## 2021-03-28 ENCOUNTER — Other Ambulatory Visit: Payer: Self-pay

## 2021-03-28 ENCOUNTER — Encounter: Payer: Self-pay | Admitting: Orthopaedic Surgery

## 2021-03-28 ENCOUNTER — Ambulatory Visit (INDEPENDENT_AMBULATORY_CARE_PROVIDER_SITE_OTHER): Payer: Medicare Other | Admitting: Orthopaedic Surgery

## 2021-03-28 VITALS — Ht 64.0 in | Wt 148.0 lb

## 2021-03-28 DIAGNOSIS — M79605 Pain in left leg: Secondary | ICD-10-CM

## 2021-03-28 DIAGNOSIS — M170 Bilateral primary osteoarthritis of knee: Secondary | ICD-10-CM | POA: Diagnosis not present

## 2021-03-28 NOTE — Progress Notes (Signed)
Office Visit Note   Patient: Sarah [name]           Date of Birth: 1946/09/20           MRN: [id number] Visit Date: 03/28/2021              Requested by: Midge Minium, MD [address] [place],  [place] [address] PCP: Midge Minium, MD   Assessment & Plan: Visit Diagnoses:  1. Primary osteoarthritis of both knees   2. Pain in left leg     Plan: Sarah [name] is accompanied by her husband and here for follow-up evaluation of the pain in the posterior aspect of her left knee.  She remembers having an injury about a month ago while walking in the field and felt something "happened" behind her left knee.  She is had a Doppler study that was negative for DVT but continues to have quite a bit of discomfort.  I ordered an MRI scan that revealed some mild to moderate tricompartmental arthritis but no evidence of a Baker's cyst.  There was a partial thickness tear of the origin of the medial and lateral gastrocnemius tendons.  Study was performed with and without contrast.  I suspect that is probably the causing her pain.  She does have some localized areas of tenderness along the proximal calf muscles but no distal edema neurologically intact.  There is no knee effusion.  Long discussion regarding the results.  Certainly we treat this with symptomatic relief using heat or ice and may be even Voltaren gel.  I do not think that arthritis is particularly active at this point.  Hopefully this will resolve on its own over period of time.  She certainly would not have any limitations of activities based on the results but only based on symptoms  Follow-Up Instructions: Return if symptoms worsen or fail to improve.   Orders:  No orders of the defined types were placed in this encounter.  No orders of the defined types were placed in this encounter.     Procedures: No procedures performed   Clinical Data: No additional findings.   Subjective: Chief Complaint  Patient presents  with  . Left Knee - Follow-up    MRI review  Patient presents today for follow up on her left knee. She had an MRI of her left knee and is here today for those results.  HPI  Review of Systems   Objective: Vital Signs: Ht 5\' 4"  (1.626 m)   Wt 148 lb (67.1 kg)   LMP 12/16/1993 (Approximate)   BMI 25.40 kg/m   Physical Exam Constitutional:      Appearance: She is well-developed.  Eyes:     Pupils: Pupils are equal, round, and reactive to light.  Pulmonary:     Effort: Pulmonary effort is normal.  Skin:    General: Skin is warm and dry.  Neurological:     Mental Status: She is alert and oriented to person, place, and time.  Psychiatric:        Behavior: Behavior normal.     Ortho Exam left knee with minimal patellar crepitation but no pain with compression.  No medial or lateral joint pain.  No instability.  Full extension of flexed well over 105 degrees.  Had no popliteal cyst.  Some areas of tenderness over the proximal gastrocnemius muscle but no skin changes.  Neurologically intact Specialty Comments:  No specialty comments available.  Imaging: No results found.  PMFS History: Patient Active Problem List   Diagnosis Date Noted  . Pain in left knee 03/06/2021  . Allergic rhinitis 02/20/2021  . Allergic rhinitis due to animal (cat) (dog) hair and dander 02/20/2021  . Allergic rhinitis due to pollen 02/20/2021  . Bee allergy status 02/20/2021  . Gastro-esophageal reflux disease without esophagitis 02/20/2021  . Rash and other nonspecific skin eruption 02/20/2021  . Seafood allergy 02/20/2021  . Toxic effect of venom of bees, accidental (unintentional), subsequent encounter 02/20/2021  . Pain in left leg 10/11/2020  . Overweight (BMI 25.0-29.9) 05/25/2020  . Diverticulitis 07/09/2017  . Primary osteoarthritis of both hands 12/23/2016  . Primary osteoarthritis of both knees 12/23/2016  . Postural kyphosis of thoracic region 12/23/2016  . DJD (degenerative joint  disease), cervical 12/23/2016  . History of gastroesophageal reflux (GERD) 12/23/2016  . History of fracture of left hip 12/23/2016  . Physical exam 11/04/2016  . Environmental and seasonal allergies 06/27/2016  . Esophageal dysphagia 06/03/2016  . Osteoporosis 08/03/2014  . Hyperlipidemia 11/06/2013  . Hypertension 11/06/2013   Past Medical History:  Diagnosis Date  . Allergy   . Arthritis   . Asthma   . Cancer ([place])    basal cell cancer--nose  . Cataract    left eye and stable   . Cervical disc disease    bulging  . Diverticulitis   . Diverticulosis   . GERD (gastroesophageal reflux disease)   . Hyperlipidemia   . Hypertension   . Osteopenia   . Osteoporosis   . PONV (postoperative nausea and vomiting)   . Status post dilation of esophageal narrowing     Family History  Problem Relation Age of Onset  . Hypertension Mother   . Diabetes Father   . Hypertension Father   . Heart disease Father   . Diabetes Sister   . Hypertension Sister   . Schizophrenia Sister   . Thyroid disease Sister   . Heart disease Sister   . Throat cancer Paternal Uncle        smoker  . Lung cancer Cousin        smoker  . Colon cancer Neg Hx   . Pancreatic cancer Neg Hx   . Stomach cancer Neg Hx   . Liver disease Neg Hx   . Colon polyps Neg Hx   . Esophageal cancer Neg Hx   . Rectal cancer Neg Hx     Past Surgical History:  Procedure Laterality Date  . ABDOMINAL HYSTERECTOMY  1995   TVH--still has ovaries  . ABDOMINAL SACROCOLPOPEXY  2007   Halban culdoplasty & Burch procedure--Dr. Quincy Simmonds  . APPENDECTOMY     Dr. Hassell Done  . arthroscopic shoulder Left    Dr. Durward Fortes, bone spur  . BLADDER SUSPENSION    . CERVICAL FUSION     C-4, C-5 fusion  . CHOLECYSTECTOMY     Dr. Georgina Quint  . ESOPHAGEAL DILATION    . HIP ARTHROPLASTY Left 11/07/2013   Procedure: ARTHROPLASTY BIPOLAR HIP;  Surgeon: Garald Balding, MD;  Location: [facility];  Service: Orthopedics;  Laterality: Left;  .  KNEE ARTHROSCOPY Bilateral    Dr. Durward Fortes  . OTHER SURGICAL HISTORY  2011   posterior Colporrhaphy with Xenform biological graft--Dr. Quincy Simmonds  . RECTOCELE REPAIR  2011   posterior colporrhaphy with Xenform biological graft--Dr. Quincy Simmonds  . UPPER GASTROINTESTINAL ENDOSCOPY     Social History   Occupational History  . Occupation: retired  Tobacco Use  . Smoking status: Never  Smoker  . Smokeless tobacco: Never Used  Vaping Use  . Vaping Use: Never used  Substance and Sexual Activity  . Alcohol use: Not Currently  . Drug use: No  . Sexual activity: Yes    Partners: Male    Birth control/protection: Other-see comments, Post-menopausal, Surgical    Comment: TVH--Still has ovaries

## 2021-05-01 NOTE — Progress Notes (Signed)
75 y.o. G26P1011 Married Caucasian female here for office visit.   Has occasional flares of Lichen sclerosis. She reports an area of irritation.  Using Clobetasol ointment for flares twice a day for 2 -3 days.   Good bladder control and bowel function.   She has been followed for osteoporosis and declines treatment. She is concerned about risk of infection with use of Evista and Prolia.   Some irritation with sex.  Husband has prostate cancer and still start seed treatment.   Enjoying camping.   PCP:  Annye Asa, MD   Patient's last menstrual period was 12/16/1993 (approximate).           Sexually active: Yes.    The current method of family planning is status post hysterectomy.    Exercising: Yes.    walking and yardwork Smoker:  no  Health Maintenance: Pap: 06-03-11 Neg History of abnormal Pap:  no MMG: 03-09-21 3D/Neg/BiRads1 Colonoscopy: 10-08-17 polyp/diverticulosis;next due 5 years.  Did Cologuard 2021, which was normal. BMD:09-10-15  Result :Osteoporosis Lt.hip -2.8--declines treatment.  Did not tolerate Evista.   TDaP:  PCP Gardasil:   no HIV: Never Hep C:05-06-17 Neg Screening Labs:  PCP.   reports that she has never smoked. She has never used smokeless tobacco. She reports current alcohol use of about 2.0 standard drinks of alcohol per week. She reports that she does not use drugs.  Past Medical History:  Diagnosis Date  . Allergy   . Arthritis   . Asthma   . Cancer ([place])    basal cell cancer--nose  . Cataract    left eye and stable   . Cervical disc disease    bulging  . Diverticulitis   . Diverticulosis   . GERD (gastroesophageal reflux disease)   . Hyperlipidemia   . Hypertension   . Osteopenia   . Osteoporosis   . PONV (postoperative nausea and vomiting)   . Status post dilation of esophageal narrowing     Past Surgical History:  Procedure Laterality Date  . ABDOMINAL HYSTERECTOMY  1995   TVH--still has ovaries  . ABDOMINAL  SACROCOLPOPEXY  2007   Halban culdoplasty & Burch procedure--Dr. Quincy Simmonds  . APPENDECTOMY     Dr. Hassell Done  . arthroscopic shoulder Left    Dr. Durward Fortes, bone spur  . BLADDER SUSPENSION    . CERVICAL FUSION     C-4, C-5 fusion  . CHOLECYSTECTOMY     Dr. Georgina Quint  . ESOPHAGEAL DILATION    . HIP ARTHROPLASTY Left 11/07/2013   Procedure: ARTHROPLASTY BIPOLAR HIP;  Surgeon: Garald Balding, MD;  Location: [facility];  Service: Orthopedics;  Laterality: Left;  . KNEE ARTHROSCOPY Bilateral    Dr. Durward Fortes  . OTHER SURGICAL HISTORY  2011   posterior Colporrhaphy with Xenform biological graft--Dr. Quincy Simmonds  . RECTOCELE REPAIR  2011   posterior colporrhaphy with Xenform biological graft--Dr. Quincy Simmonds  . UPPER GASTROINTESTINAL ENDOSCOPY      Current Outpatient Medications  Medication Sig Dispense Refill  . Ascorbic Acid (VITAMIN C PO) Take by mouth.    Marland Kitchen atorvastatin (LIPITOR) 10 MG tablet TAKE 1 TAKE BY MOUTH EVERY 3 DAYS 90 tablet 1  . beta carotene w/minerals (OCUVITE) tablet Take 1 tablet by mouth daily.    . Calcium Citrate (CITRACAL PO) Take 2 tablets by mouth daily.    . cetirizine (ZYRTEC) 10 MG tablet Take 1 tablet (10 mg total) by mouth daily. 30 tablet 11  . clobetasol ointment (TEMOVATE) 0.05 % Apply topically  2 (two) times daily as needed. Use for 2 weeks at time as needed. 30 g 0  . Coenzyme Q10 (COQ10) 200 MG CAPS Take 1 tablet by mouth daily.    Marland Kitchen EPIPEN 2-PAK 0.3 MG/0.3ML SOAJ injection Inject 1 mg into the muscle as needed. Reported on 06/28/2016    . Ergocalciferol (VITAMIN D2) 400 units TABS Take 1 capsule by mouth daily.    Marland Kitchen esomeprazole (NEXIUM) 40 MG capsule TAKE 1 CAPSULE(40 MG) BY MOUTH DAILY 90 capsule 1  . fish oil-omega-3 fatty acids 1000 MG capsule Take 2 g by mouth daily.    . fluticasone (FLONASE) 50 MCG/ACT nasal spray Place 2 sprays into both nostrils daily.    Marland Kitchen lactobacillus acidophilus (BACID) TABS tablet Take 2 tablets by mouth 2 (two) times a week.    .  Multiple Vitamin (MULTIVITAMIN) capsule Take 1 capsule by mouth daily.    Marland Kitchen PROAIR HFA 108 (90 Base) MCG/ACT inhaler INL 2 PFS PO Q 4 TO 6 H PRN  0  . valsartan (DIOVAN) 80 MG tablet TAKE 1 TABLET(80 MG) BY MOUTH DAILY 90 tablet 1   No current facility-administered medications for this visit.    Family History  Problem Relation Age of Onset  . Hypertension Mother   . Diabetes Father   . Hypertension Father   . Heart disease Father   . Diabetes Sister   . Hypertension Sister   . Schizophrenia Sister   . Thyroid disease Sister   . Heart disease Sister   . Throat cancer Paternal Uncle        smoker  . Lung cancer Cousin        smoker  . Colon cancer Neg Hx   . Pancreatic cancer Neg Hx   . Stomach cancer Neg Hx   . Liver disease Neg Hx   . Colon polyps Neg Hx   . Esophageal cancer Neg Hx   . Rectal cancer Neg Hx     Review of Systems  All other systems reviewed and are negative.   Exam:   BP 134/72   Pulse (!) 51   Ht 5\' 3"  (1.6 m)   Wt 145 lb (65.8 kg)   LMP 12/16/1993 (Approximate)   SpO2 98%   BMI 25.69 kg/m     General appearance: alert, cooperative and appears stated age Head:  neck brace on.  Lungs: clear to auscultation bilaterally Breasts: normal appearance, no masses or tenderness, No nipple retraction or dimpling, No nipple discharge or bleeding, No axillary adenopathy Heart: regular rate and rhythm Abdomen: soft, non-tender; no masses, no organomegaly Extremities: extremities normal, atraumatic, no cyanosis or edema Skin: skin color, texture, turgor normal. No rashes or lesions Lymph nodes: supraclavicular, axillary, and inguinal nodes normal. Neurologic: grossly normal  Pelvic: External genitalia:  Thickened tissue of right perineum with ulceration present.              No abnormal inguinal nodes palpated.              Urethra:  normal appearing urethra with no masses, tenderness or lesions              Bartholins and Skenes: normal                  Vagina: normal appearing vagina with normal color and discharge, no lesions              Cervix: absent  Pap taken: No. Bimanual Exam:  Uterus:  absent              Adnexa: no mass, fullness, tenderness              Rectal exam: Yes.  .  Confirms.              Anus:  normal sphincter tone, no lesions  Chaperone was present for exam.  Assessment:     Pelvic exam with abnormal finding present.  Lichen sclerosus. Uses Clobetasol. Vulvar lesion. Status post total vaginal hysterectomy.  Ovaries remain. Status post sacrocolpopexy, Halban culdoplasty and Burch.  Status post rectocele repair with biologic graft.  Screening breast exam. Rectal exam encounter. Osteoporosis. Has declined tx.  Intolerant of Evista.   Plan: Return for vulvar biopsy.  Rx for Clobetasol ointment.  Use twice a day for 2 weeks, then twice weekly at hs.  Pap not indicated.  Mammogram screening discussed. Self breast awareness reviewed. Bone density and treatment of osteoporosis declined.  We did talk about Prolia today.  Reducing risk of falls reviewed.  Guidelines for Calcium, Vitamin D, regular exercise program including cardiovascular and weight bearing exercise.   Follow up annually and prn.   29 min  total time was spent for this patient encounter, including preparation, face-to-face counseling with the patient, coordination of care, and documentation of the encounter.

## 2021-05-03 ENCOUNTER — Ambulatory Visit (INDEPENDENT_AMBULATORY_CARE_PROVIDER_SITE_OTHER): Payer: Medicare Other | Admitting: Obstetrics and Gynecology

## 2021-05-03 ENCOUNTER — Encounter: Payer: Self-pay | Admitting: Obstetrics and Gynecology

## 2021-05-03 ENCOUNTER — Other Ambulatory Visit: Payer: Self-pay

## 2021-05-03 VITALS — BP 134/72 | HR 51 | Ht 63.0 in | Wt 145.0 lb

## 2021-05-03 DIAGNOSIS — Z1239 Encounter for other screening for malignant neoplasm of breast: Secondary | ICD-10-CM

## 2021-05-03 DIAGNOSIS — N9089 Other specified noninflammatory disorders of vulva and perineum: Secondary | ICD-10-CM | POA: Diagnosis not present

## 2021-05-03 DIAGNOSIS — Z01411 Encounter for gynecological examination (general) (routine) with abnormal findings: Secondary | ICD-10-CM

## 2021-05-03 DIAGNOSIS — L900 Lichen sclerosus et atrophicus: Secondary | ICD-10-CM | POA: Diagnosis not present

## 2021-05-03 DIAGNOSIS — M810 Age-related osteoporosis without current pathological fracture: Secondary | ICD-10-CM | POA: Diagnosis not present

## 2021-05-03 DIAGNOSIS — Z008 Encounter for other general examination: Secondary | ICD-10-CM | POA: Diagnosis not present

## 2021-05-03 MED ORDER — CLOBETASOL PROPIONATE 0.05 % EX OINT
TOPICAL_OINTMENT | Freq: Two times a day (BID) | CUTANEOUS | 1 refills | Status: DC | PRN
Start: 2021-05-03 — End: 2022-06-28

## 2021-05-03 NOTE — Patient Instructions (Signed)

## 2021-05-03 NOTE — Addendum Note (Signed)
Addended by: Yisroel Ramming, Dietrich Pates E on: 05/03/2021 09:16 AM   Modules accepted: Orders

## 2021-05-10 ENCOUNTER — Encounter: Payer: Self-pay | Admitting: Obstetrics and Gynecology

## 2021-05-23 ENCOUNTER — Other Ambulatory Visit: Payer: Self-pay

## 2021-05-23 ENCOUNTER — Encounter: Payer: Self-pay | Admitting: Obstetrics and Gynecology

## 2021-05-23 ENCOUNTER — Other Ambulatory Visit (HOSPITAL_COMMUNITY)
Admission: RE | Admit: 2021-05-23 | Discharge: 2021-05-23 | Disposition: A | Discharge status: Home / Self Care | Payer: Medicare Other | Source: Ambulatory Visit | Attending: Obstetrics and Gynecology | Admitting: Obstetrics and Gynecology

## 2021-05-23 ENCOUNTER — Ambulatory Visit: Payer: Medicare Other | Admitting: Obstetrics and Gynecology

## 2021-05-23 ENCOUNTER — Ambulatory Visit (INDEPENDENT_AMBULATORY_CARE_PROVIDER_SITE_OTHER): Payer: Medicare Other | Admitting: Obstetrics and Gynecology

## 2021-05-23 DIAGNOSIS — N9089 Other specified noninflammatory disorders of vulva and perineum: Secondary | ICD-10-CM

## 2021-05-23 DIAGNOSIS — L900 Lichen sclerosus et atrophicus: Secondary | ICD-10-CM | POA: Diagnosis not present

## 2021-05-23 NOTE — Progress Notes (Signed)
GYNECOLOGY  VISIT   HPI: 75 y.o.   Married  Caucasian  female   G2P1010 with Patient's last menstrual period was 12/16/1993 (approximate).   here for   Vulvar biopsy.  She has a history of lichen sclerosus and uses clobetasol ointment.  On her recent exam, she had thickening of the right perineum with ulceration present.  She has been using the clobetasol ointment twice a day.  She still has an awareness of discomfort in her perineum.  GYNECOLOGIC HISTORY: Patient's last menstrual period was 12/16/1993 (approximate). Contraception:  hysterectomy Menopausal hormone therapy:  none Last mammogram:  03-09-21 category c density birads 1:neg Last pap smear:   06-03-11 neg        OB History    Gravida  2   Para  1   Term  1   Preterm  0   AB  1   Living        SAB  0   IAB  0   Ectopic  0   Multiple      Live Births                 Patient Active Problem List   Diagnosis Date Noted  . Pain in left knee 03/06/2021  . Allergic rhinitis 02/20/2021  . Allergic rhinitis due to animal (cat) (dog) hair and dander 02/20/2021  . Allergic rhinitis due to pollen 02/20/2021  . Bee allergy status 02/20/2021  . Gastro-esophageal reflux disease without esophagitis 02/20/2021  . Rash and other nonspecific skin eruption 02/20/2021  . Seafood allergy 02/20/2021  . Toxic effect of venom of bees, accidental (unintentional), subsequent encounter 02/20/2021  . Pain in left leg 10/11/2020  . Overweight (BMI 25.0-29.9) 05/25/2020  . Diverticulitis 07/09/2017  . Primary osteoarthritis of both hands 12/23/2016  . Primary osteoarthritis of both knees 12/23/2016  . Postural kyphosis of thoracic region 12/23/2016  . DJD (degenerative joint disease), cervical 12/23/2016  . History of gastroesophageal reflux (GERD) 12/23/2016  . History of fracture of left hip 12/23/2016  . Physical exam 11/04/2016  . Environmental and seasonal allergies 06/27/2016  . Esophageal dysphagia 06/03/2016  .  Osteoporosis 08/03/2014  . Hyperlipidemia 11/06/2013  . Hypertension 11/06/2013    Past Medical History:  Diagnosis Date  . Allergy   . Arthritis   . Asthma   . Cancer ([place])    basal cell cancer--nose  . Cataract    left eye and stable   . Cervical disc disease    bulging  . Diverticulitis   . Diverticulosis   . GERD (gastroesophageal reflux disease)   . Hyperlipidemia   . Hypertension   . Osteopenia   . Osteoporosis   . PONV (postoperative nausea and vomiting)   . Status post dilation of esophageal narrowing     Past Surgical History:  Procedure Laterality Date  . ABDOMINAL HYSTERECTOMY  1995   TVH--still has ovaries  . ABDOMINAL SACROCOLPOPEXY  2007   Halban culdoplasty & Burch procedure--Dr. Quincy Simmonds  . APPENDECTOMY     Dr. Hassell Done  . arthroscopic shoulder Left    Dr. Durward Fortes, bone spur  . BLADDER SUSPENSION    . CERVICAL FUSION     C-4, C-5 fusion  . CHOLECYSTECTOMY     Dr. Georgina Quint  . ESOPHAGEAL DILATION    . HIP ARTHROPLASTY Left 11/07/2013   Procedure: ARTHROPLASTY BIPOLAR HIP;  Surgeon: Garald Balding, MD;  Location: [facility];  Service: Orthopedics;  Laterality: Left;  . KNEE ARTHROSCOPY  Bilateral    Dr. Durward Fortes  . OTHER SURGICAL HISTORY  2011   posterior Colporrhaphy with Xenform biological graft--Dr. Quincy Simmonds  . RECTOCELE REPAIR  2011   posterior colporrhaphy with Xenform biological graft--Dr. Quincy Simmonds  . UPPER GASTROINTESTINAL ENDOSCOPY      Current Outpatient Medications  Medication Sig Dispense Refill  . Ascorbic Acid (VITAMIN C PO) Take by mouth.    Marland Kitchen atorvastatin (LIPITOR) 10 MG tablet TAKE 1 TAKE BY MOUTH EVERY 3 DAYS 90 tablet 1  . beta carotene w/minerals (OCUVITE) tablet Take 1 tablet by mouth daily.    . Calcium Citrate (CITRACAL PO) Take 2 tablets by mouth daily.    . cetirizine (ZYRTEC) 10 MG tablet Take 1 tablet (10 mg total) by mouth daily. 30 tablet 11  . clobetasol ointment (TEMOVATE) 0.05 % Apply topically 2 (two) times daily as  needed. Use for 2 weeks at time as needed.  Use at bedtime twice a week as needed for a maintenance dose. 30 g 1  . Coenzyme Q10 (COQ10) 200 MG CAPS Take 1 tablet by mouth daily.    . Ergocalciferol (VITAMIN D2) 400 units TABS Take 1 capsule by mouth daily.    Marland Kitchen esomeprazole (NEXIUM) 40 MG capsule TAKE 1 CAPSULE(40 MG) BY MOUTH DAILY 90 capsule 1  . fish oil-omega-3 fatty acids 1000 MG capsule Take 2 g by mouth daily.    . fluticasone (FLONASE) 50 MCG/ACT nasal spray Place 2 sprays into both nostrils daily.    Marland Kitchen lactobacillus acidophilus (BACID) TABS tablet Take 2 tablets by mouth 2 (two) times a week.    . Multiple Vitamin (MULTIVITAMIN) capsule Take 1 capsule by mouth daily.    Marland Kitchen PROAIR HFA 108 (90 Base) MCG/ACT inhaler INL 2 PFS PO Q 4 TO 6 H PRN  0  . valsartan (DIOVAN) 80 MG tablet TAKE 1 TABLET(80 MG) BY MOUTH DAILY 90 tablet 1  . EPIPEN 2-PAK 0.3 MG/0.3ML SOAJ injection Inject 1 mg into the muscle as needed. Reported on 06/28/2016 (Patient not taking: Reported on 05/23/2021)     No current facility-administered medications for this visit.     ALLERGIES: Evista [raloxifene], Amoxicillin-pot clavulanate, Fosamax [alendronate], Other, Shellfish allergy, and Singulair [montelukast]  Family History  Problem Relation Age of Onset  . Hypertension Mother   . Diabetes Father   . Hypertension Father   . Heart disease Father   . Diabetes Sister   . Hypertension Sister   . Schizophrenia Sister   . Thyroid disease Sister   . Heart disease Sister   . Throat cancer Paternal Uncle        smoker  . Lung cancer Cousin        smoker  . Colon cancer Neg Hx   . Pancreatic cancer Neg Hx   . Stomach cancer Neg Hx   . Liver disease Neg Hx   . Colon polyps Neg Hx   . Esophageal cancer Neg Hx   . Rectal cancer Neg Hx     Social History   Socioeconomic History  . Marital status: Married    Spouse name: Not on file  . Number of children: 1  . Years of education: Not on file  . Highest  education level: Not on file  Occupational History  . Occupation: retired  Tobacco Use  . Smoking status: Never Smoker  . Smokeless tobacco: Never Used  Vaping Use  . Vaping Use: Never used  Substance and Sexual Activity  . Alcohol use: Yes  Alcohol/week: 2.0 standard drinks    Types: 2 Glasses of wine per week  . Drug use: No  . Sexual activity: Yes    Partners: Male    Birth control/protection: Surgical    Comment: TVH--Still has ovaries  Other Topics Concern  . Not on file  Social History Narrative   ** Merged History Encounter **       Social Determinants of Health   Financial Resource Strain: Low Risk   . Difficulty of Paying Living Expenses: Not hard at all  Food Insecurity: No Food Insecurity  . Worried About Charity fundraiser in the Last Year: Never true  . Ran Out of Food in the Last Year: Never true  Transportation Needs: No Transportation Needs  . Lack of Transportation (Medical): No  . Lack of Transportation (Non-Medical): No  Physical Activity: Insufficiently Active  . Days of Exercise per Week: 3 days  . Minutes of Exercise per Session: 30 min  Stress: No Stress Concern Present  . Feeling of Stress : Not at all  Social Connections: Socially Integrated  . Frequency of Communication with Friends and Family: More than three times a week  . Frequency of Social Gatherings with Friends and Family: More than three times a week  . Attends Religious Services: More than 4 times per year  . Active Member of Clubs or Organizations: Yes  . Attends Archivist Meetings: More than 4 times per year  . Marital Status: Married  Human resources officer Violence: Not At Risk  . Fear of Current or Ex-Partner: No  . Emotionally Abused: No  . Physically Abused: No  . Sexually Abused: No    Review of Systems  Constitutional: Negative.   HENT: Negative.   Eyes: Negative.   Respiratory: Negative.   Cardiovascular: Negative.   Gastrointestinal: Negative.    Endocrine: Negative.   Genitourinary: Negative.   Musculoskeletal: Negative.   Skin: Negative.   Allergic/Immunologic: Negative.   Neurological: Negative.   Hematological: Negative.   Psychiatric/Behavioral: Negative.     PHYSICAL EXAMINATION:    BP 118/70   Pulse 72   Resp 16   Wt 146 lb (66.2 kg)   LMP 12/16/1993 (Approximate)   BMI 25.86 kg/m     General appearance: alert, cooperative and appears stated age    Pelvic: External genitalia:  Perineum with white change to skin.  Skin looks intact today with no ulceration.    Vulvar biopsy Consent for procedure.  Sterile prep with Hibiclens.  1% lidocaine, lot [id number], exp 10/23.  3 mm punch biopsy done.  Tissue to pathology.  Single suture of 3/0 Vicryl.  Minimal bleeding.  No complications.              Chaperone was present for exam.  ASSESSMENT  Vulvar lesion.  Area looks improved.  I suspect lichen sclerosus.  PLAN  FU biopsy result. Post biopsy precautions given.  I anticipate that using the clobetasol ointment twice a week will help to control symptoms of lichen sclerosus.  Fu prn.

## 2021-05-23 NOTE — Patient Instructions (Signed)
sVulva Biopsy, Care After This sheet gives you information about how to care for yourself after your procedure. Your health care provider may also give you more specific instructions. If you have problems or questions, contact your health care provider. What can I expect after the procedure? After the procedure, it is common to have:  Slight bleeding from the biopsy site.  Discomfort at the biopsy site. Follow these instructions at home: Biopsy site care  Follow instructions from your health care provider about how to take care of your biopsy site. Make sure you: ? Clean the area using water and mild soap twice a day or as told by your health care provider. Gently pat the area dry. ? If you were prescribed an antibiotic ointment, apply it as told by your health care provider. Do not stop using the antibiotic even if your condition improves. ? Take a warm water bath (sitz bath) as needed to help with pain and discomfort. A sitz bath is taken while you are sitting down. The water should only come up to your hips and should cover your buttocks. ? Leave stitches (sutures), skin glue, or adhesive strips in place. These skin closures may need to stay in place for 2 weeks or longer. If adhesive strip edges start to loosen and curl up, you may trim the loose edges. Do not remove adhesive strips completely unless your health care provider tells you to do that.  Check your biopsy site every day for signs of infection. Check for: ? More redness, swelling, or pain. ? More fluid or blood. ? Warmth. ? Pus or a bad smell.  Do not rub the biopsy area after urinating. Gently pat the area dry or use a bottle filled with warm water (peri-bottle) to clean the area. Gently wipe from front to back.   Lifestyle  Wear loose, cotton underwear. Do not wear tight pants.  Do not use a tampon, douche, or put anything inside your vagina for at least 1 week or until your health care provider approves.  Do not have  sex for at least 1 week or until your health care provider approves.  Do not exercise, such as running or biking, until your health care provider approves.  Do not swim or use a hot tub until your health care provider approves. You may shower or take a sitz bath. General instructions  Take over-the-counter and prescription medicines only as told by your health care provider.  Use a sanitary napkin until the bleeding stops.  Keep all follow-up visits as told by your health care provider. This is important. Contact a health care provider if:  You have more redness, swelling, or pain around your biopsy site.  You have more fluid or blood coming from your biopsy site.  Your biopsy site feels warm to the touch.  Your pain is not controlled with medicine. Get help right away if you have:  Heavy bleeding from the vulva.  Pus or a bad smell coming from your biopsy site.  A fever.  Lower abdominal pain. Summary  After the procedure, it is common to have slight bleeding and discomfort at the biopsy site.  Follow instructions from your health care provider after your biopsy. Make sure you clean the area with water and mild soap. Pat the area dry.  Take sitz baths as needed to help with pain and discomfort. Leave any sutures in place.  Check your biopsy site for signs of infection, which may include more redness, swelling, pain,  fluid, or blood, or feeling warm to the touch.  Get help right away if you have heavy bleeding, a fever, pus or a bad smell, or pain in the lower abdomen. This information is not intended to replace advice given to you by your health care provider. Make sure you discuss any questions you have with your health care provider. Document Revised: 06/04/2018 Document Reviewed: 06/04/2018 Elsevier Patient Education  2021 [employer].

## 2021-05-25 LAB — SURGICAL PATHOLOGY

## 2021-06-01 ENCOUNTER — Encounter: Payer: Self-pay | Admitting: Obstetrics and Gynecology

## 2021-06-04 ENCOUNTER — Ambulatory Visit: Payer: Medicare Other | Admitting: Obstetrics and Gynecology

## 2021-06-13 ENCOUNTER — Encounter: Payer: Self-pay | Admitting: *Deleted

## 2021-06-13 ENCOUNTER — Other Ambulatory Visit: Payer: Self-pay | Admitting: Family Medicine

## 2021-06-13 DIAGNOSIS — I10 Essential (primary) hypertension: Secondary | ICD-10-CM

## 2021-06-18 ENCOUNTER — Other Ambulatory Visit: Payer: Self-pay | Admitting: Family Medicine

## 2021-06-18 DIAGNOSIS — I10 Essential (primary) hypertension: Secondary | ICD-10-CM

## 2021-07-01 ENCOUNTER — Other Ambulatory Visit: Payer: Self-pay | Admitting: Family Medicine

## 2021-07-08 ENCOUNTER — Encounter: Payer: Self-pay | Admitting: Family Medicine

## 2021-07-08 ENCOUNTER — Encounter: Payer: Self-pay | Admitting: Obstetrics and Gynecology

## 2021-07-10 ENCOUNTER — Encounter: Payer: Self-pay | Admitting: Registered Nurse

## 2021-07-10 ENCOUNTER — Ambulatory Visit (INDEPENDENT_AMBULATORY_CARE_PROVIDER_SITE_OTHER): Payer: Medicare Other | Admitting: Registered Nurse

## 2021-07-10 ENCOUNTER — Other Ambulatory Visit: Payer: Self-pay

## 2021-07-10 VITALS — BP 117/51 | HR 64 | Temp 97.7°F | Resp 18 | Ht 63.0 in | Wt 145.4 lb

## 2021-07-10 DIAGNOSIS — R1032 Left lower quadrant pain: Secondary | ICD-10-CM

## 2021-07-10 LAB — COMPREHENSIVE METABOLIC PANEL
ALT: 15 U/L (ref 0–35)
AST: 19 U/L (ref 0–37)
Albumin: 4.3 g/dL (ref 3.5–5.2)
Alkaline Phosphatase: 60 U/L (ref 39–117)
BUN: 13 mg/dL (ref 6–23)
CO2: 28 mEq/L (ref 19–32)
Calcium: 9.6 mg/dL (ref 8.4–10.5)
Chloride: 104 mEq/L (ref 96–112)
Creatinine, Ser: 0.92 mg/dL (ref 0.40–1.20)
GFR: 60.97 mL/min (ref >60.00)
Glucose, Bld: 96 mg/dL (ref 70–99)
Potassium: 3.7 mEq/L (ref 3.5–5.1)
Sodium: 140 mEq/L (ref 135–145)
Total Bilirubin: 0.4 mg/dL (ref 0.2–1.2)
Total Protein: 7 g/dL (ref 6.0–8.3)

## 2021-07-10 LAB — CBC WITH DIFFERENTIAL/PLATELET
Basophils Absolute: 0 10*3/uL (ref 0.0–0.1)
Basophils Relative: 0.7 % (ref 0.0–3.0)
Eosinophils Absolute: 0.1 10*3/uL (ref 0.0–0.7)
Eosinophils Relative: 2.7 % (ref 0.0–5.0)
HCT: 39.8 % (ref 36.0–46.0)
Hemoglobin: 13.5 g/dL (ref 12.0–15.0)
Lymphocytes Relative: 31.2 % (ref 12.0–46.0)
Lymphs Abs: 1.4 10*3/uL (ref 0.7–4.0)
MCHC: 33.9 g/dL (ref 30.0–36.0)
MCV: 88.8 fl (ref 78.0–100.0)
Monocytes Absolute: 0.3 10*3/uL (ref 0.1–1.0)
Monocytes Relative: 6.4 % (ref 3.0–12.0)
Neutro Abs: 2.6 10*3/uL (ref 1.4–7.7)
Neutrophils Relative %: 59 % (ref 43.0–77.0)
Platelets: 208 10*3/uL (ref 150.0–400.0)
RBC: 4.48 Mil/uL (ref 3.87–5.11)
RDW: 14 % (ref 11.5–15.5)
WBC: 4.4 10*3/uL (ref 4.0–10.5)

## 2021-07-10 NOTE — Patient Instructions (Addendum)
Ms. Sarah [name] to meet you, sorry that you're not feeling well!  I'm greatly reassured by your symptoms today  Lets check CBC and CMP to give further peace of mind  Keep in mind BRAT diet - bananas, rice, apple sauce, toast, and other basic foods that won't upset your stomach.  Hydrate! Try to get between 1/2 gallon and 3/4 gallon daily.   Let me know if there's blood or mucus in stool, constipation or diarrhea that won't stop, an extremely rigid abdomen, fever over 101.4, or vomiting that won't stop   Thank you  Rich     If you have lab work done today you will be contacted with your lab results within the next 2 weeks.  If you have not heard from [place] then please contact us. The fastest way to get your results is to register for My Chart.   IF you received an x-ray today, you will receive an invoice from [facility]. Please contact [facility] at [phone] with questions or concerns regarding your invoice.   IF you received labwork today, you will receive an invoice from [facility]. Please contact [facility] at [phone] with questions or concerns regarding your invoice.   Our billing staff will not be able to assist you with questions regarding bills from these companies.  You will be contacted with the lab results as soon as they are available. The fastest way to get your results is to activate your My Chart account. Instructions are located on the last page of this paperwork. If you have not heard from [place] regarding the results in 2 weeks, please contact this office.

## 2021-07-10 NOTE — Progress Notes (Signed)
Acute Office Visit  Subjective:    Patient ID: Sarah [name], female    DOB: 11-Aug-1946, 75 y.o.   MRN: [id number]  Chief Complaint  Patient presents with   Diverticulitis    Patient states she thinks she is having a diverticulitis flare up from the things she ate. Patient having pain on the left side.    HPI Patient is in today for LLQ pain  Hx of pelvic floor mesh implant - was not completely covered with tissue, is concerned that bare mesh is affecting symptoms. At around age 56 had emergency appendectomy that was likely related to mesh  Pain has been on and off for past few days. BM has been normal. No nv. LLQ pain sharp and intermittent. Appetite has been a little lower.   Imaging reviewed from Sept 2021, no acute findings at that time.   No blood or mucus in stool.   Otherwise no concerns.   Past Medical History:  Diagnosis Date   Allergy    Arthritis    Asthma    Cancer ([place])    basal cell cancer--nose   Cataract    left eye and stable    Cervical disc disease    bulging   Diverticulitis    Diverticulosis    GERD (gastroesophageal reflux disease)    Hyperlipidemia    Hypertension    Osteopenia    Osteoporosis    PONV (postoperative nausea and vomiting)    Status post dilation of esophageal narrowing     Past Surgical History:  Procedure Laterality Date   ABDOMINAL HYSTERECTOMY  1995   TVH--still has ovaries   ABDOMINAL SACROCOLPOPEXY  2007   Halban culdoplasty & Burch procedure--Dr. Quincy Simmonds   APPENDECTOMY     Dr. Hassell Done   arthroscopic shoulder Left    Dr. Durward Fortes, bone spur   BLADDER SUSPENSION     CERVICAL FUSION     C-4, C-5 fusion   CHOLECYSTECTOMY     Dr. Georgina Quint   ESOPHAGEAL DILATION     HIP ARTHROPLASTY Left 11/07/2013   Procedure: ARTHROPLASTY BIPOLAR HIP;  Surgeon: Garald Balding, MD;  Location: [facility];  Service: Orthopedics;  Laterality: Left;   KNEE ARTHROSCOPY Bilateral    Dr. Durward Fortes   OTHER SURGICAL HISTORY  2011    posterior Colporrhaphy with Xenform biological graft--Dr. Allyn Kenner REPAIR  2011   posterior colporrhaphy with Xenform biological graft--Dr. Quincy Simmonds   UPPER GASTROINTESTINAL ENDOSCOPY      Family History  Problem Relation Age of Onset   Hypertension Mother    Diabetes Father    Hypertension Father    Heart disease Father    Diabetes Sister    Hypertension Sister    Schizophrenia Sister    Thyroid disease Sister    Heart disease Sister    Throat cancer Paternal Uncle        smoker   Lung cancer Cousin        smoker   Colon cancer Neg Hx    Pancreatic cancer Neg Hx    Stomach cancer Neg Hx    Liver disease Neg Hx    Colon polyps Neg Hx    Esophageal cancer Neg Hx    Rectal cancer Neg Hx     Social History   Socioeconomic History   Marital status: Married    Spouse name: Not on file   Number of children: 1   Years of education: Not on file   Highest  education level: Not on file  Occupational History   Occupation: retired  Tobacco Use   Smoking status: Never   Smokeless tobacco: Never  Vaping Use   Vaping Use: Never used  Substance and Sexual Activity   Alcohol use: Yes    Alcohol/week: 2.0 standard drinks    Types: 2 Glasses of wine per week   Drug use: No   Sexual activity: Yes    Partners: Male    Birth control/protection: Surgical    Comment: TVH--Still has ovaries  Other Topics Concern   Not on file  Social History Narrative   ** Merged History Encounter **       Social Determinants of Health   Financial Resource Strain: Low Risk    Difficulty of Paying Living Expenses: Not hard at all  Food Insecurity: No Food Insecurity   Worried About Charity fundraiser in the Last Year: Never true   [place] in the Last Year: Never true  Transportation Needs: No Transportation Needs   Lack of Transportation (Medical): No   Lack of Transportation (Non-Medical): No  Physical Activity: Insufficiently Active   Days of Exercise per Week: 3 days    Minutes of Exercise per Session: 30 min  Stress: No Stress Concern Present   Feeling of Stress : Not at all  Social Connections: Socially Integrated   Frequency of Communication with Friends and Family: More than three times a week   Frequency of Social Gatherings with Friends and Family: More than three times a week   Attends Religious Services: More than 4 times per year   Active Member of [employer] or Organizations: Yes   Attends Music therapist: More than 4 times per year   Marital Status: Married  Human resources officer Violence: Not At Risk   Fear of Current or Ex-Partner: No   Emotionally Abused: No   Physically Abused: No   Sexually Abused: No    Outpatient Medications Prior to Visit  Medication Sig Dispense Refill   Ascorbic Acid (VITAMIN C PO) Take by mouth.     atorvastatin (LIPITOR) 10 MG tablet TAKE 1 TAKE BY MOUTH EVERY 3 DAYS 90 tablet 1   beta carotene w/minerals (OCUVITE) tablet Take 1 tablet by mouth daily.     Calcium Citrate (CITRACAL PO) Take 2 tablets by mouth daily.     cetirizine (ZYRTEC) 10 MG tablet Take 1 tablet (10 mg total) by mouth daily. 30 tablet 11   clobetasol ointment (TEMOVATE) 0.05 % Apply topically 2 (two) times daily as needed. Use for 2 weeks at time as needed.  Use at bedtime twice a week as needed for a maintenance dose. 30 g 1   EPIPEN 2-PAK 0.3 MG/0.3ML SOAJ injection Inject 1 mg into the muscle as needed. Reported on 06/28/2016     Ergocalciferol (VITAMIN D2) 400 units TABS Take 1 capsule by mouth daily.     esomeprazole (NEXIUM) 40 MG capsule TAKE 1 CAPSULE(40 MG) BY MOUTH DAILY 90 capsule 1   fish oil-omega-3 fatty acids 1000 MG capsule Take 2 g by mouth daily.     fluticasone (FLONASE) 50 MCG/ACT nasal spray Place 2 sprays into both nostrils daily.     lactobacillus acidophilus (BACID) TABS tablet Take 2 tablets by mouth 2 (two) times a week.     Multiple Vitamin (MULTIVITAMIN) capsule Take 1 capsule by mouth daily.     PROAIR HFA 108  (90 Base) MCG/ACT inhaler INL 2 PFS PO Q 4  TO 6 H PRN  0   valsartan (DIOVAN) 80 MG tablet TAKE 1 TABLET(80 MG) BY MOUTH DAILY 90 tablet 0   Coenzyme Q10 (COQ10) 200 MG CAPS Take 1 tablet by mouth daily. (Patient not taking: Reported on 07/10/2021)     No facility-administered medications prior to visit.    Allergies  Allergen Reactions   Evista [Raloxifene]     Respiratory infections, shortness of breath(not anaphylaxis), leg heaviness.   Amoxicillin-Pot Clavulanate Hives and Other (See Comments)   Fosamax [Alendronate] Other (See Comments)    "Due to Esophagus problems"--narrow   Other Other (See Comments)   Shellfish Allergy Hives   Singulair [Montelukast] Other (See Comments)    unknown    Review of Systems  Constitutional: Negative.   HENT: Negative.    Eyes: Negative.   Respiratory: Negative.    Cardiovascular: Negative.   Gastrointestinal:  Positive for abdominal pain. Negative for abdominal distention, anal bleeding, blood in stool, constipation, diarrhea, nausea, rectal pain and vomiting.  Genitourinary: Negative.   Musculoskeletal: Negative.   Skin: Negative.   Neurological: Negative.   Psychiatric/Behavioral: Negative.    All other systems reviewed and are negative.     Objective:    Physical Exam Vitals and nursing note reviewed.  Constitutional:      General: She is not in acute distress.    Appearance: Normal appearance. She is not ill-appearing, toxic-appearing or diaphoretic.  Cardiovascular:     Rate and Rhythm: Normal rate and regular rhythm.     Pulses: Normal pulses.     Heart sounds: Normal heart sounds. No murmur heard.   No friction rub. No gallop.  Pulmonary:     Effort: Pulmonary effort is normal. No respiratory distress.     Breath sounds: Normal breath sounds. No stridor. No wheezing, rhonchi or rales.  Chest:     Chest wall: No tenderness.  Abdominal:     Tenderness: There is abdominal tenderness (LLq).  Skin:    General: Skin is warm  and dry.     Capillary Refill: Capillary refill takes less than 2 seconds.  Neurological:     General: No focal deficit present.     Mental Status: She is alert and oriented to person, place, and time. Mental status is at baseline.  Psychiatric:        Mood and Affect: Mood normal.        Behavior: Behavior normal.        Thought Content: Thought content normal.        Judgment: Judgment normal.    BP (!) 117/51   Pulse 64   Temp 97.7 F (36.5 C) (Temporal)   Resp 18   Ht '5\' 3"'$  (1.6 m)   Wt 145 lb 6.4 oz (66 kg)   LMP 12/16/1993 (Approximate)   BMI 25.76 kg/m  Wt Readings from Last 3 Encounters:  07/10/21 145 lb 6.4 oz (66 kg)  05/23/21 146 lb (66.2 kg)  05/03/21 145 lb (65.8 kg)    There are no preventive care reminders to display for this patient.  There are no preventive care reminders to display for this patient.   Lab Results  Component Value Date   TSH 1.41 05/25/2020   Lab Results  Component Value Date   WBC 4.4 07/10/2021   HGB 13.5 07/10/2021   HCT 39.8 07/10/2021   MCV 88.8 07/10/2021   PLT 208.0 07/10/2021   Lab Results  Component Value Date   NA 140 07/10/2021  K 3.7 07/10/2021   CO2 28 07/10/2021   GLUCOSE 96 07/10/2021   BUN 13 07/10/2021   CREATININE 0.92 07/10/2021   BILITOT 0.4 07/10/2021   ALKPHOS 60 07/10/2021   AST 19 07/10/2021   ALT 15 07/10/2021   PROT 7.0 07/10/2021   ALBUMIN 4.3 07/10/2021   CALCIUM 9.6 07/10/2021   ANIONGAP 7 11/16/2018   GFR 60.97 07/10/2021   Lab Results  Component Value Date   CHOL 195 05/25/2020   Lab Results  Component Value Date   HDL 67.80 05/25/2020   Lab Results  Component Value Date   LDLCALC 103 (H) 05/25/2020   Lab Results  Component Value Date   TRIG 125.0 05/25/2020   Lab Results  Component Value Date   CHOLHDL 3 05/25/2020   No results found for: HGBA1C     Assessment & Plan:   Problem List Items Addressed This Visit   None Visit Diagnoses     LLQ pain    -  Primary    Relevant Orders   CBC with Differential/Platelet (Completed)   Comprehensive metabolic panel (Completed)        No orders of the defined types were placed in this encounter.  PLAN Perhaps mild diverticulitis but limited concern as she is afebrile, no blood in stool, no mucus in stool.  Reviewed updated guidelines for acute diverticulitis treatment. Encourage mostly liquid diet with some bland foods. Will draw CBC and CMP to ensure no acute lab abnormalities. Rtc and ER precautions reviewed with patient who voices understanding Patient encouraged to call clinic with any questions, comments, or concerns.   Maximiano Coss, NP

## 2021-08-02 ENCOUNTER — Ambulatory Visit (INDEPENDENT_AMBULATORY_CARE_PROVIDER_SITE_OTHER): Payer: Medicare Other | Admitting: Registered Nurse

## 2021-08-02 ENCOUNTER — Other Ambulatory Visit: Payer: Self-pay

## 2021-08-02 ENCOUNTER — Encounter: Payer: Self-pay | Admitting: Registered Nurse

## 2021-08-02 VITALS — BP 126/80 | HR 63 | Temp 97.7°F | Resp 17 | Ht 63.0 in | Wt 145.4 lb

## 2021-08-02 DIAGNOSIS — R109 Unspecified abdominal pain: Secondary | ICD-10-CM | POA: Diagnosis not present

## 2021-08-02 DIAGNOSIS — R319 Hematuria, unspecified: Secondary | ICD-10-CM

## 2021-08-02 LAB — POCT URINALYSIS DIP (MANUAL ENTRY)
Bilirubin, UA: NEGATIVE
Glucose, UA: NEGATIVE mg/dL
Ketones, POC UA: NEGATIVE mg/dL
Nitrite, UA: NEGATIVE
Protein Ur, POC: NEGATIVE mg/dL
Spec Grav, UA: 1.01 (ref 1.010–1.025)
Urobilinogen, UA: 0.2 E.U./dL
pH, UA: 5 (ref 5.0–8.0)

## 2021-08-02 MED ORDER — SULFAMETHOXAZOLE-TRIMETHOPRIM 800-160 MG PO TABS: 1.0000 | ORAL_TABLET | Freq: Two times a day (BID) | ORAL | 0 refills | Status: DC

## 2021-08-02 NOTE — Progress Notes (Signed)
Established Patient Office Visit  Subjective:  Patient ID: Sarah [name], female    DOB: 04-Jun-1946  Age: 75 y.o. MRN: [id number]  CC:  Chief Complaint  Patient presents with   Abdominal Pain    Patient states abd pain isn't any better.     HPI Sarah [name] presents for ongoing abdominal pain  Seen on 07/10/21 for LLQ pain Suspected diverticulitis. Pursued bland diet, rest, pain control  Unfortunately pain persistent throughout.  Hx notable for colon polyp - 5 year recall for colonoscopy - due in Oct 2023 Notable for diverticulosis confirmed on multiple colonoscopies.   CT abd/pelvis in Sept 2021 showed no acute findings. Did not "a few tiny bilateral renal cysts".  Denies changes to bowel movements, urinary symptoms, vaginal symptoms.  Still sharp intermittent pain in LLQ  Past Medical History:  Diagnosis Date   Allergy    Arthritis    Asthma    Cancer ([place])    basal cell cancer--nose   Cataract    left eye and stable    Cervical disc disease    bulging   Diverticulitis    Diverticulosis    GERD (gastroesophageal reflux disease)    Hyperlipidemia    Hypertension    Osteopenia    Osteoporosis    PONV (postoperative nausea and vomiting)    Status post dilation of esophageal narrowing     Past Surgical History:  Procedure Laterality Date   ABDOMINAL HYSTERECTOMY  1995   TVH--still has ovaries   ABDOMINAL SACROCOLPOPEXY  2007   Halban culdoplasty & Burch procedure--Dr. Quincy Simmonds   APPENDECTOMY     Dr. Hassell Done   arthroscopic shoulder Left    Dr. Durward Fortes, bone spur   BLADDER SUSPENSION     CERVICAL FUSION     C-4, C-5 fusion   CHOLECYSTECTOMY     Dr. Georgina Quint   ESOPHAGEAL DILATION     HIP ARTHROPLASTY Left 11/07/2013   Procedure: ARTHROPLASTY BIPOLAR HIP;  Surgeon: Garald Balding, MD;  Location: [facility];  Service: Orthopedics;  Laterality: Left;   KNEE ARTHROSCOPY Bilateral    Dr. Durward Fortes   OTHER SURGICAL HISTORY  2011   posterior  Colporrhaphy with Xenform biological graft--Dr. Allyn Kenner REPAIR  2011   posterior colporrhaphy with Xenform biological graft--Dr. Quincy Simmonds   UPPER GASTROINTESTINAL ENDOSCOPY      Family History  Problem Relation Age of Onset   Hypertension Mother    Diabetes Father    Hypertension Father    Heart disease Father    Diabetes Sister    Hypertension Sister    Schizophrenia Sister    Thyroid disease Sister    Heart disease Sister    Throat cancer Paternal Uncle        smoker   Lung cancer Cousin        smoker   Colon cancer Neg Hx    Pancreatic cancer Neg Hx    Stomach cancer Neg Hx    Liver disease Neg Hx    Colon polyps Neg Hx    Esophageal cancer Neg Hx    Rectal cancer Neg Hx     Social History   Socioeconomic History   Marital status: Married    Spouse name: Not on file   Number of children: 1   Years of education: Not on file   Highest education level: Not on file  Occupational History   Occupation: retired  Tobacco Use   Smoking status: Never  Smokeless tobacco: Never  Vaping Use   Vaping Use: Never used  Substance and Sexual Activity   Alcohol use: Yes    Alcohol/week: 2.0 standard drinks    Types: 2 Glasses of wine per week   Drug use: No   Sexual activity: Yes    Partners: Male    Birth control/protection: Surgical    Comment: TVH--Still has ovaries  Other Topics Concern   Not on file  Social History Narrative   ** Merged History Encounter **       Social Determinants of Health   Financial Resource Strain: Low Risk    Difficulty of Paying Living Expenses: Not hard at all  Food Insecurity: No Food Insecurity   Worried About Charity fundraiser in the Last Year: Never true   [place] in the Last Year: Never true  Transportation Needs: No Transportation Needs   Lack of Transportation (Medical): No   Lack of Transportation (Non-Medical): No  Physical Activity: Insufficiently Active   Days of Exercise per Week: 3 days   Minutes of  Exercise per Session: 30 min  Stress: No Stress Concern Present   Feeling of Stress : Not at all  Social Connections: Socially Integrated   Frequency of Communication with Friends and Family: More than three times a week   Frequency of Social Gatherings with Friends and Family: More than three times a week   Attends Religious Services: More than 4 times per year   Active Member of [employer] or Organizations: Yes   Attends Music therapist: More than 4 times per year   Marital Status: Married  Human resources officer Violence: Not At Risk   Fear of Current or Ex-Partner: No   Emotionally Abused: No   Physically Abused: No   Sexually Abused: No    Outpatient Medications Prior to Visit  Medication Sig Dispense Refill   Ascorbic Acid (VITAMIN C PO) Take by mouth.     atorvastatin (LIPITOR) 10 MG tablet TAKE 1 TAKE BY MOUTH EVERY 3 DAYS 90 tablet 1   beta carotene w/minerals (OCUVITE) tablet Take 1 tablet by mouth daily.     Calcium Citrate (CITRACAL PO) Take 2 tablets by mouth daily.     cetirizine (ZYRTEC) 10 MG tablet Take 1 tablet (10 mg total) by mouth daily. 30 tablet 11   clobetasol ointment (TEMOVATE) 0.05 % Apply topically 2 (two) times daily as needed. Use for 2 weeks at time as needed.  Use at bedtime twice a week as needed for a maintenance dose. 30 g 1   EPIPEN 2-PAK 0.3 MG/0.3ML SOAJ injection Inject 1 mg into the muscle as needed. Reported on 06/28/2016     Ergocalciferol (VITAMIN D2) 400 units TABS Take 1 capsule by mouth daily.     esomeprazole (NEXIUM) 40 MG capsule TAKE 1 CAPSULE(40 MG) BY MOUTH DAILY 90 capsule 1   fish oil-omega-3 fatty acids 1000 MG capsule Take 2 g by mouth daily.     fluticasone (FLONASE) 50 MCG/ACT nasal spray Place 2 sprays into both nostrils daily.     lactobacillus acidophilus (BACID) TABS tablet Take 2 tablets by mouth 2 (two) times a week.     Multiple Vitamin (MULTIVITAMIN) capsule Take 1 capsule by mouth daily.     PROAIR HFA 108 (90 Base)  MCG/ACT inhaler INL 2 PFS PO Q 4 TO 6 H PRN  0   valsartan (DIOVAN) 80 MG tablet TAKE 1 TABLET(80 MG) BY MOUTH DAILY 90 tablet  0   Coenzyme Q10 (COQ10) 200 MG CAPS Take 1 tablet by mouth daily. (Patient not taking: No sig reported)     No facility-administered medications prior to visit.    Allergies  Allergen Reactions   Evista [Raloxifene]     Respiratory infections, shortness of breath(not anaphylaxis), leg heaviness.   Amoxicillin-Pot Clavulanate Hives and Other (See Comments)   Fosamax [Alendronate] Other (See Comments)    "Due to Esophagus problems"--narrow   Other Other (See Comments)   Shellfish Allergy Hives   Singulair [Montelukast] Other (See Comments)    unknown    ROS Review of Systems  Constitutional: Negative.   HENT: Negative.    Eyes: Negative.   Respiratory: Negative.    Cardiovascular: Negative.   Gastrointestinal: Negative.   Genitourinary: Negative.   Musculoskeletal: Negative.   Skin: Negative.   Neurological: Negative.   Psychiatric/Behavioral: Negative.    All other systems reviewed and are negative.    Objective:    Physical Exam Vitals and nursing note reviewed.  Constitutional:      General: She is not in acute distress.    Appearance: Normal appearance. She is normal weight. She is not ill-appearing, toxic-appearing or diaphoretic.  Cardiovascular:     Rate and Rhythm: Normal rate and regular rhythm.     Heart sounds: Normal heart sounds. No murmur heard.   No friction rub. No gallop.  Pulmonary:     Effort: Pulmonary effort is normal. No respiratory distress.     Breath sounds: Normal breath sounds. No stridor. No wheezing, rhonchi or rales.  Chest:     Chest wall: No tenderness.  Abdominal:     Tenderness: There is abdominal tenderness in the left lower quadrant. There is no right CVA tenderness, left CVA tenderness, guarding or rebound. Negative signs include Murphy's sign, Rovsing's sign, McBurney's sign, psoas sign and obturator  sign.     Hernia: No hernia is present.  Skin:    General: Skin is warm and dry.  Neurological:     General: No focal deficit present.     Mental Status: She is alert and oriented to person, place, and time. Mental status is at baseline.  Psychiatric:        Mood and Affect: Mood normal.        Behavior: Behavior normal.        Thought Content: Thought content normal.        Judgment: Judgment normal.    BP 126/80   Pulse 63   Temp 97.7 F (36.5 C) (Temporal)   Resp 17   Ht '5\' 3"'$  (1.6 m)   Wt 145 lb 6.4 oz (66 kg)   LMP 12/16/1993 (Approximate)   SpO2 96%   BMI 25.76 kg/m  Wt Readings from Last 3 Encounters:  08/02/21 145 lb 6.4 oz (66 kg)  07/10/21 145 lb 6.4 oz (66 kg)  05/23/21 146 lb (66.2 kg)     Health Maintenance Due  Topic Date Due   INFLUENZA VACCINE  07/16/2021    There are no preventive care reminders to display for this patient.  Lab Results  Component Value Date   TSH 1.41 05/25/2020   Lab Results  Component Value Date   WBC 4.4 07/10/2021   HGB 13.5 07/10/2021   HCT 39.8 07/10/2021   MCV 88.8 07/10/2021   PLT 208.0 07/10/2021   Lab Results  Component Value Date   NA 140 07/10/2021   K 3.7 07/10/2021   CO2 28 07/10/2021  GLUCOSE 96 07/10/2021   BUN 13 07/10/2021   CREATININE 0.92 07/10/2021   BILITOT 0.4 07/10/2021   ALKPHOS 60 07/10/2021   AST 19 07/10/2021   ALT 15 07/10/2021   PROT 7.0 07/10/2021   ALBUMIN 4.3 07/10/2021   CALCIUM 9.6 07/10/2021   ANIONGAP 7 11/16/2018   GFR 60.97 07/10/2021   Lab Results  Component Value Date   CHOL 195 05/25/2020   Lab Results  Component Value Date   HDL 67.80 05/25/2020   Lab Results  Component Value Date   LDLCALC 103 (H) 05/25/2020   Lab Results  Component Value Date   TRIG 125.0 05/25/2020   Lab Results  Component Value Date   CHOLHDL 3 05/25/2020   No results found for: HGBA1C    Assessment & Plan:   Problem List Items Addressed This Visit   None Visit Diagnoses      Abdominal pain, unspecified abdominal location    -  Primary   Relevant Orders   POCT urinalysis dipstick (Completed)   Ambulatory referral to Gastroenterology   Hematuria, unspecified type       Relevant Medications   sulfamethoxazole-trimethoprim (BACTRIM DS) 800-160 MG tablet   Other Relevant Orders   Urine Culture   CT Abdomen Pelvis W Contrast       Meds ordered this encounter  Medications   sulfamethoxazole-trimethoprim (BACTRIM DS) 800-160 MG tablet    Sig: Take 1 tablet by mouth 2 (two) times daily.    Dispense:  6 tablet    Refill:  0    Order Specific Question:   Supervising Provider    Answer:   Carlota Raspberry, JEFFREY R [id number]    Follow-up: Return if symptoms worsen or fail to improve.   PLAN Urine dip shows much blood. Will culture and start bactrim po bid x 3 days, further changes based on culture results. Order CT abd/pelvis w contrast. Some tiny cysts noted on kidneys on last imaging, want to ensure no changes. Additionally, she has hx of stones, will rule out Refer to GI Dr. Ardis Hughs for follow up. Can consider early colonoscopy with him. ER precautions given Patient encouraged to call clinic with any questions, comments, or concerns.  Maximiano Coss, NP

## 2021-08-02 NOTE — Patient Instructions (Signed)
Ms. Sarah [name] to see you, sorry to see you so soon after last time and sorry the pain is still there!  Sending bactrim to take 1 tablet twice daily for three days. Will check urine culture to see how it looks.  Blood in urine today may not be of clinical significance but better to be certain.  Will refer to Dr. Ardis Hughs - his office should call you directly sooner rather than later.  Let me know if symptoms worsen or patterns emerge.  Be seen in urgent care or ER with any frank blood in stool, particularly dark/tarry stool, vomiting that won't stop, sudden cognitive changes, or any other alarming symptoms.   Thank you  Rich

## 2021-08-03 ENCOUNTER — Encounter: Payer: Self-pay | Admitting: Nurse Practitioner

## 2021-08-03 ENCOUNTER — Encounter: Payer: Self-pay | Admitting: Registered Nurse

## 2021-08-04 LAB — URINE CULTURE
MICRO NUMBER:: 12262692
SPECIMEN QUALITY:: ADEQUATE

## 2021-08-06 ENCOUNTER — Ambulatory Visit (HOSPITAL_COMMUNITY)
Admission: RE | Admit: 2021-08-06 | Discharge: 2021-08-06 | Disposition: A | Discharge status: Home / Self Care | Payer: Medicare Other | Source: Ambulatory Visit | Attending: Registered Nurse | Admitting: Registered Nurse

## 2021-08-06 ENCOUNTER — Other Ambulatory Visit: Payer: Self-pay

## 2021-08-06 DIAGNOSIS — R319 Hematuria, unspecified: Secondary | ICD-10-CM | POA: Insufficient documentation

## 2021-08-06 MED ORDER — IOHEXOL 350 MG/ML SOLN
80.0000 mL | Freq: Once | INTRAVENOUS | Status: AC | PRN
  Administered 2021-08-06: 80 mL via INTRAVENOUS

## 2021-08-07 ENCOUNTER — Encounter: Payer: Self-pay | Admitting: Registered Nurse

## 2021-08-08 ENCOUNTER — Encounter: Payer: Self-pay | Admitting: Obstetrics and Gynecology

## 2021-08-08 NOTE — Telephone Encounter (Signed)
Please schedule an office visit with me.  I will need a 30 minute time slot.

## 2021-08-13 ENCOUNTER — Ambulatory Visit (INDEPENDENT_AMBULATORY_CARE_PROVIDER_SITE_OTHER): Payer: Medicare Other | Admitting: Obstetrics and Gynecology

## 2021-08-13 ENCOUNTER — Other Ambulatory Visit: Payer: Self-pay

## 2021-08-13 ENCOUNTER — Encounter: Payer: Self-pay | Admitting: Obstetrics and Gynecology

## 2021-08-13 VITALS — BP 138/76 | HR 73 | Ht 64.0 in | Wt 146.0 lb

## 2021-08-13 DIAGNOSIS — Z87448 Personal history of other diseases of urinary system: Secondary | ICD-10-CM

## 2021-08-13 DIAGNOSIS — R1032 Left lower quadrant pain: Secondary | ICD-10-CM

## 2021-08-13 DIAGNOSIS — R102 Pelvic and perineal pain: Secondary | ICD-10-CM | POA: Diagnosis not present

## 2021-08-13 LAB — URINALYSIS, COMPLETE W/RFL CULTURE
Bacteria, UA: NONE SEEN /HPF
Bilirubin Urine: NEGATIVE
Glucose, UA: NEGATIVE
Hgb urine dipstick: NEGATIVE
Hyaline Cast: NONE SEEN /LPF
Ketones, ur: NEGATIVE
Leukocyte Esterase: NEGATIVE
Nitrites, Initial: NEGATIVE
Protein, ur: NEGATIVE
RBC / HPF: NONE SEEN /HPF (ref 0–2)
Specific Gravity, Urine: 1.01 (ref 1.001–1.035)
WBC, UA: NONE SEEN /HPF (ref 0–5)
pH: 7 (ref 5.0–8.0)

## 2021-08-13 LAB — NO CULTURE INDICATED

## 2021-08-13 NOTE — Progress Notes (Signed)
GYNECOLOGY  VISIT   HPI: 75 y.o.   Married  Caucasian  female   [id number] with Patient's last menstrual period was 12/16/1993 (approximate).   here for LLQ discomfort x1 year. PCP ordered CT abd/pelvis and was normal per patient -- Epic. Had hematuria 2 weeks ago per PCP but negative culture. Patient has questions about her sacrocolpopexy mesh. She had a small retroperitoneal mesh exposure noted at the time of her appendectomy in 2010.  LLQ pain wakes patient up.  No diarrhea prior to the CT scan, but has developed this after the imaging was done. Sometimes has constipation.  No blood in the stool or black tarry stools.   She denies vaginal vaginal bleeding.  No bleeding with intercourse. Occasional pain.   Will see Dr. Eugenia Pancoast PA mid September.    Narrative & Impression  CLINICAL DATA:  Left lower quadrant abdominal pain for the past month.   EXAM: CT ABDOMEN AND PELVIS WITH CONTRAST   TECHNIQUE: Multidetector CT imaging of the abdomen and pelvis was performed using the standard protocol following bolus administration of intravenous contrast.   CONTRAST:  28m OMNIPAQUE IOHEXOL 350 MG/ML SOLN   COMPARISON:  CT abdomen pelvis-08/17/2020; 01/30/2009   FINDINGS: Lower chest: Limited visualization of the lower thorax demonstrates a large hiatal hernia. Minimal right basilar subsegmental atelectasis. No focal airspace opacities. No pleural effusion.   Borderline cardiomegaly.  No pericardial effusion.   Hepatobiliary: Normal hepatic contour. Scattered subcentimeter hypoattenuating hepatic lesions are too small to accurately characterize though appears similar to the 08/2020 examination and favored to represent hepatic cysts. Post cholecystectomy. There is mild prominence of the CBD presumably secondary to biliary reservoir phenomena in the setting of post cholecystectomy state. No intrahepatic biliary ductal dilatation. No ascites.   Pancreas: Punctate subcentimeter  hypoattenuating lesion within the posterior aspect of the mid body of the pancreas (axial image 19, series 2; coronal image 35, series 5), is unchanged compared to the 01/2009 examination and thus of benign etiology. No associated pancreatic ductal dilatation or downstream pancreatic atrophy.   Spleen: Normal appearance of the spleen.   Adrenals/Urinary Tract: There is symmetric enhancement and excretion of the bilateral kidneys. Bilateral subcentimeter hypoattenuating renal lesions are too small to accurately characterize though similar to the 08/2020 examination and favored to represent renal cysts. No evidence of nephrolithiasis on this postcontrast examination. Note is again made of small bilateral extrarenal pelvis sees. No urinary obstruction or perinephric stranding.   Normal appearance of the bilateral adrenal glands. Normal appearance of the urinary bladder given degree of distention, though note, evaluation degraded secondary streak artifact from the patient's left total hip prosthesis.   Stomach/Bowel: Large hiatal hernia, similar to the 08/2020 examination, though new compared to remote examination performed in 2010.   Enteric contrast extends to the level of the rectum. Scattered colonic diverticulosis without evidence of superimposed acute diverticulitis. Normal appearance of the terminal ileum. The appendix is not visualized compatible with provided operative history.   No discrete areas of bowel wall thickening, though note, evaluation of the sigmoid colon is somewhat degraded secondary to streak artifact patient's left total hip prosthesis. No evidence of enteric obstruction. No pneumoperitoneum, pneumatosis or portal venous gas.   Vascular/Lymphatic: Moderate amount of scattered predominantly calcified atherosclerotic plaque within a mildly tortuous but normal caliber abdominal aorta, not resulting in hemodynamically significant stenosis. The major branch  vessels of the abdominal aorta appear patent on this non CTA examination.   No bulky retroperitoneal, mesenteric, pelvic or inguinal  lymphadenopathy.   Reproductive: Post hysterectomy. No discrete adnexal lesions, though note, evaluation degraded secondary streak artifact from the patient's left total hip prosthesis.   Other: Tiny mesenteric fat containing periumbilical hernia.   Musculoskeletal: No acute or aggressive osseous abnormalities. Mild scoliotic curvature of the lumbar spine with moderate multilevel lumbar spine DDD. Bilateral facet degenerative change the lower lumbar spine. Post left total hip prosthesis, incompletely evaluated though imaged components are without evidence of hardware failure or loosening.   IMPRESSION: 1. Large hiatal hernia, similar to the 08/2020 examination though new compared to remote abdominal CT performed in 2010. 2. Diverticulosis without evidence of superimposed acute diverticulitis. 3. Otherwise, no explanation for patient's left lower quadrant abdominal pain. Specifically, no evidence of enteric or urinary obstruction. 4.  Aortic Atherosclerosis (ICD10-I70.0).     Electronically Signed   By: Sandi Mariscal M.D.   On: 08/07/2021 10:37       Husband is doing prostate cancer care.   GYNECOLOGIC HISTORY: Patient's last menstrual period was 12/16/1993 (approximate). Contraception: Hyst Menopausal hormone therapy:  none Last mammogram: [id number] 3D/Neg/BiRads1 Last pap smear:   06-03-11 Neg        OB History     Gravida  2   Para  1   Term  1   Preterm  0   AB  1   Living         SAB  0   IAB  0   Ectopic  0   Multiple      Live Births                 Patient Active Problem List   Diagnosis Date Noted   Pain in left knee 03/06/2021   Allergic rhinitis 02/20/2021   Allergic rhinitis due to animal (cat) (dog) hair and dander 02/20/2021   Allergic rhinitis due to pollen 02/20/2021   Bee allergy status  02/20/2021   Gastro-esophageal reflux disease without esophagitis 02/20/2021   Rash and other nonspecific skin eruption 02/20/2021   Seafood allergy 02/20/2021   Toxic effect of venom of bees, accidental (unintentional), subsequent encounter 02/20/2021   Pain in left leg 10/11/2020   Overweight (BMI 25.0-29.9) 05/25/2020   Diverticulitis 07/09/2017   Primary osteoarthritis of both hands 12/23/2016   Primary osteoarthritis of both knees 12/23/2016   Postural kyphosis of thoracic region 12/23/2016   DJD (degenerative joint disease), cervical 12/23/2016   History of gastroesophageal reflux (GERD) 12/23/2016   History of fracture of left hip 12/23/2016   Physical exam 11/04/2016   Environmental and seasonal allergies 06/27/2016   Esophageal dysphagia 06/03/2016   Osteoporosis 08/03/2014   Hyperlipidemia 11/06/2013   Hypertension 11/06/2013    Past Medical History:  Diagnosis Date   Allergy    Arthritis    Asthma    Cancer ([place])    basal cell cancer--nose   Cataract    left eye and stable    Cervical disc disease    bulging   Diverticulitis    Diverticulosis    GERD (gastroesophageal reflux disease)    Hyperlipidemia    Hypertension    Osteopenia    Osteoporosis    PONV (postoperative nausea and vomiting)    Status post dilation of esophageal narrowing     Past Surgical History:  Procedure Laterality Date   ABDOMINAL HYSTERECTOMY  1995   TVH--still has ovaries   ABDOMINAL SACROCOLPOPEXY  2007   Halban culdoplasty & Burch procedure--Dr. Quincy Simmonds   APPENDECTOMY  Dr. Hassell Done   arthroscopic shoulder Left    Dr. Durward Fortes, bone spur   BLADDER SUSPENSION     CERVICAL FUSION     C-4, C-5 fusion   CHOLECYSTECTOMY     Dr. Georgina Quint   ESOPHAGEAL DILATION     HIP ARTHROPLASTY Left 11/07/2013   Procedure: ARTHROPLASTY BIPOLAR HIP;  Surgeon: Garald Balding, MD;  Location: [place];  Service: Orthopedics;  Laterality: Left;   KNEE ARTHROSCOPY Bilateral    Dr. Durward Fortes    OTHER SURGICAL HISTORY  2011   posterior Colporrhaphy with Xenform biological graft--Dr. Allyn Kenner REPAIR  2011   posterior colporrhaphy with Xenform biological graft--Dr. Quincy Simmonds   UPPER GASTROINTESTINAL ENDOSCOPY      Current Outpatient Medications  Medication Sig Dispense Refill   Ascorbic Acid (VITAMIN C PO) Take by mouth.     atorvastatin (LIPITOR) 10 MG tablet TAKE 1 TAKE BY MOUTH EVERY 3 DAYS 90 tablet 1   beta carotene w/minerals (OCUVITE) tablet Take 1 tablet by mouth daily.     Calcium Citrate (CITRACAL PO) Take 2 tablets by mouth daily.     cetirizine (ZYRTEC) 10 MG tablet Take 1 tablet (10 mg total) by mouth daily. 30 tablet 11   clobetasol ointment (TEMOVATE) 0.05 % Apply topically 2 (two) times daily as needed. Use for 2 weeks at time as needed.  Use at bedtime twice a week as needed for a maintenance dose. 30 g 1   Coenzyme Q10 (COQ10) 200 MG CAPS Take 1 tablet by mouth daily.     EPIPEN 2-PAK 0.3 MG/0.3ML SOAJ injection Inject 1 mg into the muscle as needed. Reported on 06/28/2016     Ergocalciferol (VITAMIN D2) 400 units TABS Take 1 capsule by mouth daily.     esomeprazole (NEXIUM) 40 MG capsule TAKE 1 CAPSULE(40 MG) BY MOUTH DAILY 90 capsule 1   fish oil-omega-3 fatty acids 1000 MG capsule Take 2 g by mouth daily.     fluticasone (FLONASE) 50 MCG/ACT nasal spray Place 2 sprays into both nostrils daily.     lactobacillus acidophilus (BACID) TABS tablet Take 2 tablets by mouth 2 (two) times a week.     Multiple Vitamin (MULTIVITAMIN) capsule Take 1 capsule by mouth daily.     PROAIR HFA 108 (90 Base) MCG/ACT inhaler INL 2 PFS PO Q 4 TO 6 H PRN  0   valsartan (DIOVAN) 80 MG tablet TAKE 1 TABLET(80 MG) BY MOUTH DAILY 90 tablet 0   sulfamethoxazole-trimethoprim (BACTRIM DS) 800-160 MG tablet Take 1 tablet by mouth 2 (two) times daily. (Patient not taking: Reported on 08/13/2021) 6 tablet 0   No current facility-administered medications for this visit.     ALLERGIES:  Evista [raloxifene], Amoxicillin-pot clavulanate, Fosamax [alendronate], Other, Shellfish allergy, and Singulair [montelukast]  Family History  Problem Relation Age of Onset   Hypertension Mother    Diabetes Father    Hypertension Father    Heart disease Father    Diabetes Sister    Hypertension Sister    Schizophrenia Sister    Thyroid disease Sister    Heart disease Sister    Throat cancer Paternal Uncle        smoker   Lung cancer Cousin        smoker   Colon cancer Neg Hx    Pancreatic cancer Neg Hx    Stomach cancer Neg Hx    Liver disease Neg Hx    Colon polyps Neg Hx  Esophageal cancer Neg Hx    Rectal cancer Neg Hx     Social History   Socioeconomic History   Marital status: Married    Spouse name: Not on file   Number of children: 1   Years of education: Not on file   Highest education level: Not on file  Occupational History   Occupation: retired  Tobacco Use   Smoking status: Never   Smokeless tobacco: Never  Vaping Use   Vaping Use: Never used  Substance and Sexual Activity   Alcohol use: Yes    Alcohol/week: 2.0 standard drinks    Types: 2 Glasses of wine per week   Drug use: No   Sexual activity: Yes    Partners: Male    Birth control/protection: Surgical    Comment: TVH--Still has ovaries  Other Topics Concern   Not on file  Social History Narrative   ** Merged History Encounter **       Social Determinants of Health   Financial Resource Strain: Low Risk    Difficulty of Paying Living Expenses: Not hard at all  Food Insecurity: No Food Insecurity   Worried About Charity fundraiser in the Last Year: Never true   Spring in the Last Year: Never true  Transportation Needs: No Transportation Needs   Lack of Transportation (Medical): No   Lack of Transportation (Non-Medical): No  Physical Activity: Insufficiently Active   Days of Exercise per Week: 3 days   Minutes of Exercise per Session: 30 min  Stress: No Stress Concern  Present   Feeling of Stress : Not at all  Social Connections: Socially Integrated   Frequency of Communication with Friends and Family: More than three times a week   Frequency of Social Gatherings with Friends and Family: More than three times a week   Attends Religious Services: More than 4 times per year   Active Member of [employer] or Organizations: Yes   Attends Music therapist: More than 4 times per year   Marital Status: Married  Human resources officer Violence: Not At Risk   Fear of Current or Ex-Partner: No   Emotionally Abused: No   Physically Abused: No   Sexually Abused: No    Review of Systems  Genitourinary:  Positive for pelvic pain (LLQ).  All other systems reviewed and are negative.  PHYSICAL EXAMINATION:    BP 138/76   Pulse 73   Ht '5\' 4"'$  (1.626 m)   Wt 146 lb (66.2 kg)   LMP 12/16/1993 (Approximate)   SpO2 96%   BMI 25.06 kg/m     General appearance: alert, cooperative and appears stated age   Abdomen: soft, tender left lower quadrant, no guarding or rebound no masses,  no organomegaly    Pelvic: External genitalia:  no lesions              Urethra:  normal appearing urethra with no masses, tenderness or lesions              Bartholins and Skenes: normal                 Vagina: normal appearing vagina with normal color and discharge, no lesions              Cervix:absent                Bimanual Exam:  Uterus:  normal size, contour, position, consistency, mobility, non-tender  Adnexa: no mass, fullness, tenderness              Rectal exam: yes.  Confirms.              Anus:  normal sphincter tone, no lesions  Chaperone was present for exam: Estill Bamberg, CMA.  ASSESSMENT   LLQ pain.  Uncertain etiology.  Pelvic pain.  History of hematuria. Status post TAH.  Ovaries remain.  Status post abdominosacrocolpopexy, Halban culdoplasty and Burch procedure.  Status post appendectomy and diagnosis of small area of exposed retroperitoneal mesh.   Hiatal hernia.   PLAN  CT scan reviewed. Urinalysis:  normal.  We discussed her sacrocolpopexy procedure and that the mesh goes along the right pelvic sidewall.  She will return for a pelvic US to check her ovaries.  She will follow up with GI.    An After Visit Summary was printed and given to the patient.  31 min total time was spent for this patient encounter, including preparation, face-to-face counseling with the patient, coordination of care, and documentation of the encounter.

## 2021-08-26 ENCOUNTER — Ambulatory Visit
Admission: EM | Admit: 2021-08-26 | Discharge: 2021-08-26 | Disposition: A | Discharge status: Home / Self Care | Payer: Medicare Other | Attending: Family Medicine | Admitting: Family Medicine

## 2021-08-26 ENCOUNTER — Encounter: Payer: Self-pay | Admitting: Emergency Medicine

## 2021-08-26 DIAGNOSIS — U071 COVID-19: Secondary | ICD-10-CM | POA: Diagnosis not present

## 2021-08-26 MED ORDER — NIRMATRELVIR/RITONAVIR (PAXLOVID)TABLET: 3.0000 | ORAL_TABLET | Freq: Two times a day (BID) | ORAL | 0 refills | Status: AC

## 2021-08-26 MED ORDER — BENZONATATE 100 MG PO CAPS: 200.0000 mg | ORAL_CAPSULE | Freq: Three times a day (TID) | ORAL | 0 refills | Status: DC | PRN

## 2021-08-26 NOTE — ED Triage Notes (Addendum)
Chills, body aches and sinus congestion last night.  Positive at home covid test. Interested in anti virals.

## 2021-08-26 NOTE — Discharge Instructions (Addendum)
Home quarantine x5 days in accordance with current CDC guidelines. Take antiviral therapy as prescribed.  Antiviral does not cure COVID-19 however reduces the viral load in order to facilitate quick recovery from the virus. Alternate Tylenol and ibuprofen as needed for body aches and fever.  Symptom antihistamines, anticough medication, as needed for nasal symptoms.  If any breathing difficulty or chest pain develops go immediately to the closest emergency department for evaluation.

## 2021-08-26 NOTE — ED Provider Notes (Signed)
[facility]    CSN: [id number] Arrival date & time: 08/26/21  [id number]      History   Chief Complaint No chief complaint on file.   HPI Sarah [name] is a 75 y.o. female.   HPI Patient presents today following a positive home COVID test seeking antiviral therapy.  She reports that her husband is COVID-positive and is on day 4 of his COVID diagnosis and was treated with antivirals and she wishes to have treatment with the same.  Her symptoms began last night and she subsequently tested positive last night.  Her main symptoms are chills, body aches and sinus congestion.  Denies any shortness of breath or chest pain.   Past Medical History:  Diagnosis Date   Allergy    Arthritis    Asthma    Cancer ([place])    basal cell cancer--nose   Cataract    left eye and stable    Cervical disc disease    bulging   Diverticulitis    Diverticulosis    GERD (gastroesophageal reflux disease)    Hyperlipidemia    Hypertension    Osteopenia    Osteoporosis    PONV (postoperative nausea and vomiting)    Status post dilation of esophageal narrowing     Patient Active Problem List   Diagnosis Date Noted   Pain in left knee 03/06/2021   Allergic rhinitis 02/20/2021   Allergic rhinitis due to animal (cat) (dog) hair and dander 02/20/2021   Allergic rhinitis due to pollen 02/20/2021   Bee allergy status 02/20/2021   Gastro-esophageal reflux disease without esophagitis 02/20/2021   Rash and other nonspecific skin eruption 02/20/2021   Seafood allergy 02/20/2021   Toxic effect of venom of bees, accidental (unintentional), subsequent encounter 02/20/2021   Pain in left leg 10/11/2020   Overweight (BMI 25.0-29.9) 05/25/2020   Diverticulitis 07/09/2017   Primary osteoarthritis of both hands 12/23/2016   Primary osteoarthritis of both knees 12/23/2016   Postural kyphosis of thoracic region 12/23/2016   DJD (degenerative joint disease), cervical 12/23/2016   History of  gastroesophageal reflux (GERD) 12/23/2016   History of fracture of left hip 12/23/2016   Physical exam 11/04/2016   Environmental and seasonal allergies 06/27/2016   Esophageal dysphagia 06/03/2016   Osteoporosis 08/03/2014   Hyperlipidemia 11/06/2013   Hypertension 11/06/2013    Past Surgical History:  Procedure Laterality Date   ABDOMINAL HYSTERECTOMY  1995   TVH--still has ovaries   ABDOMINAL SACROCOLPOPEXY  2007   Halban culdoplasty & Burch procedure--Dr. Quincy Simmonds   APPENDECTOMY     Dr. Hassell Done   arthroscopic shoulder Left    Dr. Durward Fortes, bone spur   BLADDER SUSPENSION     CERVICAL FUSION     C-4, C-5 fusion   CHOLECYSTECTOMY     Dr. Georgina Quint   ESOPHAGEAL DILATION     HIP ARTHROPLASTY Left 11/07/2013   Procedure: ARTHROPLASTY BIPOLAR HIP;  Surgeon: Garald Balding, MD;  Location: [place];  Service: Orthopedics;  Laterality: Left;   KNEE ARTHROSCOPY Bilateral    Dr. Durward Fortes   OTHER SURGICAL HISTORY  2011   posterior Colporrhaphy with Xenform biological graft--Dr. Allyn Kenner REPAIR  2011   posterior colporrhaphy with Xenform biological graft--Dr. Quincy Simmonds   UPPER GASTROINTESTINAL ENDOSCOPY      OB History     Gravida  2   Para  1   Term  1   Preterm  0   AB  1   Living  SAB  0   IAB  0   Ectopic  0   Multiple      Live Births               Home Medications    Prior to Admission medications   Medication Sig Start Date End Date Taking? Authorizing Provider  Ascorbic Acid (VITAMIN C PO) Take by mouth.    [provider]  atorvastatin (LIPITOR) 10 MG tablet TAKE 1 TAKE BY MOUTH EVERY 3 DAYS 01/04/21   Midge Minium, MD  beta carotene w/minerals (OCUVITE) tablet Take 1 tablet by mouth daily.    [provider]  Calcium Citrate (CITRACAL PO) Take 2 tablets by mouth daily.    [provider]  cetirizine (ZYRTEC) 10 MG tablet Take 1 tablet (10 mg total) by mouth daily. 11/04/16   Midge Minium, MD  clobetasol ointment (TEMOVATE) 0.05 % Apply topically 2 (two) times daily as needed. Use for 2 weeks at time as needed.  Use at bedtime twice a week as needed for a maintenance dose. 05/03/21   Nunzio Cobbs, MD  Coenzyme Q10 (COQ10) 200 MG CAPS Take 1 tablet by mouth daily.    [provider]  EPIPEN 2-PAK 0.3 MG/0.3ML SOAJ injection Inject 1 mg into the muscle as needed. Reported on 06/28/2016 06/29/13   [provider]  Ergocalciferol (VITAMIN D2) 400 units TABS Take 1 capsule by mouth daily.    [provider]  esomeprazole (NEXIUM) 40 MG capsule TAKE 1 CAPSULE(40 MG) BY MOUTH DAILY 07/02/21   Midge Minium, MD  fish oil-omega-3 fatty acids 1000 MG capsule Take 2 g by mouth daily.    [provider]  fluticasone (FLONASE) 50 MCG/ACT nasal spray Place 2 sprays into both nostrils daily.    [provider]  lactobacillus acidophilus (BACID) TABS tablet Take 2 tablets by mouth 2 (two) times a week.    [provider]  Multiple Vitamin (MULTIVITAMIN) capsule Take 1 capsule by mouth daily.    [provider]  PROAIR HFA 108 (90 Base) MCG/ACT inhaler INL 2 PFS PO Q 4 TO 6 H PRN 04/18/17   [provider]  sulfamethoxazole-trimethoprim (BACTRIM DS) 800-160 MG tablet Take 1 tablet by mouth 2 (two) times daily. Patient not taking: Reported on 08/13/2021 08/02/21   Maximiano Coss, NP  valsartan (DIOVAN) 80 MG tablet TAKE 1 TABLET(80 MG) BY MOUTH DAILY 06/13/21   Midge Minium, MD    Family History Family History  Problem Relation Age of Onset   Hypertension Mother    Diabetes Father    Hypertension Father    Heart disease Father    Diabetes Sister    Hypertension Sister    Schizophrenia Sister    Thyroid disease Sister    Heart disease Sister    Throat cancer Paternal Uncle        smoker   Lung cancer Cousin        smoker   Colon cancer Neg Hx    Pancreatic cancer Neg Hx    Stomach cancer Neg Hx     Liver disease Neg Hx    Colon polyps Neg Hx    Esophageal cancer Neg Hx    Rectal cancer Neg Hx     Social History Social History   Tobacco Use   Smoking status: Never   Smokeless tobacco: Never  Vaping Use   Vaping Use: Never used  Substance Use Topics  Alcohol use: Yes    Alcohol/week: 2.0 standard drinks    Types: 2 Glasses of wine per week   Drug use: No     Allergies   Evista [raloxifene], Amoxicillin-pot clavulanate, Fosamax [alendronate], Other, Shellfish allergy, and Singulair [montelukast]   Review of Systems Review of Systems Pertinent negatives listed in HPI   Physical Exam Triage Vital Signs ED Triage Vitals [08/26/21 0829]  Enc Vitals Group     BP (!) 168/84     Pulse Rate 86     Resp 18     Temp 99.6 F (37.6 C)     Temp Source Oral     SpO2 94 %     Weight      Height      Head Circumference      Peak Flow      Pain Score 0     Pain Loc      Pain Edu?      Excl. in [place]?    No data found.  Updated Vital Signs BP (!) 168/84 (BP Location: Right Arm)   Pulse 86   Temp 99.6 F (37.6 C) (Oral)   Resp 18   LMP 12/16/1993 (Approximate)   SpO2 94%   Visual Acuity Right Eye Distance:   Left Eye Distance:   Bilateral Distance:    Right Eye Near:   Left Eye Near:    Bilateral Near:     Physical Exam  General Appearance:    Alert, cooperative, no distress  HENT:   Normocephalic, ears normal, nares mucosal edema with congestion, rhinorrhea, oropharynx patent without exudate or erythema  Eyes:    PERRL, conjunctiva/corneas clear, EOM's intact       Lungs:     Clear to auscultation bilaterally, respirations unlabored  Heart:    Regular rate and rhythm  Neurologic:   Awake, alert, oriented x 3. No apparent focal neurological           defect.      UC Treatments / Results  Labs (all labs ordered are listed, but only abnormal results are displayed) Labs Reviewed - No data to display  EKG   Radiology No results  found.  Procedures Procedures (including critical care time)  Medications Ordered in UC Medications - No data to display  Initial Impression / Assessment and Plan / UC Course  I have reviewed the triage vital signs and the nursing notes.  Pertinent labs & imaging results that were available during my care of the patient were reviewed by me and considered in my medical decision making (see chart for details).    Positive home COVID test Antiviral therapy initiated with Paxlovid ER precautions given. Continue symptomatic management. Return as needed. Final Clinical Impressions(s) / UC Diagnoses   Final diagnoses:  COVID-19 virus infection     Discharge Instructions      Home quarantine x5 days in accordance with current CDC guidelines. Take antiviral therapy as prescribed.  Antiviral does not cure COVID-19 however reduces the viral load in order to facilitate quick recovery from the virus. Alternate Tylenol and ibuprofen as needed for body aches and fever.  Symptom antihistamines, anticough medication, as needed for nasal symptoms.  If any breathing difficulty or chest pain develops go immediately to the closest emergency department for evaluation.    ED Prescriptions   None    PDMP not reviewed this encounter.   Scot Jun, FNP 08/26/21 1324

## 2021-09-04 NOTE — Progress Notes (Signed)
09/04/2021 JOHNAY MANO [id number] 10/29/1946   CHIEF COMPLAINT: Lower left abdominal pain   HISTORY OF PRESENT ILLNESS:  Arneshia B. Kroboth is a 75 year old female with a past medical history of arthritis, osteoporosis, hypertension, hyperlipidemia, asthma, GERD, large hiatal hernia per CT, diverticulitis and colon polyps. Covid 19 Sept 2022.  Past appendectomy, partial hysterectomy and cholecystectomy.  She presents to our office today as referred by Maximiano Coss NP for further evaluation regarding LLQ abdominal pain. She reported having LLQ abdominal pain which started approximately one year ago. Her LLQ pain comes and goes but occurs daily and is unrelated to eating or change of position but sometimes improves after passing a BM. Her LLQ pain sometimes interferes with her sleep. No fevers, sweats or chills. No weight loss. She is passing a normal brown BM daily. She does not feel constipated. No abdominal bloat. No rectal bleeding or black stools. Her stress level has been elevated since her husband was diagnosed with prostate cancer 01/2021. CTAP 08/07/2021 showed scattered diverticulosis without diverticulitis, no discrete areas of bowel wall thickening, however the sigmoid colon image was somewhat degraded secondary to streak artifact patient's left total hip prosthesis.  She saw her gynecologist recently without any obvious pelvic abnormality. She is scheduled for a pelvic sonogram within the next 2 weeks.  Her most recent colonoscopy by Dr. Ardis Hughs was 09/2017 and a 4 mm tubular adenomatous polyp was removed from the colon and she was advised to repeat a colonoscopy 09/2022.  She denies having any dysphagia or GERD symptoms.  CTAP 08/07/2021 identified a large hiatal hernia.  No fever, sweats or chills.  No weight loss.   CBC Latest Ref Rng & Units 07/10/2021 08/17/2020 05/25/2020  WBC 4.0 - 10.5 K/uL 4.4 7.5 3.4(L)  Hemoglobin 12.0 - 15.0 g/dL 13.5 13.3 13.2  Hematocrit 36.0 - 46.0 % 39.8 39.4  38.3  Platelets 150.0 - 400.0 K/uL 208.0 234.0 188.0    CMP Latest Ref Rng & Units 07/10/2021 08/17/2020 05/25/2020  Glucose 70 - 99 mg/dL 96 78 77  BUN 6 - 23 mg/dL 13 12 19   Creatinine 0.40 - 1.20 mg/dL 0.92 0.89 0.92  Sodium 135 - 145 mEq/L 140 140 141  Potassium 3.5 - 5.1 mEq/L 3.7 4.1 4.4  Chloride 96 - 112 mEq/L 104 101 104  CO2 19 - 32 mEq/L 28 29 29   Calcium 8.4 - 10.5 mg/dL 9.6 9.4 9.7  Total Protein 6.0 - 8.3 g/dL 7.0 6.9 6.9  Total Bilirubin 0.2 - 1.2 mg/dL 0.4 0.4 0.5  Alkaline Phos 39 - 117 U/L 60 63 60  AST 0 - 37 U/L 19 14 21   ALT 0 - 35 U/L 15 12 18      CTAP with contrast 08/07/2021: FINDINGS: Lower chest: Limited visualization of the lower thorax demonstrates a large hiatal hernia. Minimal right basilar subsegmental atelectasis. No focal airspace opacities. No pleural effusion.   Borderline cardiomegaly.  No pericardial effusion.   Hepatobiliary: Normal hepatic contour. Scattered subcentimeter hypoattenuating hepatic lesions are too small to accurately characterize though appears similar to the 08/2020 examination and favored to represent hepatic cysts. Post cholecystectomy. There is mild prominence of the CBD presumably secondary to biliary reservoir phenomena in the setting of post cholecystectomy state. No intrahepatic biliary ductal dilatation. No ascites.   Pancreas: Punctate subcentimeter hypoattenuating lesion within the posterior aspect of the mid body of the pancreas (axial image 19, series 2; coronal image 35, series 5), is unchanged compared to  the 01/2009 examination and thus of benign etiology. No associated pancreatic ductal dilatation or downstream pancreatic atrophy.   Spleen: Normal appearance of the spleen.   Adrenals/Urinary Tract: There is symmetric enhancement and excretion of the bilateral kidneys. Bilateral subcentimeter hypoattenuating renal lesions are too small to accurately characterize though similar to the 08/2020 examination and  favored to represent renal cysts. No evidence of nephrolithiasis on this postcontrast examination. Note is again made of small bilateral extrarenal pelvis sees. No urinary obstruction or perinephric stranding.   Normal appearance of the bilateral adrenal glands. Normal appearance of the urinary bladder given degree of distention, though note, evaluation degraded secondary streak artifact from the patient's left total hip prosthesis.   Stomach/Bowel: Large hiatal hernia, similar to the 08/2020 examination, though new compared to remote examination performed in 2010.   Enteric contrast extends to the level of the rectum. Scattered colonic diverticulosis without evidence of superimposed acute diverticulitis. Normal appearance of the terminal ileum. The appendix is not visualized compatible with provided operative history.   No discrete areas of bowel wall thickening, though note, evaluation of the sigmoid colon is somewhat degraded secondary to streak artifact patient's left total hip prosthesis. No evidence of enteric obstruction. No pneumoperitoneum, pneumatosis or portal venous gas.   Vascular/Lymphatic: Moderate amount of scattered predominantly calcified atherosclerotic plaque within a mildly tortuous but normal caliber abdominal aorta, not resulting in hemodynamically significant stenosis. The major branch vessels of the abdominal aorta appear patent on this non CTA examination.   No bulky retroperitoneal, mesenteric, pelvic or inguinal lymphadenopathy.   Reproductive: Post hysterectomy. No discrete adnexal lesions, though note, evaluation degraded secondary streak artifact from the patient's left total hip prosthesis.   Other: Tiny mesenteric fat containing periumbilical hernia.   Musculoskeletal: No acute or aggressive osseous abnormalities. Mild scoliotic curvature of the lumbar spine with moderate multilevel lumbar spine DDD. Bilateral facet degenerative change the  lower lumbar spine. Post left total hip prosthesis, incompletely evaluated though imaged components are without evidence of hardware failure or loosening.   IMPRESSION: 1. Large hiatal hernia, similar to the 08/2020 examination though new compared to remote abdominal CT performed in 2010. 2. Diverticulosis without evidence of superimposed acute diverticulitis. 3. Otherwise, no explanation for patient's left lower quadrant abdominal pain. Specifically, no evidence of enteric or urinary obstruction. 4.  Aortic Atherosclerosis   Colonoscopy 10/08/2017 by Dr. Ardis Hughs: - One 4 mm polyp in the transverse colon, removed with a cold snare. Resected and retrieved. - Diverticulosis in the left colon. - The examination was otherwise normal on direct and retroflexion views. - 5 year colonoscopy recall Surgical [P], transverse, polyp - TUBULAR ADENOMA (3 OF 4 FRAGMENTS) - BENIGN COLONIC MUCOSA (1 OF 4 FRAGMENTS) - NO HIGH GRADE DYSPLASIA OR MALIGNANCY IDENTIFIED  EGD 06/28/2016: Benign-appearing esophageal stenosis. Dilated. - The examination was otherwise normal. - No specimens collected.    Past Medical History:  Diagnosis Date   Allergy    Arthritis    Asthma    Cancer ([place])    basal cell cancer--nose   Cataract    left eye and stable    Cervical disc disease    bulging   Diverticulitis    Diverticulosis    GERD (gastroesophageal reflux disease)    Hyperlipidemia    Hypertension    Osteopenia    Osteoporosis    PONV (postoperative nausea and vomiting)    Status post dilation of esophageal narrowing    Past Surgical History:  Procedure Laterality Date  ABDOMINAL HYSTERECTOMY  1995   TVH--still has ovaries   ABDOMINAL SACROCOLPOPEXY  2007   Halban culdoplasty & Burch procedure--Dr. Quincy Simmonds   APPENDECTOMY     Dr. Hassell Done   arthroscopic shoulder Left    Dr. Durward Fortes, bone spur   BLADDER SUSPENSION     CERVICAL FUSION     C-4, C-5 fusion   CHOLECYSTECTOMY     Dr.  Georgina Quint   ESOPHAGEAL DILATION     HIP ARTHROPLASTY Left 11/07/2013   Procedure: ARTHROPLASTY BIPOLAR HIP;  Surgeon: Garald Balding, MD;  Location: [facility];  Service: Orthopedics;  Laterality: Left;   KNEE ARTHROSCOPY Bilateral    Dr. Durward Fortes   OTHER SURGICAL HISTORY  2011   posterior Colporrhaphy with Xenform biological graft--Dr. Allyn Kenner REPAIR  2011   posterior colporrhaphy with Xenform biological graft--Dr. Quincy Simmonds   UPPER GASTROINTESTINAL ENDOSCOPY    She reports having nausea and vomiting with sedation/anesthesia.  Social History: She is married.  She has 1 daughter.  She is retired.  Nonsmoker. She drinks one glass of wine daily.   Family History: Father deceased age 53 old age, heart disease and diabetes. Mother deceased 40 history of dementia.  Paternal uncle with history of throat cancer, he was a smoker.  Sister with thyroid disease.   Allergies  Allergen Reactions   Evista [Raloxifene]     Respiratory infections, shortness of breath(not anaphylaxis), leg heaviness.   Amoxicillin-Pot Clavulanate Hives and Other (See Comments)   Fosamax [Alendronate] Other (See Comments)    "Due to Esophagus problems"--narrow   Other Other (See Comments)   Shellfish Allergy Hives   Singulair [Montelukast] Other (See Comments)    unknown      Outpatient Encounter Medications as of 09/05/2021  Medication Sig   Ascorbic Acid (VITAMIN C PO) Take by mouth.   atorvastatin (LIPITOR) 10 MG tablet TAKE 1 TAKE BY MOUTH EVERY 3 DAYS   benzonatate (TESSALON) 100 MG capsule Take 2 capsules (200 mg total) by mouth 3 (three) times daily as needed for cough.   beta carotene w/minerals (OCUVITE) tablet Take 1 tablet by mouth daily.   Calcium Citrate (CITRACAL PO) Take 2 tablets by mouth daily.   cetirizine (ZYRTEC) 10 MG tablet Take 1 tablet (10 mg total) by mouth daily.   clobetasol ointment (TEMOVATE) 0.05 % Apply topically 2 (two) times daily as needed. Use for 2 weeks at time as  needed.  Use at bedtime twice a week as needed for a maintenance dose.   Coenzyme Q10 (COQ10) 200 MG CAPS Take 1 tablet by mouth daily.   EPIPEN 2-PAK 0.3 MG/0.3ML SOAJ injection Inject 1 mg into the muscle as needed. Reported on 06/28/2016   Ergocalciferol (VITAMIN D2) 400 units TABS Take 1 capsule by mouth daily.   esomeprazole (NEXIUM) 40 MG capsule TAKE 1 CAPSULE(40 MG) BY MOUTH DAILY   fish oil-omega-3 fatty acids 1000 MG capsule Take 2 g by mouth daily.   fluticasone (FLONASE) 50 MCG/ACT nasal spray Place 2 sprays into both nostrils daily.   lactobacillus acidophilus (BACID) TABS tablet Take 2 tablets by mouth 2 (two) times a week.   Multiple Vitamin (MULTIVITAMIN) capsule Take 1 capsule by mouth daily.   PROAIR HFA 108 (90 Base) MCG/ACT inhaler INL 2 PFS PO Q 4 TO 6 H PRN   sulfamethoxazole-trimethoprim (BACTRIM DS) 800-160 MG tablet Take 1 tablet by mouth 2 (two) times daily. (Patient not taking: Reported on 08/13/2021)   valsartan (DIOVAN) 80 MG tablet  TAKE 1 TABLET(80 MG) BY MOUTH DAILY   No facility-administered encounter medications on file as of 09/05/2021.    REVIEW OF SYSTEMS:  Gen: Denies fever, sweats or chills. No weight loss.  CV: Denies chest pain, palpitations or edema. Resp: Denies cough, shortness of breath of hemoptysis.  GI: See HPI. GU : Denies urinary burning, blood in urine, increased urinary frequency or incontinence. MS: + Back pain.  Derm: Denies rash, itchiness, skin lesions or unhealing ulcers. Psych: Denies depression, anxiety or memory loss.  Heme: Denies bruising, bleeding. Neuro:  Denies headaches, dizziness or paresthesias. Endo:  Denies any problems with DM, thyroid or adrenal function.  PHYSICAL EXAM: LMP 12/16/1993 (Approximate)  BP 110/70   Pulse 100   Ht 5\' 4"  (1.626 m)   Wt 144 lb (65.3 kg)   LMP 12/16/1993 (Approximate)   BMI 24.72 kg/m   General: 75 year old female in no acute distress. Head: Normocephalic and atraumatic. Eyes:   Sclerae non-icteric, conjunctive pink. Ears: Normal auditory acuity. Mouth: Dentition intact. No ulcers or lesions.  Neck: Supple, no lymphadenopathy or thyromegaly.  Lungs: Clear bilaterally to auscultation without wheezes, crackles or rhonchi. Heart: Regular rate and rhythm. No murmur, rub or gallop appreciated.  Abdomen: Soft, nontender, non distended. No masses. No hepatosplenomegaly. Normoactive bowel sounds x 4 quadrants.  Rectal: Deferred. Musculoskeletal: Symmetrical with no gross deformities. Skin: Warm and dry. No rash or lesions on visible extremities. Extremities: No edema. Neurological: Alert oriented x 4, no focal deficits.  Psychological:  Alert and cooperative. Normal mood and affect.  ASSESSMENT AND PLAN:  65) 75 year old female with a history of left colon diverticulosis with LLQ abdominal pain x 1 year. CTAP 08/07/2021 without evidence of diverticulitis. LLQ pain has not changed/worsened since CTAP done. Possible IBS vs adhesions from past abdominal surgeries.  -Proceed with pelvic sonogram with gyn  -IBgard 1 capsule p.o. 2-3 times daily as needed -CTAP without evidence of diverticulitis or colon mass which is reassuring, however, I discussed scheduling a diagnostic colonoscopy due to her persistent chronic LLQ pain.  -Colonoscopy benefits and risks discussed including risk with sedation, risk of bleeding, perforation and infection   2) History of colon polyps. Last colonoscopy 09/2017 identified a 4 mm tubular adenomatous polyp removed from the transverse colon. -See plan in # 1  3) GERD, large hiatal hernia per CT, asymptomatic   4) Subcentimeter lesion mid body of pancreas per CT, unchanged when compared to prior CT 01/2009, likely benign  5) Mild prominence of the CBD per consistent with past cholecystectomy per CTAP   CC:  Maximiano Coss, NP

## 2021-09-05 ENCOUNTER — Encounter: Payer: Self-pay | Admitting: Family Medicine

## 2021-09-05 ENCOUNTER — Other Ambulatory Visit: Payer: Self-pay

## 2021-09-05 ENCOUNTER — Encounter: Payer: Self-pay | Admitting: Nurse Practitioner

## 2021-09-05 ENCOUNTER — Ambulatory Visit (INDEPENDENT_AMBULATORY_CARE_PROVIDER_SITE_OTHER): Payer: Medicare Other | Admitting: Nurse Practitioner

## 2021-09-05 ENCOUNTER — Ambulatory Visit (INDEPENDENT_AMBULATORY_CARE_PROVIDER_SITE_OTHER): Payer: Medicare Other | Admitting: Family Medicine

## 2021-09-05 VITALS — BP 110/70 | HR 100 | Ht 64.0 in | Wt 144.0 lb

## 2021-09-05 VITALS — BP 132/70 | HR 86 | Temp 98.3°F | Resp 16 | Ht 64.0 in | Wt 144.0 lb

## 2021-09-05 DIAGNOSIS — Z86010 Personal history of colonic polyps: Secondary | ICD-10-CM | POA: Diagnosis not present

## 2021-09-05 DIAGNOSIS — R1032 Left lower quadrant pain: Secondary | ICD-10-CM

## 2021-09-05 DIAGNOSIS — R000 Tachycardia, unspecified: Secondary | ICD-10-CM | POA: Diagnosis not present

## 2021-09-05 DIAGNOSIS — R002 Palpitations: Secondary | ICD-10-CM | POA: Diagnosis not present

## 2021-09-05 MED ORDER — SUPREP BOWEL PREP KIT 17.5-3.13-1.6 GM/177ML PO SOLN: 1.0000 | ORAL | 0 refills | Status: DC

## 2021-09-05 NOTE — Progress Notes (Signed)
Subjective:  Patient ID: Sarah [name], female    DOB: Sep 15, 1946  Age: 75 y.o. MRN: [id number]  CC:  Chief Complaint  Patient presents with   Tachycardia    Pt reports seen this am at GI was at 100bpm  and has been upper 80s most of this morning, pt denies chest pains or tightness, notes possible skip in beats,     HPI [facility]art presents for   Tachycardia: Seen at GI this morning for some ongoing abdominal concerns. HR 100. Slight stressed at the time.  Caffeine: minimal in tea this morning, did take sudafed one this am.  No palpitations this am or currently, rare fluttering feeling few times in a month, resolves in second - extra beat.  No chest pain, lightheadedness/dizziness.  No fever.  Covid infection recently - day 14, has tested negative. No rebound symptoms.    History Patient Active Problem List   Diagnosis Date Noted   Pain in left knee 03/06/2021   Allergic rhinitis 02/20/2021   Allergic rhinitis due to animal (cat) (dog) hair and dander 02/20/2021   Allergic rhinitis due to pollen 02/20/2021   Bee allergy status 02/20/2021   Gastro-esophageal reflux disease without esophagitis 02/20/2021   Rash and other nonspecific skin eruption 02/20/2021   Seafood allergy 02/20/2021   Toxic effect of venom of bees, accidental (unintentional), subsequent encounter 02/20/2021   Pain in left leg 10/11/2020   Overweight (BMI 25.0-29.9) 05/25/2020   Diverticulitis 07/09/2017   Primary osteoarthritis of both hands 12/23/2016   Primary osteoarthritis of both knees 12/23/2016   Postural kyphosis of thoracic region 12/23/2016   DJD (degenerative joint disease), cervical 12/23/2016   History of gastroesophageal reflux (GERD) 12/23/2016   History of fracture of left hip 12/23/2016   Physical exam 11/04/2016   Environmental and seasonal allergies 06/27/2016   Esophageal dysphagia 06/03/2016   Osteoporosis 08/03/2014   Hyperlipidemia 11/06/2013   Hypertension 11/06/2013    Past Medical History:  Diagnosis Date   Allergy    Arthritis    Asthma    Cancer ([place])    basal cell cancer--nose   Cataract    left eye and stable    Cervical disc disease    bulging   Diverticulitis    Diverticulosis    GERD (gastroesophageal reflux disease)    Hyperlipidemia    Hypertension    Osteopenia    Osteoporosis    PONV (postoperative nausea and vomiting)    Status post dilation of esophageal narrowing    Past Surgical History:  Procedure Laterality Date   ABDOMINAL HYSTERECTOMY  1995   TVH--still has ovaries   ABDOMINAL SACROCOLPOPEXY  2007   Halban culdoplasty & Burch procedure--Dr. Quincy Simmonds   APPENDECTOMY     Dr. Hassell Done   arthroscopic shoulder Left    Dr. Durward Fortes, bone spur   BLADDER SUSPENSION     CERVICAL FUSION     C-4, C-5 fusion   CHOLECYSTECTOMY     Dr. Georgina Quint   ESOPHAGEAL DILATION     HIP ARTHROPLASTY Left 11/07/2013   Procedure: ARTHROPLASTY BIPOLAR HIP;  Surgeon: Garald Balding, MD;  Location: [facility];  Service: Orthopedics;  Laterality: Left;   KNEE ARTHROSCOPY Bilateral    Dr. Durward Fortes   OTHER SURGICAL HISTORY  2011   posterior Colporrhaphy with Xenform biological graft--Dr. Allyn Kenner REPAIR  2011   posterior colporrhaphy with Xenform biological graft--Dr. Quincy Simmonds   UPPER GASTROINTESTINAL ENDOSCOPY     Allergies  Allergen Reactions   Evista [Raloxifene]     Respiratory infections, shortness of breath(not anaphylaxis), leg heaviness.   Amoxicillin-Pot Clavulanate Hives and Other (See Comments)   Fosamax [Alendronate] Other (See Comments)    "Due to Esophagus problems"--narrow   Other Other (See Comments)   Shellfish Allergy Hives   Singulair [Montelukast] Other (See Comments)    unknown   Prior to Admission medications   Medication Sig Start Date End Date Taking? Authorizing Provider  Ascorbic Acid (VITAMIN C PO) Take by mouth.   Yes [provider]  atorvastatin (LIPITOR) 10 MG tablet TAKE 1 TAKE BY  MOUTH EVERY 3 DAYS 01/04/21  Yes Midge Minium, MD  beta carotene w/minerals (OCUVITE) tablet Take 1 tablet by mouth daily.   Yes [provider]  Calcium Citrate (CITRACAL PO) Take 2 tablets by mouth daily.   Yes [provider]  cetirizine (ZYRTEC) 10 MG tablet Take 1 tablet (10 mg total) by mouth daily. 11/04/16  Yes Midge Minium, MD  clobetasol ointment (TEMOVATE) 0.05 % Apply topically 2 (two) times daily as needed. Use for 2 weeks at time as needed.  Use at bedtime twice a week as needed for a maintenance dose. 05/03/21  Yes Amundson Raliegh Ip, MD  Coenzyme Q10 (COQ10) 200 MG CAPS Take 1 tablet by mouth daily.   Yes [provider]  EPIPEN 2-PAK 0.3 MG/0.3ML SOAJ injection Inject 1 mg into the muscle as needed. Reported on 06/28/2016 06/29/13  Yes [provider]  Ergocalciferol (VITAMIN D2) 400 units TABS Take 1 capsule by mouth daily.   Yes [provider]  esomeprazole (NEXIUM) 40 MG capsule TAKE 1 CAPSULE(40 MG) BY MOUTH DAILY 07/02/21  Yes Midge Minium, MD  fish oil-omega-3 fatty acids 1000 MG capsule Take 2 g by mouth daily.   Yes [provider]  fluticasone (FLONASE) 50 MCG/ACT nasal spray Place 2 sprays into both nostrils daily.   Yes [provider]  lactobacillus acidophilus (BACID) TABS tablet Take 2 tablets by mouth 2 (two) times a week.   Yes [provider]  Multiple Vitamin (MULTIVITAMIN) capsule Take 1 capsule by mouth daily.   Yes [provider]  PROAIR HFA 108 (90 Base) MCG/ACT inhaler INL 2 PFS PO Q 4 TO 6 H PRN 04/18/17  Yes [provider]  sulfamethoxazole-trimethoprim (BACTRIM DS) 800-160 MG tablet Take 1 tablet by mouth 2 (two) times daily. 08/02/21  Yes Maximiano Coss, NP  SUPREP BOWEL PREP KIT 17.5-3.13-1.6 GM/177ML SOLN Take 1 kit by mouth as directed. For colonoscopy prep 09/05/21  Yes Noralyn Pick, NP  valsartan (DIOVAN) 80 MG tablet TAKE 1  TABLET(80 MG) BY MOUTH DAILY 06/13/21  Yes Midge Minium, MD   Social History   Socioeconomic History   Marital status: Married    Spouse name: Not on file   Number of children: 1   Years of education: Not on file   Highest education level: Not on file  Occupational History   Occupation: retired  Tobacco Use   Smoking status: Never   Smokeless tobacco: Never  Vaping Use   Vaping Use: Never used  Substance and Sexual Activity   Alcohol use: Yes    Alcohol/week: 2.0 standard drinks    Types: 2 Glasses of wine per week   Drug use: No   Sexual activity: Yes    Partners: Male    Birth control/protection: Surgical    Comment: TVH--Still has ovaries  Other  Topics Concern   Not on file  Social History Narrative   ** Merged History Encounter **       Social Determinants of Health   Financial Resource Strain: Low Risk    Difficulty of Paying Living Expenses: Not hard at all  Food Insecurity: No Food Insecurity   Worried About Charity fundraiser in the Last Year: Never true   Arboriculturist in the Last Year: Never true  Transportation Needs: No Transportation Needs   Lack of Transportation (Medical): No   Lack of Transportation (Non-Medical): No  Physical Activity: Insufficiently Active   Days of Exercise per Week: 3 days   Minutes of Exercise per Session: 30 min  Stress: No Stress Concern Present   Feeling of Stress : Not at all  Social Connections: Socially Integrated   Frequency of Communication with Friends and Family: More than three times a week   Frequency of Social Gatherings with Friends and Family: More than three times a week   Attends Religious Services: More than 4 times per year   Active Member of [employer] or Organizations: Yes   Attends Music therapist: More than 4 times per year   Marital Status: Married  Human resources officer Violence: Not At Risk   Fear of Current or Ex-Partner: No   Emotionally Abused: No   Physically Abused: No    Sexually Abused: No    Review of Systems   Objective:   Vitals:   09/05/21 1130  BP: 132/70  Pulse: 86  Resp: 16  Temp: 98.3 F (36.8 C)  TempSrc: Temporal  SpO2: 95%  Weight: 144 lb (65.3 kg)  Height: '5\' 4"'  (1.626 m)     Physical Exam Vitals reviewed.  Constitutional:      Appearance: Normal appearance. She is well-developed.  HENT:     Head: Normocephalic and atraumatic.  Eyes:     Conjunctiva/sclera: Conjunctivae normal.     Pupils: Pupils are equal, round, and reactive to light.  Neck:     Vascular: No carotid bruit.  Cardiovascular:     Rate and Rhythm: Normal rate and regular rhythm.     Heart sounds: Normal heart sounds.  Pulmonary:     Effort: Pulmonary effort is normal.     Breath sounds: Normal breath sounds.  Abdominal:     Palpations: Abdomen is soft. There is no pulsatile mass.     Tenderness: There is no abdominal tenderness.  Musculoskeletal:     Right lower leg: No edema.     Left lower leg: No edema.  Skin:    General: Skin is warm and dry.  Neurological:     Mental Status: She is alert and oriented to person, place, and time.  Psychiatric:        Mood and Affect: Mood normal.        Behavior: Behavior normal.     EKG, sinus rhythm, prominent R in V1 but nonspecific, also seen on previous EKG 02/18/2019 heart rate 80 versus 56 at that time.  No acute findings.  No significant ST or T wave changes when compared to previous EKG 02/18/2019  Assessment & Plan:  MARRY KUSCH is a 75 y.o. female . Palpitations - Plan: EKG 12-Lead Rare fleeting palpitations possible PVCs, not seen on EKG today.  Primary reason for visit today with tachycardia, better in office exam at gastroenterology, and otherwise asymptomatic.  Likely due to use of Sudafed.  Hold on that medication for now,  home monitoring for persistent or recurrent elevated heart rate and if that is the case we will check a CBC, TSH and possibly cardiology eval.  RTC precautions given if  recurrent palpitations or new/worsening symptoms  No orders of the defined types were placed in this encounter.  Patient Instructions  Heart rate sounds normal today.  EKG was reassuring and although heart rate was slightly faster than in March 2020, it overall appears normal.  I suspect that the Sudafed may have caused the elevated heart rate this morning.  Avoid that medication for now, monitor your heart rate at home and if it does start to run faster again, let me know and I will order some labs as well as decide on further work-up.  Take care.   Return to the clinic or go to the nearest emergency room if any of your symptoms worsen or new symptoms occur. Sinus Tachycardia Sinus tachycardia is a kind of fast heartbeat. In sinus tachycardia, the heart beats more than 100 times a minute. Sinus tachycardia starts in a part of the heart called the sinus node. Sinus tachycardia may be harmless, or it may be a sign of a serious condition. What are the causes? This condition may be caused by: Exercise or exertion. A fever. Pain. Loss of body fluids (dehydration). Severe bleeding (hemorrhage). Anxiety and stress. Certain substances, including: Alcohol. Caffeine. Tobacco and nicotine products. Cold medicines. Illegal drugs. Medical conditions including: Heart disease. An infection. An overactive thyroid (hyperthyroidism). A lack of red blood cells (anemia). What are the signs or symptoms? Symptoms of this condition include: A feeling that the heart is beating quickly (palpitations). Suddenly noticing your heartbeat (cardiac awareness). Dizziness. Tiredness (fatigue). Shortness of breath. Chest pain. Nausea. Fainting. How is this diagnosed? This condition is diagnosed with: A physical exam. Other tests, such as: Blood tests. An electrocardiogram (ECG). This test measures the electrical activity of the heart. Ambulatory cardiac monitor. This records your heartbeats for 24 hours  or more. You may be referred to a heart specialist (cardiologist). How is this treated? Treatment for this condition depends on the cause or the underlying condition. Treatment may involve: Treating the underlying condition. Taking new medicines or changing your current medicines as told by your health care provider. Making changes to your diet or lifestyle. Follow these instructions at home: Lifestyle  Do not use any products that contain nicotine or tobacco, such as cigarettes and e-cigarettes. If you need help quitting, ask your health care provider. Do not use illegal drugs, such as cocaine. Learn relaxation methods to help you when you get stressed or anxious. These include deep breathing. Avoid caffeine or other stimulants. Alcohol use  Do not drink alcohol if: Your health care provider tells you not to drink. You are pregnant, may be pregnant, or are planning to become pregnant. If you drink alcohol, limit how much you have: 0-1 drink a day for women. 0-2 drinks a day for men. Be aware of how much alcohol is in your drink. In the [place] one drink equals one typical bottle of beer (12 oz), one-half glass of wine (5 oz), or one shot of hard liquor (1 oz). General instructions Drink enough fluids to keep your urine pale yellow. Take over-the-counter and prescription medicines only as told by your health care provider. Keep all follow-up visits as told by your health care provider. This is important. Contact a health care provider if you have: A fever. Vomiting or diarrhea that does not go away. Get  help right away if you: Have pain in your chest, upper arms, jaw, or neck. Become weak or dizzy. Feel faint. Have palpitations that do not go away. Summary In sinus tachycardia, the heart beats more than 100 times a minute. Sinus tachycardia may be harmless, or it may be a sign of a serious condition. Treatment for this condition depends on the cause or the underlying  condition. Get help right away if you have pain in your chest, upper arms, jaw, or neck. This information is not intended to replace advice given to you by your health care provider. Make sure you discuss any questions you have with your health care provider. Document Revised: 01/21/2018 Document Reviewed: 01/21/2018 Elsevier Patient Education  2022 [place],   Merri Ray, MD [place] 09/05/21 12:47 PM

## 2021-09-05 NOTE — Patient Instructions (Signed)
Heart rate sounds normal today.  EKG was reassuring and although heart rate was slightly faster than in March 2020, it overall appears normal.  I suspect that the Sudafed may have caused the elevated heart rate this morning.  Avoid that medication for now, monitor your heart rate at home and if it does start to run faster again, let me know and I will order some labs as well as decide on further work-up.  Take care.   Return to the clinic or go to the nearest emergency room if any of your symptoms worsen or new symptoms occur. Sinus Tachycardia Sinus tachycardia is a kind of fast heartbeat. In sinus tachycardia, the heart beats more than 100 times a minute. Sinus tachycardia starts in a part of the heart called the sinus node. Sinus tachycardia may be harmless, or it may be a sign of a serious condition. What are the causes? This condition may be caused by: Exercise or exertion. A fever. Pain. Loss of body fluids (dehydration). Severe bleeding (hemorrhage). Anxiety and stress. Certain substances, including: Alcohol. Caffeine. Tobacco and nicotine products. Cold medicines. Illegal drugs. Medical conditions including: Heart disease. An infection. An overactive thyroid (hyperthyroidism). A lack of red blood cells (anemia). What are the signs or symptoms? Symptoms of this condition include: A feeling that the heart is beating quickly (palpitations). Suddenly noticing your heartbeat (cardiac awareness). Dizziness. Tiredness (fatigue). Shortness of breath. Chest pain. Nausea. Fainting. How is this diagnosed? This condition is diagnosed with: A physical exam. Other tests, such as: Blood tests. An electrocardiogram (ECG). This test measures the electrical activity of the heart. Ambulatory cardiac monitor. This records your heartbeats for 24 hours or more. You may be referred to a heart specialist (cardiologist). How is this treated? Treatment for this condition depends on the cause  or the underlying condition. Treatment may involve: Treating the underlying condition. Taking new medicines or changing your current medicines as told by your health care provider. Making changes to your diet or lifestyle. Follow these instructions at home: Lifestyle  Do not use any products that contain nicotine or tobacco, such as cigarettes and e-cigarettes. If you need help quitting, ask your health care provider. Do not use illegal drugs, such as cocaine. Learn relaxation methods to help you when you get stressed or anxious. These include deep breathing. Avoid caffeine or other stimulants. Alcohol use  Do not drink alcohol if: Your health care provider tells you not to drink. You are pregnant, may be pregnant, or are planning to become pregnant. If you drink alcohol, limit how much you have: 0-1 drink a day for women. 0-2 drinks a day for men. Be aware of how much alcohol is in your drink. In the [place] one drink equals one typical bottle of beer (12 oz), one-half glass of wine (5 oz), or one shot of hard liquor (1 oz). General instructions Drink enough fluids to keep your urine pale yellow. Take over-the-counter and prescription medicines only as told by your health care provider. Keep all follow-up visits as told by your health care provider. This is important. Contact a health care provider if you have: A fever. Vomiting or diarrhea that does not go away. Get help right away if you: Have pain in your chest, upper arms, jaw, or neck. Become weak or dizzy. Feel faint. Have palpitations that do not go away. Summary In sinus tachycardia, the heart beats more than 100 times a minute. Sinus tachycardia may be harmless, or it may be a sign  of a serious condition. Treatment for this condition depends on the cause or the underlying condition. Get help right away if you have pain in your chest, upper arms, jaw, or neck. This information is not intended to replace advice given to  you by your health care provider. Make sure you discuss any questions you have with your health care provider. Document Revised: 01/21/2018 Document Reviewed: 01/21/2018 Elsevier Patient Education  [place].

## 2021-09-05 NOTE — Patient Instructions (Addendum)
If you are age 75 or older, your body mass index should be between 23-30. Your Body mass index is 24.72 kg/m. If this is out of the aforementioned range listed, please consider follow up with your Primary Care Provider.  The [facility] providers would like to encourage you to use [facility] to communicate with providers for non-urgent requests or questions.  Due to long hold times on the telephone, sending your provider a message by [facility] may be faster and more efficient way to get a response. Please allow 48 business hours for a response.  Please remember that this is for non-urgent requests/questions.  OVER THE COUNTER MEDICATION Please purchase the following medications over the counter and take as directed:  IBgard 1 capsule 2-3 times a day as needed for abdominal pain.  PROCEDURES: You have been scheduled for a colonoscopy. Please follow the written instructions given to you at your visit today. Please pick up your prep supplies at the pharmacy within the next 1-3 days. If you use inhalers (even only as needed), please bring them with you on the day of your procedure.  Follow up with your primary care provider regarding elevated heart rate.  Please call our office if your symptoms worsen.  It was great seeing you today! Thank you for entrusting me with your care and choosing [facility].  Noralyn Pick, CRNP

## 2021-09-06 ENCOUNTER — Telehealth (INDEPENDENT_AMBULATORY_CARE_PROVIDER_SITE_OTHER): Payer: Medicare Other | Admitting: Family Medicine

## 2021-09-06 ENCOUNTER — Encounter: Payer: Self-pay | Admitting: Family Medicine

## 2021-09-06 VITALS — Temp 99.8°F

## 2021-09-06 DIAGNOSIS — U071 COVID-19: Secondary | ICD-10-CM

## 2021-09-06 NOTE — Progress Notes (Signed)
Virtual Visit via Video Note  I connected with Sarah [name] on 09/06/21 at 2:54 PM by a video enabled telemedicine application and verified that I am speaking with the correct person using two identifiers.  Patient location: home - by self. My location: office - [place].    I discussed the limitations, risks, security and privacy concerns of performing an evaluation and management service by telephone and the availability of in person appointments. I also discussed with the patient that there may be a patient responsible charge related to this service. The patient expressed understanding and agreed to proceed, consent obtained  Chief complaint:  Chief Complaint  Patient presents with   Covid Positive    Pt positive this morning, coughing pt took Paxlovid after testing positive on the 11th at that time, fatigued     History of Present Illness: Sarah [name] is a 75 y.o. female  Covid 33 infection: ER visit noted on September 11, symptoms started day prior.  Husband was COVID-positive and day 4 of his symptoms, he was treated with antivirals, she was also started on Paxlovid on September 11.  Alternating Tylenol and ibuprofen, symptomatic care with antihistamines, Tessalon Perles as needed for cough.  I saw her yesterday for tachycardia that had been noted at her gastroenterology visit.  Heart rate 86 in the office.  Thought to be possibly related to Sudafed that she had taken that morning.  EKG without concerning findings or significant changes.  She had tested negative on Monday the 19th for COVID, and was outside the 10-day window.   When she got home last night - had a fever of 99.8. She noticed a fever yesterday afternoon, repeat COVID test was positive this morning. Some nasal, head congestion. No fever today. No dyspnea, no chest pain. No confusion, eating/drinking normally. HR ok - highest 83.   Tx: none. Has stopped taking sudafed. Has some tessalon if needed.     Patient  Active Problem List   Diagnosis Date Noted   Pain in left knee 03/06/2021   Allergic rhinitis 02/20/2021   Allergic rhinitis due to animal (cat) (dog) hair and dander 02/20/2021   Allergic rhinitis due to pollen 02/20/2021   Bee allergy status 02/20/2021   Gastro-esophageal reflux disease without esophagitis 02/20/2021   Rash and other nonspecific skin eruption 02/20/2021   Seafood allergy 02/20/2021   Toxic effect of venom of bees, accidental (unintentional), subsequent encounter 02/20/2021   Pain in left leg 10/11/2020   Overweight (BMI 25.0-29.9) 05/25/2020   Diverticulitis 07/09/2017   Primary osteoarthritis of both hands 12/23/2016   Primary osteoarthritis of both knees 12/23/2016   Postural kyphosis of thoracic region 12/23/2016   DJD (degenerative joint disease), cervical 12/23/2016   History of gastroesophageal reflux (GERD) 12/23/2016   History of fracture of left hip 12/23/2016   Physical exam 11/04/2016   Environmental and seasonal allergies 06/27/2016   Esophageal dysphagia 06/03/2016   Osteoporosis 08/03/2014   Hyperlipidemia 11/06/2013   Hypertension 11/06/2013   Past Medical History:  Diagnosis Date   Allergy    Arthritis    Asthma    Cancer ([place])    basal cell cancer--nose   Cataract    left eye and stable    Cervical disc disease    bulging   Diverticulitis    Diverticulosis    GERD (gastroesophageal reflux disease)    Hyperlipidemia    Hypertension    Osteopenia    Osteoporosis    PONV (postoperative nausea and  vomiting)    Status post dilation of esophageal narrowing    Past Surgical History:  Procedure Laterality Date   ABDOMINAL HYSTERECTOMY  1995   TVH--still has ovaries   ABDOMINAL SACROCOLPOPEXY  2007   Halban culdoplasty & Burch procedure--Dr. Quincy Simmonds   APPENDECTOMY     Dr. Hassell Done   arthroscopic shoulder Left    Dr. Durward Fortes, bone spur   BLADDER SUSPENSION     CERVICAL FUSION     C-4, C-5 fusion   CHOLECYSTECTOMY     Dr. Georgina Quint   ESOPHAGEAL DILATION     HIP ARTHROPLASTY Left 11/07/2013   Procedure: ARTHROPLASTY BIPOLAR HIP;  Surgeon: Garald Balding, MD;  Location: [facility];  Service: Orthopedics;  Laterality: Left;   KNEE ARTHROSCOPY Bilateral    Dr. Durward Fortes   OTHER SURGICAL HISTORY  2011   posterior Colporrhaphy with Xenform biological graft--Dr. Allyn Kenner REPAIR  2011   posterior colporrhaphy with Xenform biological graft--Dr. Quincy Simmonds   UPPER GASTROINTESTINAL ENDOSCOPY     Allergies  Allergen Reactions   Evista [Raloxifene]     Respiratory infections, shortness of breath(not anaphylaxis), leg heaviness.   Amoxicillin-Pot Clavulanate Hives and Other (See Comments)   Fosamax [Alendronate] Other (See Comments)    "Due to Esophagus problems"--narrow   Other Other (See Comments)   Shellfish Allergy Hives   Singulair [Montelukast] Other (See Comments)    unknown   Prior to Admission medications   Medication Sig Start Date End Date Taking? Authorizing Provider  Ascorbic Acid (VITAMIN C PO) Take by mouth.   Yes [provider]  atorvastatin (LIPITOR) 10 MG tablet TAKE 1 TAKE BY MOUTH EVERY 3 DAYS 01/04/21  Yes Midge Minium, MD  beta carotene w/minerals (OCUVITE) tablet Take 1 tablet by mouth daily.   Yes [provider]  Calcium Citrate (CITRACAL PO) Take 2 tablets by mouth daily.   Yes [provider]  cetirizine (ZYRTEC) 10 MG tablet Take 1 tablet (10 mg total) by mouth daily. 11/04/16  Yes Midge Minium, MD  clobetasol ointment (TEMOVATE) 0.05 % Apply topically 2 (two) times daily as needed. Use for 2 weeks at time as needed.  Use at bedtime twice a week as needed for a maintenance dose. 05/03/21  Yes Amundson Raliegh Ip, MD  Coenzyme Q10 (COQ10) 200 MG CAPS Take 1 tablet by mouth daily.   Yes [provider]  EPIPEN 2-PAK 0.3 MG/0.3ML SOAJ injection Inject 1 mg into the muscle as needed. Reported on 06/28/2016 06/29/13  Yes [provider]  Ergocalciferol (VITAMIN D2) 400 units TABS Take 1 capsule by mouth daily.   Yes [provider]  esomeprazole (NEXIUM) 40 MG capsule TAKE 1 CAPSULE(40 MG) BY MOUTH DAILY 07/02/21  Yes Midge Minium, MD  fish oil-omega-3 fatty acids 1000 MG capsule Take 2 g by mouth daily.   Yes [provider]  fluticasone (FLONASE) 50 MCG/ACT nasal spray Place 2 sprays into both nostrils daily.   Yes [provider]  lactobacillus acidophilus (BACID) TABS tablet Take 2 tablets by mouth 2 (two) times a week.   Yes [provider]  Multiple Vitamin (MULTIVITAMIN) capsule Take 1 capsule by mouth daily.   Yes [provider]  PROAIR HFA 108 (90 Base) MCG/ACT inhaler INL 2 PFS PO Q 4 TO 6 H PRN 04/18/17  Yes [provider]  sulfamethoxazole-trimethoprim (BACTRIM DS) 800-160 MG tablet Take 1 tablet by mouth 2 (two) times daily. 08/02/21  Yes Maximiano Coss, NP  SUPREP BOWEL PREP KIT 17.5-3.13-1.6 GM/177ML SOLN Take 1 kit by mouth as directed. For colonoscopy prep 09/05/21  Yes Noralyn Pick, NP  valsartan (DIOVAN) 80 MG tablet TAKE 1 TABLET(80 MG) BY MOUTH DAILY 06/13/21  Yes Midge Minium, MD   Social History   Socioeconomic History   Marital status: Married    Spouse name: Not on file   Number of children: 1   Years of education: Not on file   Highest education level: Not on file  Occupational History   Occupation: retired  Tobacco Use   Smoking status: Never   Smokeless tobacco: Never  Vaping Use   Vaping Use: Never used  Substance and Sexual Activity   Alcohol use: Yes    Alcohol/week: 2.0 standard drinks    Types: 2 Glasses of wine per week   Drug use: No   Sexual activity: Yes    Partners: Male    Birth control/protection: Surgical    Comment: TVH--Still has ovaries  Other Topics Concern   Not on file  Social History Narrative   ** Merged History Encounter **       Social Determinants of Health    Financial Resource Strain: Low Risk    Difficulty of Paying Living Expenses: Not hard at all  Food Insecurity: No Food Insecurity   Worried About Charity fundraiser in the Last Year: Never true   [place] in the Last Year: Never true  Transportation Needs: No Transportation Needs   Lack of Transportation (Medical): No   Lack of Transportation (Non-Medical): No  Physical Activity: Insufficiently Active   Days of Exercise per Week: 3 days   Minutes of Exercise per Session: 30 min  Stress: No Stress Concern Present   Feeling of Stress : Not at all  Social Connections: Socially Integrated   Frequency of Communication with Friends and Family: More than three times a week   Frequency of Social Gatherings with Friends and Family: More than three times a week   Attends Religious Services: More than 4 times per year   Active Member of [employer] or Organizations: Yes   Attends Music therapist: More than 4 times per year   Marital Status: Married  Human resources officer Violence: Not At Risk   Fear of Current or Ex-Partner: No   Emotionally Abused: No   Physically Abused: No   Sexually Abused: No    Observations/Objective: Vitals:   09/06/21 1419  Temp: 99.8 F (37.7 C)   Nontoxic appearance on video.  Appropriate responses, coherent responses, no respiratory distress.  No appreciable cough during exam.  All questions were answered with understanding of plan expressed.   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  Mild symptoms at present.  Continued symptomatic care discussed with Ladona Ridgel if needed, has some at home.  Saline nasal spray, fluids, rest.  Restart of isolation and masking precautions for 10 days discussed.  ER precautions given.  Signed,   Merri Ray, MD [place] 09/06/21 3:13 PM

## 2021-09-07 NOTE — Progress Notes (Signed)
Colon cancelled, pt scheduled to see Dr. Ardis Hughs 11/14/21 at 1:50pm. Pt aware of appt.

## 2021-09-07 NOTE — Progress Notes (Signed)
She is actually not "due" for surveillance colonoscopy for 7 years from her 2018 colonoscopy based on the newest colon cancer, polyp guidelines.  That would be 2025.  I do not think she necessarily needs to have 1 for her current symptoms.  Instead please offer her return office visit with me in about 2 months to see how she is feeling on trial of probiotics, tincture of time.  Thanks

## 2021-09-13 ENCOUNTER — Ambulatory Visit (INDEPENDENT_AMBULATORY_CARE_PROVIDER_SITE_OTHER): Payer: Medicare Other | Admitting: Obstetrics and Gynecology

## 2021-09-13 ENCOUNTER — Other Ambulatory Visit: Payer: Self-pay

## 2021-09-13 ENCOUNTER — Ambulatory Visit (INDEPENDENT_AMBULATORY_CARE_PROVIDER_SITE_OTHER): Payer: Medicare Other

## 2021-09-13 ENCOUNTER — Encounter: Payer: Self-pay | Admitting: Obstetrics and Gynecology

## 2021-09-13 VITALS — BP 144/86 | HR 70 | Ht 64.0 in | Wt 146.0 lb

## 2021-09-13 DIAGNOSIS — R1032 Left lower quadrant pain: Secondary | ICD-10-CM

## 2021-09-13 NOTE — Progress Notes (Signed)
GYNECOLOGY  VISIT   HPI: 75 y.o.   Married  Caucasian  female   G2P1010 with Patient's last menstrual period was 12/16/1993 (approximate).   here for   pelvic ultrasound consult due to LLQ pain.  She had a hysterectomy in the past and her ovaries remain.   Has an appointment to see her GI.  GYNECOLOGIC HISTORY: Patient's last menstrual period was 12/16/1993 (approximate). Contraception: hysterectomy  Menopausal hormone therapy:  none Last mammogram:  03-09-21 density C/BIRADS 1 negative  Last pap smear:   2012 negative         OB History     Gravida  2   Para  1   Term  1   Preterm  0   AB  1   Living         SAB  0   IAB  0   Ectopic  0   Multiple      Live Births                 Patient Active Problem List   Diagnosis Date Noted   Pain in left knee 03/06/2021   Allergic rhinitis 02/20/2021   Allergic rhinitis due to animal (cat) (dog) hair and dander 02/20/2021   Allergic rhinitis due to pollen 02/20/2021   Bee allergy status 02/20/2021   Gastro-esophageal reflux disease without esophagitis 02/20/2021   Rash and other nonspecific skin eruption 02/20/2021   Seafood allergy 02/20/2021   Toxic effect of venom of bees, accidental (unintentional), subsequent encounter 02/20/2021   Pain in left leg 10/11/2020   Overweight (BMI 25.0-29.9) 05/25/2020   Diverticulitis 07/09/2017   Primary osteoarthritis of both hands 12/23/2016   Primary osteoarthritis of both knees 12/23/2016   Postural kyphosis of thoracic region 12/23/2016   DJD (degenerative joint disease), cervical 12/23/2016   History of gastroesophageal reflux (GERD) 12/23/2016   History of fracture of left hip 12/23/2016   Physical exam 11/04/2016   Environmental and seasonal allergies 06/27/2016   Esophageal dysphagia 06/03/2016   Osteoporosis 08/03/2014   Hyperlipidemia 11/06/2013   Hypertension 11/06/2013    Past Medical History:  Diagnosis Date   Allergy    Arthritis    Asthma     Cancer ([place])    basal cell cancer--nose   Cataract    left eye and stable    Cervical disc disease    bulging   Diverticulitis    Diverticulosis    GERD (gastroesophageal reflux disease)    Hyperlipidemia    Hypertension    Osteopenia    Osteoporosis    PONV (postoperative nausea and vomiting)    Status post dilation of esophageal narrowing     Past Surgical History:  Procedure Laterality Date   ABDOMINAL HYSTERECTOMY  1995   TVH--still has ovaries   ABDOMINAL SACROCOLPOPEXY  2007   Halban culdoplasty & Burch procedure--Dr. Quincy Simmonds   APPENDECTOMY     Dr. Hassell Done   arthroscopic shoulder Left    Dr. Durward Fortes, bone spur   BLADDER SUSPENSION     CERVICAL FUSION     C-4, C-5 fusion   CHOLECYSTECTOMY     Dr. Georgina Quint   ESOPHAGEAL DILATION     HIP ARTHROPLASTY Left 11/07/2013   Procedure: ARTHROPLASTY BIPOLAR HIP;  Surgeon: Garald Balding, MD;  Location: [facility];  Service: Orthopedics;  Laterality: Left;   KNEE ARTHROSCOPY Bilateral    Dr. Durward Fortes   OTHER SURGICAL HISTORY  2011   posterior Colporrhaphy with Xenform biological  graft--Dr. Quincy Simmonds   RECTOCELE REPAIR  2011   posterior colporrhaphy with Xenform biological graft--Dr. Quincy Simmonds   UPPER GASTROINTESTINAL ENDOSCOPY      Current Outpatient Medications  Medication Sig Dispense Refill   Ascorbic Acid (VITAMIN C PO) Take by mouth.     atorvastatin (LIPITOR) 10 MG tablet TAKE 1 TAKE BY MOUTH EVERY 3 DAYS 90 tablet 1   beta carotene w/minerals (OCUVITE) tablet Take 1 tablet by mouth daily.     Calcium Citrate (CITRACAL PO) Take 2 tablets by mouth daily.     cetirizine (ZYRTEC) 10 MG tablet Take 1 tablet (10 mg total) by mouth daily. 30 tablet 11   clobetasol ointment (TEMOVATE) 0.05 % Apply topically 2 (two) times daily as needed. Use for 2 weeks at time as needed.  Use at bedtime twice a week as needed for a maintenance dose. 30 g 1   Coenzyme Q10 (COQ10) 200 MG CAPS Take 1 tablet by mouth daily.     EPIPEN 2-PAK 0.3  MG/0.3ML SOAJ injection Inject 1 mg into the muscle as needed. Reported on 06/28/2016     Ergocalciferol (VITAMIN D2) 400 units TABS Take 1 capsule by mouth daily.     esomeprazole (NEXIUM) 40 MG capsule TAKE 1 CAPSULE(40 MG) BY MOUTH DAILY 90 capsule 1   fish oil-omega-3 fatty acids 1000 MG capsule Take 2 g by mouth daily.     fluticasone (FLONASE) 50 MCG/ACT nasal spray Place 2 sprays into both nostrils daily.     lactobacillus acidophilus (BACID) TABS tablet Take 2 tablets by mouth 2 (two) times a week.     Multiple Vitamin (MULTIVITAMIN) capsule Take 1 capsule by mouth daily.     PROAIR HFA 108 (90 Base) MCG/ACT inhaler INL 2 PFS PO Q 4 TO 6 H PRN  0   sulfamethoxazole-trimethoprim (BACTRIM DS) 800-160 MG tablet Take 1 tablet by mouth 2 (two) times daily. 6 tablet 0   SUPREP BOWEL PREP KIT 17.5-3.13-1.6 GM/177ML SOLN Take 1 kit by mouth as directed. For colonoscopy prep 354 mL 0   valsartan (DIOVAN) 80 MG tablet TAKE 1 TABLET(80 MG) BY MOUTH DAILY 90 tablet 0   No current facility-administered medications for this visit.     ALLERGIES: Evista [raloxifene], Amoxicillin-pot clavulanate, Fosamax [alendronate], Other, Shellfish allergy, and Singulair [montelukast]  Family History  Problem Relation Age of Onset   Hypertension Mother    Diabetes Father    Hypertension Father    Heart disease Father    Diabetes Sister    Hypertension Sister    Schizophrenia Sister    Thyroid disease Sister    Heart disease Sister    Throat cancer Paternal Uncle        smoker   Lung cancer Cousin        smoker   Colon cancer Neg Hx    Pancreatic cancer Neg Hx    Stomach cancer Neg Hx    Liver disease Neg Hx    Colon polyps Neg Hx    Esophageal cancer Neg Hx    Rectal cancer Neg Hx     Social History   Socioeconomic History   Marital status: Married    Spouse name: Not on file   Number of children: 1   Years of education: Not on file   Highest education level: Not on file  Occupational  History   Occupation: retired  Tobacco Use   Smoking status: Never   Smokeless tobacco: Never  Vaping Use   Vaping  Use: Never used  Substance and Sexual Activity   Alcohol use: Yes    Alcohol/week: 2.0 standard drinks    Types: 2 Glasses of wine per week   Drug use: No   Sexual activity: Yes    Partners: Male    Birth control/protection: Surgical    Comment: TVH--Still has ovaries  Other Topics Concern   Not on file  Social History Narrative   ** Merged History Encounter **       Social Determinants of Health   Financial Resource Strain: Low Risk    Difficulty of Paying Living Expenses: Not hard at all  Food Insecurity: No Food Insecurity   Worried About Charity fundraiser in the Last Year: Never true   [place] in the Last Year: Never true  Transportation Needs: No Transportation Needs   Lack of Transportation (Medical): No   Lack of Transportation (Non-Medical): No  Physical Activity: Insufficiently Active   Days of Exercise per Week: 3 days   Minutes of Exercise per Session: 30 min  Stress: No Stress Concern Present   Feeling of Stress : Not at all  Social Connections: Socially Integrated   Frequency of Communication with Friends and Family: More than three times a week   Frequency of Social Gatherings with Friends and Family: More than three times a week   Attends Religious Services: More than 4 times per year   Active Member of [employer] or Organizations: Yes   Attends Music therapist: More than 4 times per year   Marital Status: Married  Human resources officer Violence: Not At Risk   Fear of Current or Ex-Partner: No   Emotionally Abused: No   Physically Abused: No   Sexually Abused: No    Review of Systems  All other systems reviewed and are negative.  PHYSICAL EXAMINATION:    BP (!) 144/86 (BP Location: Right Arm, Patient Position: Sitting, Cuff Size: Normal)   Pulse 70   Ht _0  (1.626 m)   Wt 146 lb (66.2 kg)   LMP 12/16/1993  (Approximate)   BMI 25.06 kg/m     General appearance: alert, cooperative and appears stated age  Pelvic US -transvaginal and transabdominal Uterus absent.  Neither ovary identified.  No pelvic mass or free fluid.   ASSESSMENT  LLQ pain.  I doubt gynecologic origin.   PLAN  Pelvic US images and report reviewed.  Reassurance given.  She will see GI.  FU prn.    An After Visit Summary was printed and given to the patient.

## 2021-09-28 ENCOUNTER — Encounter: Payer: Self-pay | Admitting: Family Medicine

## 2021-09-28 ENCOUNTER — Other Ambulatory Visit: Payer: Self-pay

## 2021-09-28 ENCOUNTER — Ambulatory Visit (INDEPENDENT_AMBULATORY_CARE_PROVIDER_SITE_OTHER): Payer: Medicare Other | Admitting: Family Medicine

## 2021-09-28 DIAGNOSIS — Z23 Encounter for immunization: Secondary | ICD-10-CM | POA: Diagnosis not present

## 2021-10-02 ENCOUNTER — Encounter: Payer: Medicare Other | Admitting: Gastroenterology

## 2021-10-07 NOTE — Telephone Encounter (Signed)
This concern has been previously addressed by myself and/or another provider.  If they patient has ongoing concerns, they can contact me at their convenience.  Thank you,  Rich Bralyn Folkert, NP 

## 2021-10-07 NOTE — Telephone Encounter (Signed)
This concern has been previously addressed by myself and/or another provider.  If they patient has ongoing concerns, they can contact me at their convenience.  Thank you,  Rich Esabella Stockinger, NP 

## 2021-10-22 ENCOUNTER — Other Ambulatory Visit: Payer: Self-pay | Admitting: Family Medicine

## 2021-10-22 DIAGNOSIS — I10 Essential (primary) hypertension: Secondary | ICD-10-CM

## 2021-11-14 ENCOUNTER — Telehealth: Payer: Self-pay | Admitting: Gastroenterology

## 2021-11-14 ENCOUNTER — Encounter: Payer: Self-pay | Admitting: Gastroenterology

## 2021-11-14 ENCOUNTER — Ambulatory Visit (INDEPENDENT_AMBULATORY_CARE_PROVIDER_SITE_OTHER): Payer: Medicare Other | Admitting: Gastroenterology

## 2021-11-14 VITALS — BP 130/86 | HR 100 | Ht 64.0 in | Wt 139.0 lb

## 2021-11-14 DIAGNOSIS — R1032 Left lower quadrant pain: Secondary | ICD-10-CM

## 2021-11-14 MED ORDER — SUPREP BOWEL PREP KIT 17.5-3.13-1.6 GM/177ML PO SOLN: 1.0000 | ORAL | 0 refills | Status: DC

## 2021-11-14 NOTE — Telephone Encounter (Signed)
Patient aware that Suprep was sent to pharmacy as requested.  Patient agreed to plan and verbalized understanding.  No further questions.

## 2021-11-14 NOTE — Progress Notes (Signed)
Review of pertinent gastrointestinal problems: 1. Routine risk for colon cancer: Colonoscopy Dr. Velora Heckler for "routine screening" done 02/2006 found left sided diverticulosis, hemorrhoids.  No polyps. He recommended she have annual hemocult testing (?) and repeat colonoscopy in 8 years (?). Colonoscopy Dr. Velora Heckler 2001 for screening; no polyps were found, recommended repeat colonoscopy in 5-10 years.  Cologuard stool testing 2017 was negative, recall at 3 years.  Colonoscopy October 2018 a single subcentimeter tubular adenoma was removed. 2. "esopahgeal spasm" led to EGD Dr. Velora Heckler 07/2000: found "tight crico" and early stricture in esophagus, dilated with 55 maloney. EGD 06/2016 DR. Ardis Hughs found benign GE junction stricture, dilated with CRE balloon to 32m this helped. 3. Clinical diverticiltis; known osis on 2007 colonoscopy.     HPI: This is a very pleasant 75year old woman who is here with her husband today   She was here in our office about 2 months ago with left lower quadrant pains.  She [place]  [place] scan abdomen pelvis August 2022 showed scattered diverticulosis without diverticulitis and no obvious etiology of her pains.  This also showed a large hiatal hernia.  Blood work July 2022 showed normal complete metabolic profile and normal CBC  Transvaginal ultrasound September 2022 was essentially normal  She is still bothered by left lower quadrant discomforts.  These are daily.  They can sometimes be worse after eating and sometimes moving her bowels will help.  They can last for minutes to hours.  They sometimes happen when she turns on her left side.  She has intentionally lost 8 pounds in the past 6 months or so.  She really has not noticed any changes in her bowels recently and she does not struggle with constipation or straining to move her bowels.  ROS: complete GI ROS as described in HPI, all other review negative.  Constitutional:  No unintentional weight loss   Past Medical  History:  Diagnosis Date   Allergy    Arthritis    Asthma    Cancer ([place]    basal cell cancer--nose   Cataract    left eye and stable    Cervical disc disease    bulging   Diverticulitis    Diverticulosis    GERD (gastroesophageal reflux disease)    Hyperlipidemia    Hypertension    Osteopenia    Osteoporosis    PONV (postoperative nausea and vomiting)    Status post dilation of esophageal narrowing     Past Surgical History:  Procedure Laterality Date   ABDOMINAL HYSTERECTOMY  1995   TVH--still has ovaries   ABDOMINAL SACROCOLPOPEXY  2007   Halban culdoplasty & Burch procedure--Dr. SQuincy Simmonds  APPENDECTOMY     Dr. MHassell Done  arthroscopic shoulder Left    Dr. WDurward Fortes bone spur   BLADDER SUSPENSION     CERVICAL FUSION     C-4, C-5 fusion   CHOLECYSTECTOMY     Dr. WGeorgina Quint  ESOPHAGEAL DILATION     HIP ARTHROPLASTY Left 11/07/2013   Procedure: ARTHROPLASTY BIPOLAR HIP;  Surgeon: PGarald Balding MD;  [facility]  Service: Orthopedics;  Laterality: Left;   KNEE ARTHROSCOPY Bilateral    Dr. WDurward Fortes  OTHER SURGICAL HISTORY  2011   posterior Colporrhaphy with Xenform biological graft--Dr. SAllyn KennerREPAIR  2011   posterior colporrhaphy with Xenform biological graft--Dr. SQuincy Simmonds  UPPER GASTROINTESTINAL ENDOSCOPY      Current Outpatient Medications  Medication Sig Dispense Refill   Ascorbic Acid (  VITAMIN C PO) Take by mouth.     atorvastatin (LIPITOR) 10 MG tablet TAKE 1 TAKE BY MOUTH EVERY 3 DAYS 90 tablet 1   beta carotene w/minerals (OCUVITE) tablet Take 1 tablet by mouth daily.     Calcium Citrate (CITRACAL PO) Take 2 tablets by mouth daily.     cetirizine (ZYRTEC) 10 MG tablet Take 1 tablet (10 mg total) by mouth daily. 30 tablet 11   clobetasol ointment (TEMOVATE) 0.05 % Apply topically 2 (two) times daily as needed. Use for 2 weeks at time as needed.  Use at bedtime twice a week as needed for a maintenance dose. 30 g 1   Coenzyme Q10  (COQ10) 200 MG CAPS Take 1 tablet by mouth daily.     EPIPEN 2-PAK 0.3 MG/0.3ML SOAJ injection Inject 1 mg into the muscle as needed. Reported on 06/28/2016     Ergocalciferol (VITAMIN D2) 400 units TABS Take 1 capsule by mouth daily.     esomeprazole (NEXIUM) 40 MG capsule TAKE 1 CAPSULE(40 MG) BY MOUTH DAILY 90 capsule 1   fish oil-omega-3 fatty acids 1000 MG capsule Take 2 g by mouth daily.     fluticasone (FLONASE) 50 MCG/ACT nasal spray Place 2 sprays into both nostrils daily.     lactobacillus acidophilus (BACID) TABS tablet Take 2 tablets by mouth 2 (two) times a week.     Multiple Vitamin (MULTIVITAMIN) capsule Take 1 capsule by mouth daily.     PROAIR HFA 108 (90 Base) MCG/ACT inhaler INL 2 PFS PO Q 4 TO 6 H PRN  0   sulfamethoxazole-trimethoprim (BACTRIM DS) 800-160 MG tablet Take 1 tablet by mouth 2 (two) times daily. 6 tablet 0   SUPREP BOWEL PREP KIT 17.5-3.13-1.6 GM/177ML SOLN Take 1 kit by mouth as directed. For colonoscopy prep 354 mL 0   valsartan (DIOVAN) 80 MG tablet TAKE 1 TABLET(80 MG) BY MOUTH DAILY 90 tablet 0   No current facility-administered medications for this visit.    Allergies as of 11/14/2021 - Review Complete 11/14/2021  Allergen Reaction Noted   Evista [raloxifene]  10/21/2013   Amoxicillin-pot clavulanate Hives and Other (See Comments) 08/02/2013   Fosamax [alendronate] Other (See Comments) 09/13/2015   Other Other (See Comments) 03/23/2021   Shellfish allergy Hives 05/23/2021   Singulair [montelukast] Other (See Comments) 10/16/2018    Family History  Problem Relation Age of Onset   Hypertension Mother    Diabetes Father    Hypertension Father    Heart disease Father    Diabetes Sister    Hypertension Sister    Schizophrenia Sister    Thyroid disease Sister    Heart disease Sister    Throat cancer Paternal Uncle        smoker   Lung cancer Cousin        smoker   Colon cancer Neg Hx    Pancreatic cancer Neg Hx    Stomach cancer Neg Hx     Liver disease Neg Hx    Colon polyps Neg Hx    Esophageal cancer Neg Hx    Rectal cancer Neg Hx     Social History   Socioeconomic History   Marital status: Married    Spouse name: Not on file   Number of children: 1   Years of education: Not on file   Highest education level: Not on file  Occupational History   Occupation: retired  Tobacco Use   Smoking status: Never   Smokeless tobacco: Never  Vaping Use   Vaping Use: Never used  Substance and Sexual Activity   Alcohol use: Yes    Alcohol/week: 2.0 standard drinks    Types: 2 Glasses of wine per week   Drug use: No   Sexual activity: Yes    Partners: Male    Birth control/protection: Surgical    Comment: TVH--Still has ovaries  Other Topics Concern   Not on file  Social History Narrative   ** Merged History Encounter **       Social Determinants of Health   Financial Resource Strain: Low Risk    Difficulty of Paying Living Expenses: Not hard at all  Food Insecurity: No Food Insecurity   Worried About Charity fundraiser in the Last Year: Never true   [place] in the Last Year: Never true  Transportation Needs: No Transportation Needs   Lack of Transportation (Medical): No   Lack of Transportation (Non-Medical): No  Physical Activity: Insufficiently Active   Days of Exercise per Week: 3 days   Minutes of Exercise per Session: 30 min  Stress: No Stress Concern Present   Feeling of Stress : Not at all  Social Connections: Socially Integrated   Frequency of Communication with Friends and Family: More than three times a week   Frequency of Social Gatherings with Friends and Family: More than three times a week   Attends Religious Services: More than 4 times per year   Active Member of [employer] or Organizations: Yes   Attends Music therapist: More than 4 times per year   Marital Status: Married  Human resources officer Violence: Not At Risk   Fear of Current or Ex-Partner: No   Emotionally Abused: No    Physically Abused: No   Sexually Abused: No     Physical Exam: BP 130/86   Pulse 100   Ht 5' 4" (1.626 m)   Wt 139 lb (63 kg)   LMP 12/16/1993 (Approximate)   SpO2 97%   BMI 23.86 kg/m  Constitutional: generally well-appearing Psychiatric: alert and oriented x3 Abdomen: soft, nontender, nondistended, no obvious ascites, no peritoneal signs, normal bowel sounds No peripheral edema noted in lower extremities  Assessment and plan: 75 y.o. female with left lower quadrant discomforts  The Szym[place] going on for about a year.  They have eluded diagnosis despite blood work, CT scan, pelvic ultrasound.  Sometimes postprandial worsening, sometimes better with moving her bowels.  Ice suspect that this is an IBS-like, functional type discomfort.  I recommended colonoscopy to make sure we are not missing anything more serious and she was very happy to hear that we will be proceeding with colonoscopy.  I see no reason for further blood tests or imaging studies prior to the colonoscopy.  Please see the "Patient Instructions" section for addition details about the plan.  Owens Loffler, MD [facility] 11/14/2021, 1:52 PM   Total time on date of encounter was 30 minutes (this included time spent preparing to see the patient reviewing records; obtaining and/or reviewing separately obtained history; performing a medically appropriate exam and/or evaluation; counseling and educating the patient and family if present; ordering medications, tests or procedures if applicable; and documenting clinical information in the health record).

## 2021-11-14 NOTE — Patient Instructions (Addendum)
If you are age 75 or older, your body mass index should be between 23-30. Your Body mass index is 23.86 kg/m. If this is out of the aforementioned range listed, please consider follow up with your Primary Care Provider. ________________________________________________________  The [facility] providers would like to encourage you to use [name] to communicate with providers for non-urgent requests or questions.  Due to long hold times on the telephone, sending your provider a message by [facility] may be a faster and more efficient way to get a response.  Please allow 48 business hours for a response.  Please remember that this is for non-urgent requests.  _______________________________________________________  Sarah [name] have been scheduled for a colonoscopy. Please follow written instructions given to you at your visit today.  Please pick up your prep supplies at the pharmacy within the next 1-3 days. If you use inhalers (even only as needed), please bring them with you on the day of your procedure.  Due to recent changes in healthcare laws, you may see the results of your imaging and laboratory studies on MyChart before your provider has had a chance to review them.  We understand that in some cases there may be results that are confusing or concerning to you. Not all laboratory results come back in the same time frame and the provider may be waiting for multiple results in order to interpret others.  Please give [place] 48 hours in order for your provider to thoroughly review all the results before contacting the office for clarification of your results.   Thank you for entrusting me with your care and choosing [name].  Dr Ardis Hughs

## 2021-11-14 NOTE — Telephone Encounter (Signed)
Patient called to advise she no longer has the Suprep medication and would need another prep sent for her to start her prep tomorrow.

## 2021-11-15 ENCOUNTER — Encounter: Payer: Self-pay | Admitting: Gastroenterology

## 2021-11-16 ENCOUNTER — Other Ambulatory Visit: Payer: Self-pay

## 2021-11-16 ENCOUNTER — Encounter: Payer: Self-pay | Admitting: Gastroenterology

## 2021-11-16 ENCOUNTER — Ambulatory Visit (AMBULATORY_SURGERY_CENTER): Payer: Medicare Other | Admitting: Gastroenterology

## 2021-11-16 VITALS — BP 107/47 | HR 48 | Temp 97.1°F | Resp 13 | Ht 64.0 in | Wt 139.0 lb

## 2021-11-16 DIAGNOSIS — R1032 Left lower quadrant pain: Secondary | ICD-10-CM | POA: Diagnosis not present

## 2021-11-16 DIAGNOSIS — Z86010 Personal history of colonic polyps: Secondary | ICD-10-CM

## 2021-11-16 MED ORDER — HYOSCYAMINE SULFATE 0.125 MG SL SUBL: 0.1250 mg | SUBLINGUAL_TABLET | Freq: Two times a day (BID) | SUBLINGUAL | 3 refills | Status: DC | PRN

## 2021-11-16 MED ORDER — SODIUM CHLORIDE 0.9 % IV SOLN: 500.0000 mL | Freq: Once | INTRAVENOUS | Status: DC

## 2021-11-16 NOTE — Progress Notes (Signed)
  The recent H&P (dated 11/14/2021) was reviewed, the patient was examined and there is no change in the patients condition since that H&P was completed.   Sarah [name]  11/16/2021, 1:02 PM

## 2021-11-16 NOTE — Progress Notes (Signed)
To PACU, VSS. Report to [email or web address] 

## 2021-11-16 NOTE — Progress Notes (Signed)
Pt's states no medical or surgical changes since previsit or office visit. VS assessed by C.W 

## 2021-11-16 NOTE — Patient Instructions (Signed)
Handout provided on diverticulosis.   Trial of anti spasm medicine: new prescription today: Levsin 0.125mg  sublingual tablet, 1-2 tablets twice daily as needed for left lower quadrant (LLQ) pains.   YOU HAD AN ENDOSCOPIC PROCEDURE TODAY AT [facility]:   Refer to the procedure report that was given to you for any specific questions about what was found during the examination.  If the procedure report does not answer your questions, please call your gastroenterologist to clarify.  If you requested that your care partner not be given the details of your procedure findings, then the procedure report has been included in a sealed envelope for you to review at your convenience later.  YOU SHOULD EXPECT: Some feelings of bloating in the abdomen. Passage of more gas than usual.  Walking can help get rid of the air that was put into your GI tract during the procedure and reduce the bloating. If you had a lower endoscopy (such as a colonoscopy or flexible sigmoidoscopy) you may notice spotting of blood in your stool or on the toilet paper. If you underwent a bowel prep for your procedure, you may not have a normal bowel movement for a few days.  Please Note:  You might notice some irritation and congestion in your nose or some drainage.  This is from the oxygen used during your procedure.  There is no need for concern and it should clear up in a day or so.  SYMPTOMS TO REPORT IMMEDIATELY:  Following lower endoscopy (colonoscopy or flexible sigmoidoscopy):  Excessive amounts of blood in the stool  Significant tenderness or worsening of abdominal pains  Swelling of the abdomen that is new, acute  Fever of 100F or higher  For urgent or emergent issues, a gastroenterologist can be reached at any hour by calling [phone]. Do not use MyChart messaging for urgent concerns.    DIET:  We do recommend a small meal at first, but then you may proceed to your regular diet.  Drink plenty of  fluids but you should avoid alcoholic beverages for 24 hours.  ACTIVITY:  You should plan to take it easy for the rest of today and you should NOT DRIVE or use heavy machinery until tomorrow (because of the sedation medicines used during the test).    FOLLOW UP: Our staff will call the number listed on your records 48-72 hours following your procedure to check on you and address any questions or concerns that you may have regarding the information given to you following your procedure. If we do not reach you, we will leave a message.  We will attempt to reach you two times.  During this call, we will ask if you have developed any symptoms of COVID 19. If you develop any symptoms (ie: fever, flu-like symptoms, shortness of breath, cough etc.) before then, please call [phone].  If you test positive for Covid 19 in the 2 weeks post procedure, please call and report this information to [place].    If any biopsies were taken you will be contacted by phone or by letter within the next 1-3 weeks.  Please call us at [phone] if you have not heard about the biopsies in 3 weeks.    SIGNATURES/CONFIDENTIALITY: You and/or your care partner have signed paperwork which will be entered into your electronic medical record.  These signatures attest to the fact that that the information above on your After Visit Summary has been reviewed and is understood.  Full responsibility of  the confidentiality of this discharge information lies with you and/or your care-partner.

## 2021-11-16 NOTE — Op Note (Signed)
[facility] Patient Name: Sarah [name] Procedure Date: 11/16/2021 1:24 PM MRN: [id number] Endoscopist: Milus Banister , MD Age: 75 Referring MD:  Date of Birth: April 14, 1946 Gender: Female Account #: [id number] Procedure:                Colonoscopy Indications:              Left sided abdominal pain Medicines:                Monitored Anesthesia Care Procedure:                Pre-Anesthesia Assessment:                           - Prior to the procedure, a History and Physical                            was performed, and patient medications and                            allergies were reviewed. The patient's tolerance of                            previous anesthesia was also reviewed. The risks                            and benefits of the procedure and the sedation                            options and risks were discussed with the patient.                            All questions were answered, and informed consent                            was obtained. Prior Anticoagulants: The patient has                            taken no previous anticoagulant or antiplatelet                            agents. ASA Grade Assessment: II - A patient with                            mild systemic disease. After reviewing the risks                            and benefits, the patient was deemed in                            satisfactory condition to undergo the procedure.                           After obtaining informed consent, the colonoscope  was passed under direct vision. Throughout the                            procedure, the patient's blood pressure, pulse, and                            oxygen saturations were monitored continuously. The                            CF [id number] #[id number] was introduced through the anus                            and advanced to the the cecum, identified by                            appendiceal orifice and ileocecal  valve. The                            colonoscopy was performed without difficulty. The                            patient tolerated the procedure well. The quality                            of the bowel preparation was good. Scope In: 1:27:13 PM Scope Out: 1:44:02 PM Scope Withdrawal Time: 0 hours 7 minutes 26 seconds  Total Procedure Duration: 0 hours 16 minutes 49 seconds  Findings:                 Multiple small and large-mouthed diverticula were                            found in the left colon.                           The exam was otherwise without abnormality on                            direct and retroflexion views. Complications:            No immediate complications. Estimated blood loss:                            None. Estimated Blood Loss:     Estimated blood loss: none. Impression:               - Diverticulosis in the left colon.                           - The examination was otherwise normal on direct                            and retroflexion views.                           - No polyps or cancers. Recommendation:           -  Patient has a contact number available for                            emergencies. The signs and symptoms of potential                            delayed complications were discussed with the                            patient. Return to normal activities tomorrow.                            Written discharge instructions were provided to the                            patient.                           - Resume previous diet.                           - Continue present medications.                           - Trail of antispasm medicine: new prescription                            today: levsin .125mg  SL tab, 1-2 tabs twice daily                            PRN LLQ pains, disp 60 with 3 refills. Milus Banister, MD 11/16/2021 1:47:47 PM This report has been signed electronically.

## 2021-11-20 ENCOUNTER — Telehealth: Payer: Self-pay

## 2021-11-20 NOTE — Telephone Encounter (Signed)
  Follow up Call-  Call back number 11/16/2021  Post procedure Call Back phone  # [phone]  Permission to leave phone message Yes  Some recent data might be hidden     Patient questions:  Do you have a fever, pain , or abdominal swelling? no Pain Score  0 *  Have you tolerated food without any problems? Yes.    Have you been able to return to your normal activities? Yes.    Do you have any questions about your discharge instructions: Diet   No. Medications  No. Follow up visit  No.  Do you have questions or concerns about your Care? No.  Actions: * If pain score is 4 or above: No action needed, pain <4.

## 2021-12-03 ENCOUNTER — Ambulatory Visit: Payer: Medicare Other

## 2021-12-06 ENCOUNTER — Ambulatory Visit (INDEPENDENT_AMBULATORY_CARE_PROVIDER_SITE_OTHER): Payer: Medicare Other

## 2021-12-06 DIAGNOSIS — Z0000 Encounter for general adult medical examination without abnormal findings: Secondary | ICD-10-CM

## 2021-12-06 NOTE — Progress Notes (Signed)
Subjective:   Sarah [name] is a 75 y.o. female who presents for Medicare Annual (Subsequent) preventive examination.   I connected with Sarah [name] today by telephone and verified that I am speaking with the correct person using two identifiers. Location patient: home Location provider: work Persons participating in the virtual visit: patient, provider.   I discussed the limitations, risks, security and privacy concerns of performing an evaluation and management service by telephone and the availability of in person appointments. I also discussed with the patient that there may be a patient responsible charge related to this service. The patient expressed understanding and verbally consented to this telephonic visit.    Interactive audio and video telecommunications were attempted between this provider and patient, however failed, due to patient having technical difficulties OR patient did not have access to video capability.  We continued and completed visit with audio only.    Review of Systems     Cardiac Risk Factors include: advanced age (>10men, >71 women)     Objective:    Today's Vitals   There is no height or weight on file to calculate BMI.  Advanced Directives 12/06/2021 11/27/2020 11/22/2019 11/19/2018 11/16/2018 11/12/2017 10/08/2017  Does Patient Have a Medical Advance Directive? Yes Yes Yes Yes Yes Yes Yes  Type of Paramedic of [place];Living will [place];Living will [place];Living will [place];Living will [place];Living will [place];Living will [place];Living will  Does patient want to make changes to medical advance directive? - - Yes (MAU/Ambulatory/Procedural Areas - Information given) - No - Patient declined - -  Copy of [place] in Chart? No - copy requested No - copy requested No - copy  requested No - copy requested - No - copy requested -    Current Medications (verified) Outpatient Encounter Medications as of 12/06/2021  Medication Sig   Ascorbic Acid (VITAMIN C PO) Take by mouth.   atorvastatin (LIPITOR) 10 MG tablet TAKE 1 TAKE BY MOUTH EVERY 3 DAYS   beta carotene w/minerals (OCUVITE) tablet Take 1 tablet by mouth daily.   Calcium Citrate (CITRACAL PO) Take 2 tablets by mouth daily.   cetirizine (ZYRTEC) 10 MG tablet Take 1 tablet (10 mg total) by mouth daily.   clobetasol ointment (TEMOVATE) 0.05 % Apply topically 2 (two) times daily as needed. Use for 2 weeks at time as needed.  Use at bedtime twice a week as needed for a maintenance dose.   Coenzyme Q10 (COQ10) 200 MG CAPS Take 1 tablet by mouth daily.   EPIPEN 2-PAK 0.3 MG/0.3ML SOAJ injection Inject 1 mg into the muscle as needed. Reported on 06/28/2016   Ergocalciferol (VITAMIN D2) 400 units TABS Take 1 capsule by mouth daily.   esomeprazole (NEXIUM) 40 MG capsule TAKE 1 CAPSULE(40 MG) BY MOUTH DAILY   fish oil-omega-3 fatty acids 1000 MG capsule Take 2 g by mouth daily.   fluticasone (FLONASE) 50 MCG/ACT nasal spray Place 2 sprays into both nostrils daily.   lactobacillus acidophilus (BACID) TABS tablet Take 2 tablets by mouth 2 (two) times a week.   Multiple Vitamin (MULTIVITAMIN) capsule Take 1 capsule by mouth daily.   PROAIR HFA 108 (90 Base) MCG/ACT inhaler INL 2 PFS PO Q 4 TO 6 H PRN   valsartan (DIOVAN) 80 MG tablet TAKE 1 TABLET(80 MG) BY MOUTH DAILY   hyoscyamine (LEVSIN SL) 0.125 MG SL tablet Place 1  tablet (0.125 mg total) under the tongue 2 (two) times daily as needed (1-2 tablets twice daily as needed for LLQ pain). Place 1-2 tablets under tongue twice daily as needed for LLQ pain   sulfamethoxazole-trimethoprim (BACTRIM DS) 800-160 MG tablet Take 1 tablet by mouth 2 (two) times daily.   No facility-administered encounter medications on file as of 12/06/2021.    Allergies (verified) Evista  [raloxifene], Amoxicillin-pot clavulanate, Fosamax [alendronate], Other, Shellfish allergy, and Singulair [montelukast]   History: Past Medical History:  Diagnosis Date   Allergy    Arthritis    Asthma    Cancer ([place])    basal cell cancer--nose   Cataract    left eye and stable    Cervical disc disease    bulging   Diverticulitis    Diverticulosis    GERD (gastroesophageal reflux disease)    Hyperlipidemia    Hypertension    Osteopenia    Osteoporosis    PONV (postoperative nausea and vomiting)    Status post dilation of esophageal narrowing    Past Surgical History:  Procedure Laterality Date   ABDOMINAL HYSTERECTOMY  1995   TVH--still has ovaries   ABDOMINAL SACROCOLPOPEXY  2007   Halban culdoplasty & Burch procedure--Dr. Quincy Simmonds   APPENDECTOMY     Dr. Hassell Done   arthroscopic shoulder Left    Dr. Durward Fortes, bone spur   BLADDER SUSPENSION     CERVICAL FUSION     C-4, C-5 fusion   CHOLECYSTECTOMY     Dr. Georgina Quint   ESOPHAGEAL DILATION     HIP ARTHROPLASTY Left 11/07/2013   Procedure: ARTHROPLASTY BIPOLAR HIP;  Surgeon: Garald Balding, MD;  Location: [facility];  Service: Orthopedics;  Laterality: Left;   KNEE ARTHROSCOPY Bilateral    Dr. Durward Fortes   OTHER SURGICAL HISTORY  2011   posterior Colporrhaphy with Xenform biological graft--Dr. Allyn Kenner REPAIR  2011   posterior colporrhaphy with Xenform biological graft--Dr. Quincy Simmonds   UPPER GASTROINTESTINAL ENDOSCOPY     Family History  Problem Relation Age of Onset   Hypertension Mother    Diabetes Father    Hypertension Father    Heart disease Father    Diabetes Sister    Hypertension Sister    Schizophrenia Sister    Thyroid disease Sister    Heart disease Sister    Throat cancer Paternal Uncle        smoker   Lung cancer Cousin        smoker   Colon cancer Neg Hx    Pancreatic cancer Neg Hx    Stomach cancer Neg Hx    Liver disease Neg Hx    Colon polyps Neg Hx    Esophageal cancer Neg Hx     Rectal cancer Neg Hx    Social History   Socioeconomic History   Marital status: Married    Spouse name: Not on file   Number of children: 1   Years of education: Not on file   Highest education level: Not on file  Occupational History   Occupation: retired  Tobacco Use   Smoking status: Never   Smokeless tobacco: Never  Vaping Use   Vaping Use: Never used  Substance and Sexual Activity   Alcohol use: Yes    Alcohol/week: 2.0 standard drinks    Types: 2 Glasses of wine per week   Drug use: No   Sexual activity: Yes    Partners: Male    Birth control/protection: Surgical  Comment: TVH--Still has ovaries  Other Topics Concern   Not on file  Social History Narrative   ** Merged History Encounter **       Social Determinants of Health   Financial Resource Strain: Low Risk    Difficulty of Paying Living Expenses: Not hard at all  Food Insecurity: No Food Insecurity   Worried About Charity fundraiser in the Last Year: Never true   Arboriculturist in the Last Year: Never true  Transportation Needs: No Transportation Needs   Lack of Transportation (Medical): No   Lack of Transportation (Non-Medical): No  Physical Activity: Insufficiently Active   Days of Exercise per Week: 3 days   Minutes of Exercise per Session: 20 min  Stress: No Stress Concern Present   Feeling of Stress : Not at all  Social Connections: Socially Integrated   Frequency of Communication with Friends and Family: Twice a week   Frequency of Social Gatherings with Friends and Family: Twice a week   Attends Religious Services: More than 4 times per year   Active Member of [employer] or Organizations: Yes   Attends Music therapist: More than 4 times per year   Marital Status: Married    Tobacco Counseling Counseling given: Not Answered   Clinical Intake:  Pre-visit preparation completed: Yes  Pain : No/denies pain     Nutritional Risks: None  How often do you need to have  someone help you when you read instructions, pamphlets, or other written materials from your doctor or pharmacy?: 1 - Never What is the last grade level you completed in school?: college  Diabetic?no   Interpreter Needed?: No  Information entered by :: [place] of Daily Living In your present state of health, do you have any difficulty performing the following activities: 12/06/2021 02/20/2021  Hearing? N N  Vision? N N  Difficulty concentrating or making decisions? N N  Walking or climbing stairs? N N  Dressing or bathing? N N  Doing errands, shopping? N N  Preparing Food and eating ? N -  Using the Toilet? N -  In the past six months, have you accidently leaked urine? N -  Do you have problems with loss of bowel control? N -  Managing your Medications? N -  Managing your Finances? N -  Housekeeping or managing your Housekeeping? N -  Some recent data might be hidden    Patient Care Team: Midge Minium, MD as PCP - General (Family Medicine) Milus Banister, MD as Attending Physician (Gastroenterology) Harold Hedge, Darrick Grinder, MD as Consulting Physician (Allergy and Immunology) Nunzio Cobbs, MD as Consulting Physician (Obstetrics and Gynecology) Consuella Lose, MD as Consulting Physician (Neurosurgery) Otelia Sergeant, OD as Consulting Physician Jamesetta Geralds, [place] (Chiropractic Medicine) [facility], Skin Surgery  Indicate any recent Medical Services you may have received from other than [facility] providers in the past year (date may be approximate).     Assessment:   This is a routine wellness examination for [facility].  Hearing/Vision screen Vision Screening - Comments:: Annual eye exams wear glasses   Dietary issues and exercise activities discussed: Current Exercise Habits: Home exercise routine, Type of exercise: walking, Time (Minutes): 20, Frequency (Times/Week): 3, Weekly Exercise (Minutes/Week): 60, Intensity: Mild, Exercise limited by: None  identified   Goals Addressed             This Visit's Progress    Patient Stated   On  track    Maintain current health & drink more water       Depression Screen PHQ 2/9 Scores 12/06/2021 12/06/2021 09/06/2021 08/02/2021 07/10/2021 02/20/2021 11/27/2020  PHQ - 2 Score 0 0 0 0 0 0 0  PHQ- 9 Score - - - 0 - 0 -    Fall Risk Fall Risk  12/06/2021 09/06/2021 09/05/2021 08/02/2021 07/10/2021  Falls in the past year? 0 0 0 0 0  Number falls in past yr: 0 0 0 0 0  Injury with Fall? 0 0 0 0 0  Risk for fall due to : - No Fall Risks No Fall Risks No Fall Risks No Fall Risks  Follow up Falls evaluation completed Falls evaluation completed Falls evaluation completed Falls evaluation completed Falls evaluation completed    FALL RISK PREVENTION PERTAINING TO THE HOME:  Any stairs in or around the home? No  If so, are there any without handrails? No  Home free of loose throw rugs in walkways, pet beds, electrical cords, etc? Yes  Adequate lighting in your home to reduce risk of falls? Yes   ASSISTIVE DEVICES UTILIZED TO PREVENT FALLS:  Life alert? No  Use of a cane, walker or w/c? No  Grab bars in the bathroom? No  Shower chair or bench in shower? No  Elevated toilet seat or a handicapped toilet? No    Cognitive Function: Normal cognitive status assessed by direct observation by this Nurse Health Advisor. No abnormalities found.   MMSE - Mini Mental State Exam 11/19/2018 11/12/2017  Orientation to time 5 5  Orientation to Place 5 5  Registration 3 3  Attention/ Calculation 5 5  Recall 3 3  Language- name 2 objects 2 2  Language- repeat 1 1  Language- follow 3 step command 3 3  Language- read & follow direction 1 1  Write a sentence 1 1  Copy design 1 1  Total score 30 30     6CIT Screen 11/27/2020  What Year? 0 points  What month? 0 points  What time? 0 points  Count back from 20 0 points  Months in reverse 0 points  Repeat phrase 0 points  Total Score 0     Immunizations Immunization History  Administered Date(s) Administered   Fluad Quad(high Dose 65+) 09/28/2021   Influenza, High Dose Seasonal PF 09/21/2014, 09/02/2016, 03/25/2017, 07/15/2018, 01/20/2020, 09/12/2020   Influenza,inj,Quad PF,6+ Mos 09/05/2015, 09/15/2018, 10/02/2018, 08/10/2019   Influenza-Unspecified 08/15/2017   PFIZER Comirnaty(Gray Top)Covid-19 Tri-Sucrose Vaccine 04/20/2021   PFIZER(Purple Top)SARS-COV-2 Vaccination 12/27/2019, 01/17/2020, 09/12/2020, 03/23/2021   Pneumococcal Conjugate-13 05/22/2015   Pneumococcal Polysaccharide-23 11/04/2016, 03/25/2017, 07/15/2018, 09/21/2018, 01/20/2020, 03/23/2021   Zoster Recombinat (Shingrix) 03/24/2017, 07/15/2017    TDAP status: Due, Education has been provided regarding the importance of this vaccine. Advised may receive this vaccine at local pharmacy or Health Dept. Aware to provide a copy of the vaccination record if obtained from local pharmacy or Health Dept. Verbalized acceptance and understanding.  Flu Vaccine status: Up to date  Pneumococcal vaccine status: Up to date  Covid-19 vaccine status: Completed vaccines  Qualifies for Shingles Vaccine? Yes   Zostavax completed Yes   Shingrix Completed?: Yes  Screening Tests Health Maintenance  Topic Date Due   COVID-19 Vaccine (5 - Booster for Pfizer series) 06/15/2021   TETANUS/TDAP  07/10/2022 (Originally 02/22/1965)   Fecal DNA (Cologuard)  12/30/2022   Pneumonia Vaccine 73+ Years old  Completed   INFLUENZA VACCINE  Completed   DEXA SCAN  Completed  Hepatitis C Screening  Completed   Zoster Vaccines- Shingrix  Completed   HPV VACCINES  Aged Out    Health Maintenance  Health Maintenance Due  Topic Date Due   COVID-19 Vaccine (5 - Booster for Pfizer series) 06/15/2021    Colorectal cancer screening: No longer required.   Mammogram status: No longer required due to age.  Bone Density status: Completed 09/11/2015. Results reflect: Bone density  results: OSTEOPENIA. Repeat every 5 years.  Lung Cancer Screening: (Low Dose CT Chest recommended if Age 53-80 years, 30 pack-year currently smoking OR have quit w/in 15years.) does not qualify.   Lung Cancer Screening Referral: n/a  Additional Screening:  Hepatitis C Screening: does not qualify; Completed 05/06/2017  Vision Screening: Recommended annual ophthalmology exams for early detection of glaucoma and other disorders of the eye. Is the patient up to date with their annual eye exam?  Yes  Who is the provider or what is the name of the office in which the patient attends annual eye exams? Dr.Barts  If pt is not established with a provider, would they like to be referred to a provider to establish care? No .   Dental Screening: Recommended annual dental exams for proper oral hygiene  Community Resource Referral / Chronic Care Management: CRR required this visit?  No   CCM required this visit?  No      Plan:     I have personally reviewed and noted the following in the patients chart:   Medical and social history Use of alcohol, tobacco or illicit drugs  Current medications and supplements including opioid prescriptions.  Functional ability and status Nutritional status Physical activity Advanced directives List of other physicians Hospitalizations, surgeries, and ER visits in previous 12 months Vitals Screenings to include cognitive, depression, and falls Referrals and appointments  In addition, I have reviewed and discussed with patient certain preventive protocols, quality metrics, and best practice recommendations. A written personalized care plan for preventive services as well as general preventive health recommendations were provided to patient.     Randel Pigg, LPN   16/09/[id number]   Nurse Notes: none

## 2021-12-06 NOTE — Patient Instructions (Signed)
Sarah [name] , Thank you for taking time to come for your Medicare Wellness Visit. I appreciate your ongoing commitment to your health goals. Please review the following plan we discussed and let me know if I can assist you in the future.   Screening recommendations/referrals: Colonoscopy: no longer required  Mammogram: no longer required  Bone Density: 09/11/2015  declines referral  Recommended yearly ophthalmology/optometry visit for glaucoma screening and checkup Recommended yearly dental visit for hygiene and checkup  Vaccinations: Influenza vaccine: completed  Pneumococcal vaccine: completed  Tdap vaccine: due  Shingles vaccine: completed     Advanced directives: will provide copies  Conditions/risks identified: none   Next appointment: none    Preventive Care 80 Years and Older, Female Preventive care refers to lifestyle choices and visits with your health care provider that can promote health and wellness. What does preventive care include? A yearly physical exam. This is also called an annual well check. Dental exams once or twice a year. Routine eye exams. Ask your health care provider how often you should have your eyes checked. Personal lifestyle choices, including: Daily care of your teeth and gums. Regular physical activity. Eating a healthy diet. Avoiding tobacco and drug use. Limiting alcohol use. Practicing safe sex. Taking low-dose aspirin every day. Taking vitamin and mineral supplements as recommended by your health care provider. What happens during an annual well check? The services and screenings done by your health care provider during your annual well check will depend on your age, overall health, lifestyle risk factors, and family history of disease. Counseling  Your health care provider may ask you questions about your: Alcohol use. Tobacco use. Drug use. Emotional well-being. Home and relationship well-being. Sexual activity. Eating  habits. History of falls. Memory and ability to understand (cognition). Work and work Statistician. Reproductive health. Screening  You may have the following tests or measurements: Height, weight, and BMI. Blood pressure. Lipid and cholesterol levels. These may be checked every 5 years, or more frequently if you are over 90 years old. Skin check. Lung cancer screening. You may have this screening every year starting at age 79 if you have a 30-pack-year history of smoking and currently smoke or have quit within the past 15 years. Fecal occult blood test (FOBT) of the stool. You may have this test every year starting at age 64. Flexible sigmoidoscopy or colonoscopy. You may have a sigmoidoscopy every 5 years or a colonoscopy every 10 years starting at age 33. Hepatitis C blood test. Hepatitis B blood test. Sexually transmitted disease (STD) testing. Diabetes screening. This is done by checking your blood sugar (glucose) after you have not eaten for a while (fasting). You may have this done every 1-3 years. Bone density scan. This is done to screen for osteoporosis. You may have this done starting at age 78. Mammogram. This may be done every 1-2 years. Talk to your health care provider about how often you should have regular mammograms. Talk with your health care provider about your test results, treatment options, and if necessary, the need for more tests. Vaccines  Your health care provider may recommend certain vaccines, such as: Influenza vaccine. This is recommended every year. Tetanus, diphtheria, and acellular pertussis (Tdap, Td) vaccine. You may need a Td booster every 10 years. Zoster vaccine. You may need this after age 37. Pneumococcal 13-valent conjugate (PCV13) vaccine. One dose is recommended after age 40. Pneumococcal polysaccharide (PPSV23) vaccine. One dose is recommended after age 58. Talk to your health care  provider about which screenings and vaccines you need and how  often you need them. This information is not intended to replace advice given to you by your health care provider. Make sure you discuss any questions you have with your health care provider. Document Released: 12/29/2015 Document Revised: 08/21/2016 Document Reviewed: 10/03/2015 Elsevier Interactive Patient Education  2017 [place] Prevention in the Home Falls can cause injuries. They can happen to people of all ages. There are many things you can do to make your home safe and to help prevent falls. What can I do on the outside of my home? Regularly fix the edges of walkways and driveways and fix any cracks. Remove anything that might make you trip as you walk through a door, such as a raised step or threshold. Trim any bushes or trees on the path to your home. Use bright outdoor lighting. Clear any walking paths of anything that might make someone trip, such as rocks or tools. Regularly check to see if handrails are loose or broken. Make sure that both sides of any steps have handrails. Any raised decks and porches should have guardrails on the edges. Have any leaves, snow, or ice cleared regularly. Use sand or salt on walking paths during winter. Clean up any spills in your garage right away. This includes oil or grease spills. What can I do in the bathroom? Use night lights. Install grab bars by the toilet and in the tub and shower. Do not use towel bars as grab bars. Use non-skid mats or decals in the tub or shower. If you need to sit down in the shower, use a plastic, non-slip stool. Keep the floor dry. Clean up any water that spills on the floor as soon as it happens. Remove soap buildup in the tub or shower regularly. Attach bath mats securely with double-sided non-slip rug tape. Do not have throw rugs and other things on the floor that can make you trip. What can I do in the bedroom? Use night lights. Make sure that you have a light by your bed that is easy to  reach. Do not use any sheets or blankets that are too big for your bed. They should not hang down onto the floor. Have a firm chair that has side arms. You can use this for support while you get dressed. Do not have throw rugs and other things on the floor that can make you trip. What can I do in the kitchen? Clean up any spills right away. Avoid walking on wet floors. Keep items that you use a lot in easy-to-reach places. If you need to reach something above you, use a strong step stool that has a grab bar. Keep electrical cords out of the way. Do not use floor polish or wax that makes floors slippery. If you must use wax, use non-skid floor wax. Do not have throw rugs and other things on the floor that can make you trip. What can I do with my stairs? Do not leave any items on the stairs. Make sure that there are handrails on both sides of the stairs and use them. Fix handrails that are broken or loose. Make sure that handrails are as long as the stairways. Check any carpeting to make sure that it is firmly attached to the stairs. Fix any carpet that is loose or worn. Avoid having throw rugs at the top or bottom of the stairs. If you do have throw rugs, attach them to the  floor with carpet tape. Make sure that you have a light switch at the top of the stairs and the bottom of the stairs. If you do not have them, ask someone to add them for you. What else can I do to help prevent falls? Wear shoes that: Do not have high heels. Have rubber bottoms. Are comfortable and fit you well. Are closed at the toe. Do not wear sandals. If you use a stepladder: Make sure that it is fully opened. Do not climb a closed stepladder. Make sure that both sides of the stepladder are locked into place. Ask someone to hold it for you, if possible. Clearly mark and make sure that you can see: Any grab bars or handrails. First and last steps. Where the edge of each step is. Use tools that help you move  around (mobility aids) if they are needed. These include: Canes. Walkers. Scooters. Crutches. Turn on the lights when you go into a dark area. Replace any light bulbs as soon as they burn out. Set up your furniture so you have a clear path. Avoid moving your furniture around. If any of your floors are uneven, fix them. If there are any pets around you, be aware of where they are. Review your medicines with your doctor. Some medicines can make you feel dizzy. This can increase your chance of falling. Ask your doctor what other things that you can do to help prevent falls. This information is not intended to replace advice given to you by your health care provider. Make sure you discuss any questions you have with your health care provider. Document Released: 09/28/2009 Document Revised: 05/09/2016 Document Reviewed: 01/06/2015 Elsevier Interactive Patient Education  2017 [employer].

## 2021-12-09 IMAGING — MG MM DIGITAL SCREENING BILAT W/ TOMO AND CAD
8 series · 8 of 24 positions shown · non-contrast
Comparison: Previous exam(s).

CLINICAL DATA: Screening.

EXAM:
DIGITAL SCREENING BILATERAL MAMMOGRAM WITH TOMOSYNTHESIS AND CAD
TECHNIQUE: Bilateral screening digital craniocaudal and mediolateral oblique
mammograms were obtained. Bilateral screening digital breast
tomosynthesis was performed. The images were evaluated with
computer-aided detection.

[L CC synth-2D]
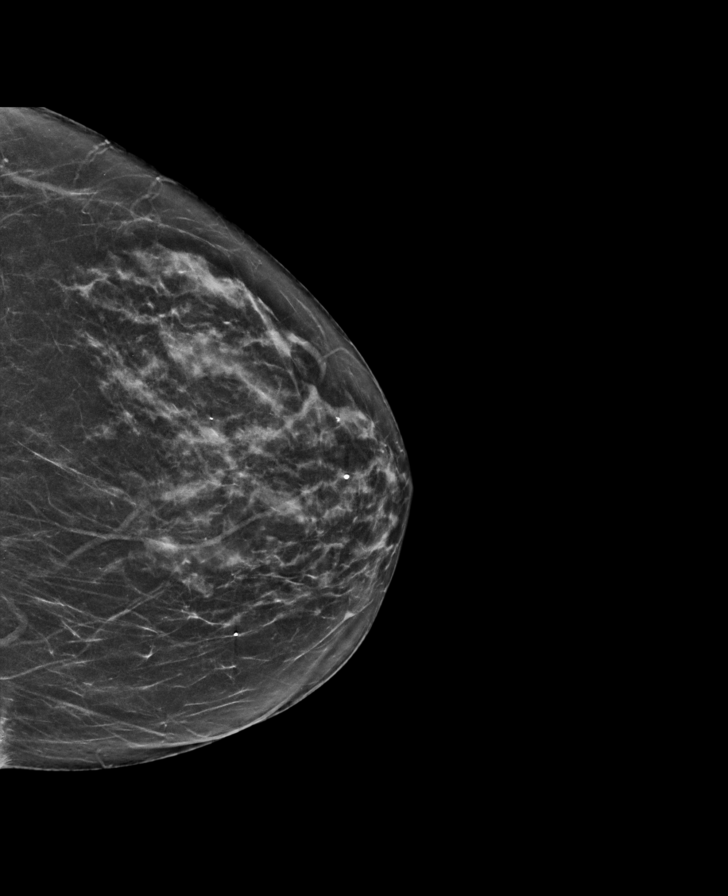

[L MLO synth-2D]
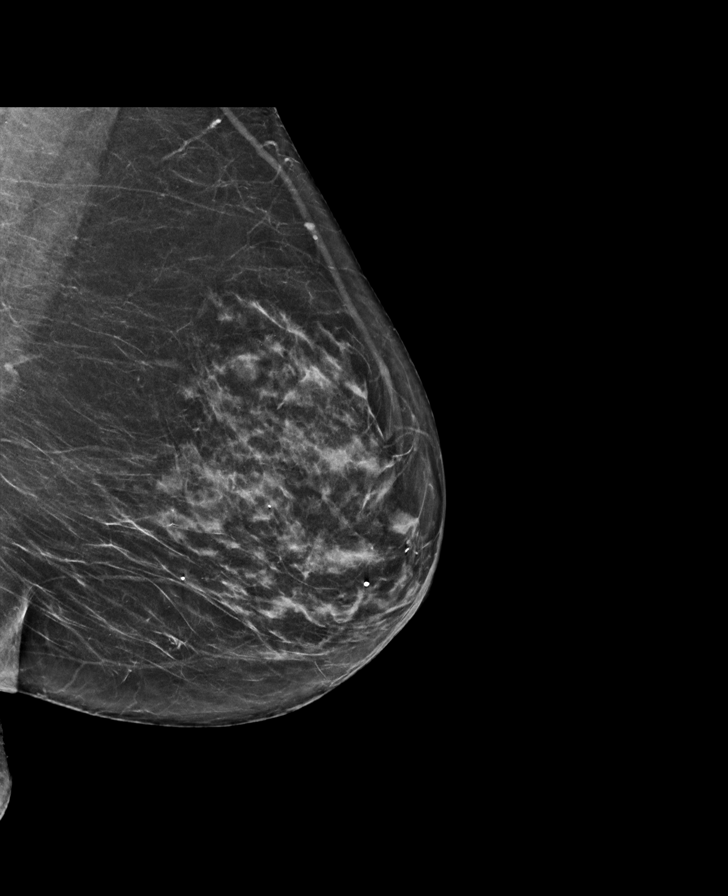

[R CC synth-2D]
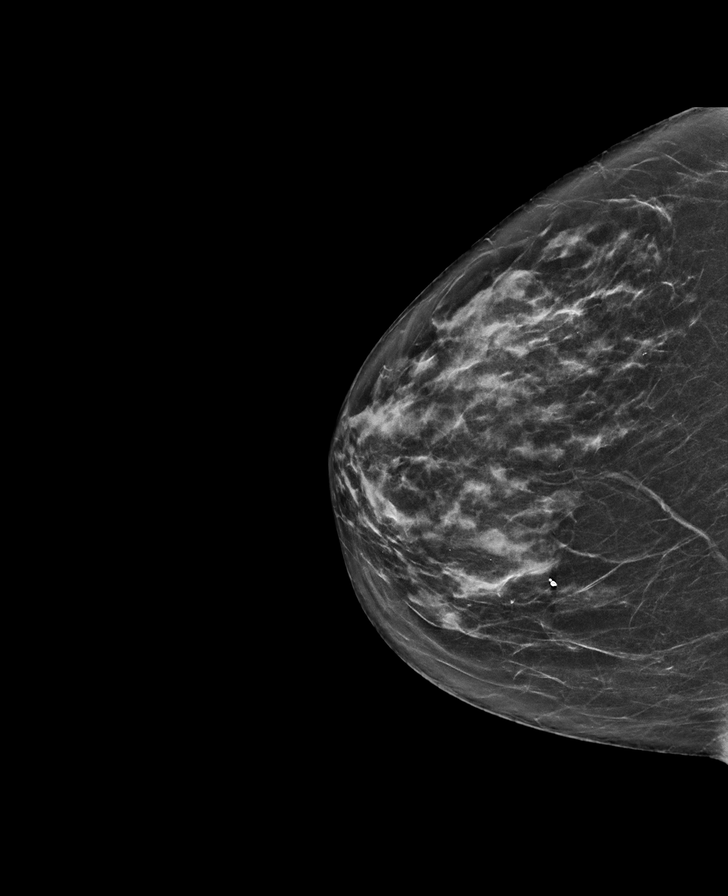

[R MLO synth-2D]
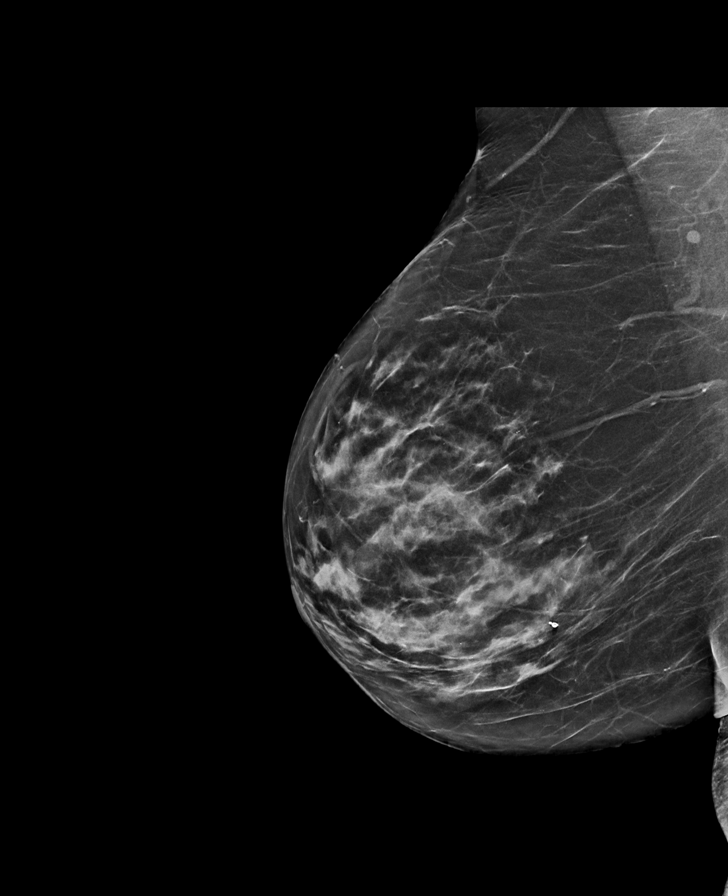

[R CC tomo · tomo slice 31/61.0]
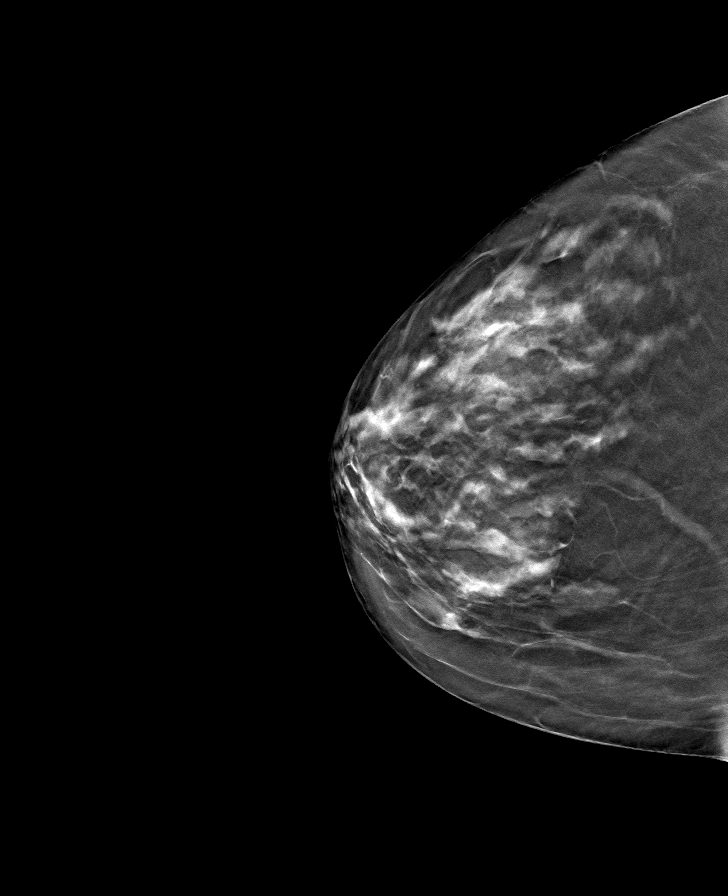

[L MLO tomo · tomo slice 33/65.0]
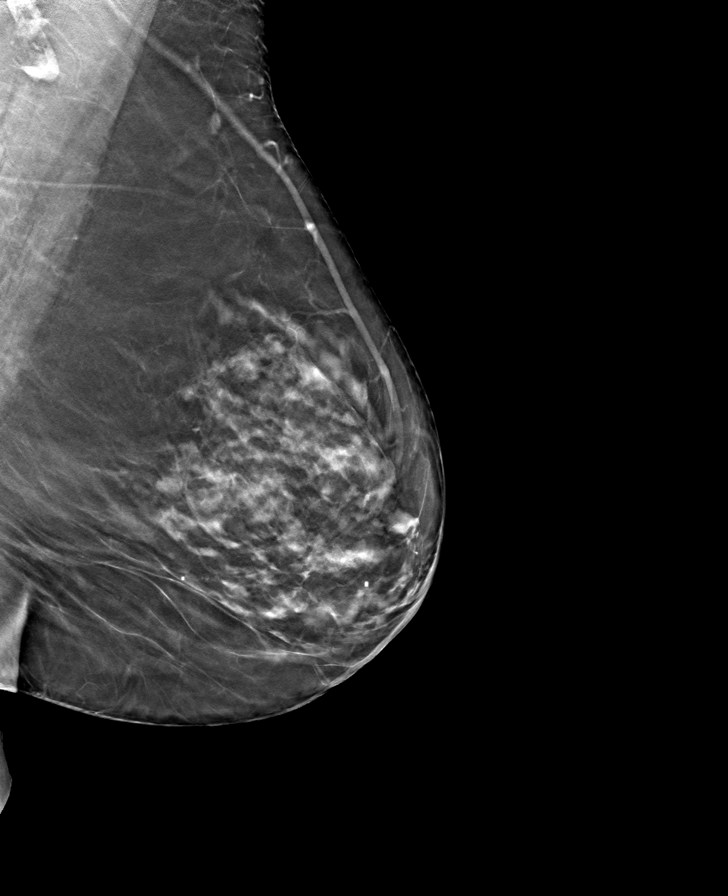

[L CC tomo · tomo slice 29/58.0]
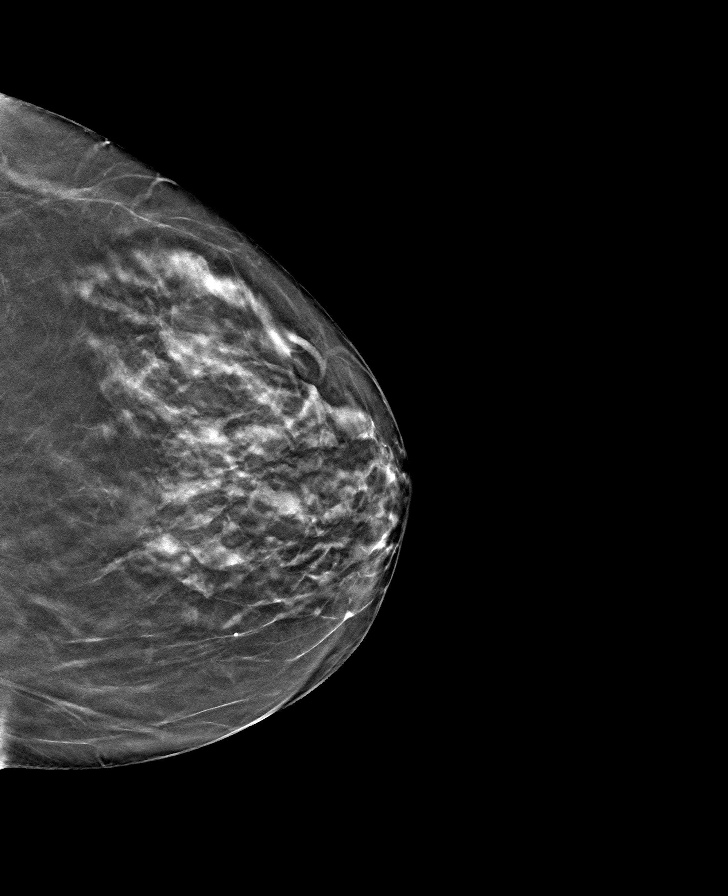

[R MLO tomo · tomo slice 33/66.0]
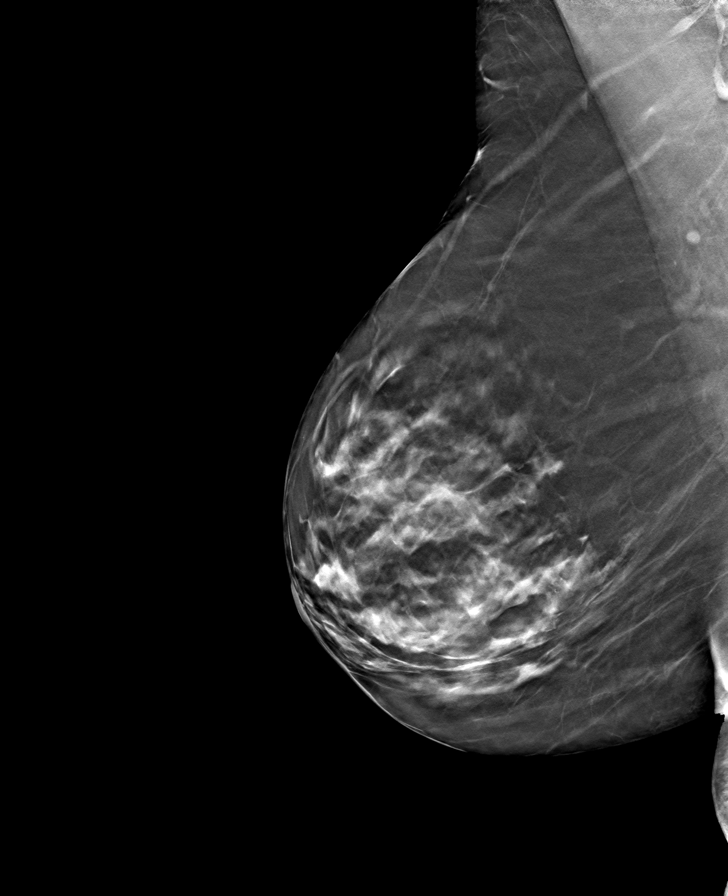

[8 of 24 positions shown; findings below may reference images not displayed]

ACR Breast Density Category c: The breast tissue is heterogeneously
dense, which may obscure small masses.
FINDINGS: There are no findings suspicious for malignancy. The images were
evaluated with computer-aided detection.
IMPRESSION: No mammographic evidence of malignancy. A result letter of this
screening mammogram will be mailed directly to the patient.

RECOMMENDATION:
Screening mammogram in one year. (Code:T4-5-GWO)

BI-RADS CATEGORY  1: Negative.

## 2021-12-21 ENCOUNTER — Other Ambulatory Visit: Payer: Self-pay | Admitting: Family Medicine

## 2022-01-18 ENCOUNTER — Other Ambulatory Visit: Payer: Self-pay

## 2022-01-18 ENCOUNTER — Other Ambulatory Visit: Payer: Self-pay | Admitting: Family Medicine

## 2022-01-18 DIAGNOSIS — I10 Essential (primary) hypertension: Secondary | ICD-10-CM

## 2022-01-24 ENCOUNTER — Other Ambulatory Visit: Payer: Self-pay | Admitting: Obstetrics and Gynecology

## 2022-01-24 DIAGNOSIS — Z1231 Encounter for screening mammogram for malignant neoplasm of breast: Secondary | ICD-10-CM

## 2022-01-30 ENCOUNTER — Ambulatory Visit (INDEPENDENT_AMBULATORY_CARE_PROVIDER_SITE_OTHER): Payer: Medicare Other | Admitting: Family Medicine

## 2022-01-30 ENCOUNTER — Encounter: Payer: Self-pay | Admitting: Family Medicine

## 2022-01-30 VITALS — BP 118/78 | HR 62 | Temp 97.8°F | Resp 16 | Wt 145.6 lb

## 2022-01-30 DIAGNOSIS — E785 Hyperlipidemia, unspecified: Secondary | ICD-10-CM

## 2022-01-30 DIAGNOSIS — I10 Essential (primary) hypertension: Secondary | ICD-10-CM

## 2022-01-30 DIAGNOSIS — M810 Age-related osteoporosis without current pathological fracture: Secondary | ICD-10-CM

## 2022-01-30 MED ORDER — ATORVASTATIN CALCIUM 10 MG PO TABS: ORAL_TABLET | ORAL | 1 refills | Status: DC

## 2022-01-30 NOTE — Assessment & Plan Note (Signed)
Chronic problem.  Well controlled on Valsartan.  Currently asymptomatic.  Check labs due to ARB but no anticipated med changes.

## 2022-01-30 NOTE — Progress Notes (Signed)
° °  Subjective:    Patient ID: Sherry Ruffing, female    DOB: November 04, 1946, 76 y.o.   MRN: [id number]  HPI Hyperlipidemia- chronic problem, on Lipitor 10mg  daily.  No abd pain, N/V.  HTN- chronic problem, on Valsartan 80mg  daily.  No CP, SOB, HAs, visual changes, edema.  Osteoporosis- pt is overdue for repeat DEXA.  Last done 2016 at [facility].  Pt has been avoiding repeating her DEXA b/c she feels that there is no reason to test if she won't accept treatment.  Pt refuses to take bisphosphonates due to side effects.  Had rxn to Evista previously.  Is aware of Reclast and Prolia.  Reclast scares her b/c 'once it's in, it's in'.  Remembers there was something concerning about Prolia but needs to look.  Review of Systems For ROS see HPI   This visit occurred during the SARS-CoV-2 public health emergency.  Safety protocols were in place, including screening questions prior to the visit, additional usage of staff PPE, and extensive cleaning of exam room while observing appropriate contact time as indicated for disinfecting solutions.      Objective:   Physical Exam Vitals reviewed.  Constitutional:      General: She is not in acute distress.    Appearance: Normal appearance. She is well-developed. She is not ill-appearing.  HENT:     Head: Normocephalic and atraumatic.  Eyes:     Conjunctiva/sclera: Conjunctivae normal.     Pupils: Pupils are equal, round, and reactive to light.  Neck:     Thyroid: No thyromegaly.  Cardiovascular:     Rate and Rhythm: Normal rate and regular rhythm.     Pulses: Normal pulses.     Heart sounds: Normal heart sounds. No murmur heard. Pulmonary:     Effort: Pulmonary effort is normal. No respiratory distress.     Breath sounds: Normal breath sounds.  Abdominal:     General: There is no distension.     Palpations: Abdomen is soft.     Tenderness: There is no abdominal tenderness.  Musculoskeletal:     Cervical back: Normal range of motion and neck supple.      Right lower leg: No edema.     Left lower leg: No edema.  Lymphadenopathy:     Cervical: No cervical adenopathy.  Skin:    General: Skin is warm and dry.  Neurological:     Mental Status: She is alert and oriented to person, place, and time.  Psychiatric:        Behavior: Behavior normal.          Assessment & Plan:

## 2022-01-30 NOTE — Assessment & Plan Note (Signed)
Pt admits she has been avoiding a repeat DEXA scan and is not interested in doing one at this time b/c she is not willing to take any of the available txs for osteoporosis.  Reviewed that she was previously intolerant to Evista and Fosamax.  Discussed Reclast and Prolia but pt is not interested at this time.

## 2022-01-30 NOTE — Patient Instructions (Addendum)
Schedule your complete physical in 6 months We'll notify you of your lab results and make any changes if needed Keep up the good work on healthy diet and regular exercise- you look great! Call with any questions or concerns Stay Safe!  Stay Healthy! Happy Spring!! [place]!!!

## 2022-01-30 NOTE — Assessment & Plan Note (Signed)
Chronic problem.  Tolerating Lipitor w/o difficulty.  Check labs.  Adjust meds prn  

## 2022-01-31 LAB — CBC WITH DIFFERENTIAL/PLATELET
Basophils Absolute: 0.1 10*3/uL (ref 0.0–0.1)
Basophils Relative: 1.1 % (ref 0.0–3.0)
Eosinophils Absolute: 0.3 10*3/uL (ref 0.0–0.7)
Eosinophils Relative: 4.9 % (ref 0.0–5.0)
HCT: 38.7 % (ref 36.0–46.0)
Hemoglobin: 13.1 g/dL (ref 12.0–15.0)
Lymphocytes Relative: 30.2 % (ref 12.0–46.0)
Lymphs Abs: 1.6 10*3/uL (ref 0.7–4.0)
MCHC: 33.9 g/dL (ref 30.0–36.0)
MCV: 88.3 fl (ref 78.0–100.0)
Monocytes Absolute: 0.4 10*3/uL (ref 0.1–1.0)
Monocytes Relative: 8 % (ref 3.0–12.0)
Neutro Abs: 2.9 10*3/uL (ref 1.4–7.7)
Neutrophils Relative %: 55.8 % (ref 43.0–77.0)
Platelets: 224 10*3/uL (ref 150.0–400.0)
RBC: 4.38 Mil/uL (ref 3.87–5.11)
RDW: 13.6 % (ref 11.5–15.5)
WBC: 5.2 10*3/uL (ref 4.0–10.5)

## 2022-01-31 LAB — HEPATIC FUNCTION PANEL
ALT: 12 U/L (ref 0–35)
AST: 17 U/L (ref 0–37)
Albumin: 4.3 g/dL (ref 3.5–5.2)
Alkaline Phosphatase: 54 U/L (ref 39–117)
Bilirubin, Direct: 0.1 mg/dL (ref 0.0–0.3)
Total Bilirubin: 0.4 mg/dL (ref 0.2–1.2)
Total Protein: 7.1 g/dL (ref 6.0–8.3)

## 2022-01-31 LAB — BASIC METABOLIC PANEL
BUN: 18 mg/dL (ref 6–23)
CO2: 30 mEq/L (ref 19–32)
Calcium: 9.6 mg/dL (ref 8.4–10.5)
Chloride: 104 mEq/L (ref 96–112)
Creatinine, Ser: 0.96 mg/dL (ref 0.40–1.20)
GFR: 57.7 mL/min — ABNORMAL LOW (ref >60.00)
Glucose, Bld: 77 mg/dL (ref 70–99)
Potassium: 3.8 mEq/L (ref 3.5–5.1)
Sodium: 139 mEq/L (ref 135–145)

## 2022-01-31 LAB — LIPID PANEL
Cholesterol: 185 mg/dL (ref 0–200)
HDL: 74.4 mg/dL (ref >39.00)
LDL Cholesterol: 92 mg/dL (ref 0–99)
NonHDL: 110.86
Total CHOL/HDL Ratio: 2
Triglycerides: 96 mg/dL (ref 0.0–149.0)
VLDL: 19.2 mg/dL (ref 0.0–40.0)

## 2022-01-31 LAB — VITAMIN D 25 HYDROXY (VIT D DEFICIENCY, FRACTURES): VITD: 82.47 ng/mL (ref 30.00–100.00)

## 2022-01-31 LAB — TSH: TSH: 1.54 u[IU]/mL (ref 0.35–5.50)

## 2022-03-11 ENCOUNTER — Ambulatory Visit
Admission: RE | Admit: 2022-03-11 | Discharge: 2022-03-11 | Disposition: A | Discharge status: Home / Self Care | Payer: Medicare Other | Source: Ambulatory Visit | Attending: Obstetrics and Gynecology | Admitting: Obstetrics and Gynecology

## 2022-03-11 DIAGNOSIS — Z1231 Encounter for screening mammogram for malignant neoplasm of breast: Secondary | ICD-10-CM

## 2022-03-12 ENCOUNTER — Other Ambulatory Visit: Payer: Self-pay

## 2022-03-12 ENCOUNTER — Encounter: Payer: Self-pay | Admitting: Registered Nurse

## 2022-03-12 ENCOUNTER — Encounter: Payer: Self-pay | Admitting: Family Medicine

## 2022-03-12 ENCOUNTER — Ambulatory Visit (INDEPENDENT_AMBULATORY_CARE_PROVIDER_SITE_OTHER): Payer: Medicare Other | Admitting: Registered Nurse

## 2022-03-12 VITALS — BP 102/67 | HR 96 | Temp 97.6°F | Resp 18 | Ht 64.0 in | Wt 143.4 lb

## 2022-03-12 DIAGNOSIS — R197 Diarrhea, unspecified: Secondary | ICD-10-CM | POA: Diagnosis not present

## 2022-03-12 DIAGNOSIS — R1114 Bilious vomiting: Secondary | ICD-10-CM

## 2022-03-12 MED ORDER — ONDANSETRON 4 MG PO TBDP: 4.0000 mg | ORAL_TABLET | Freq: Three times a day (TID) | ORAL | 0 refills | Status: DC | PRN

## 2022-03-12 NOTE — Patient Instructions (Signed)
Ms. Gwinn - ? ?Always a pleasure ? ?Call if things don't get better or worsen in the next 24-48 hours. ? ?Bland foods, hydrate, and rest!!! ? ?Thank you ? ?Rich  ?

## 2022-03-12 NOTE — Progress Notes (Signed)
? ?Acute Office Visit ? ?Subjective:  ? ? Patient ID: Sarah [name], female    DOB: 29-Dec-1945, 76 y.o.   MRN: [id number] ? ?Chief Complaint  ?Patient presents with  ? Diarrhea  ?  Patient states she has been experiencing some vomiting and some diarrhea since last night. Patient states she had a fever of 100 with some chills.  ? ? ?HPI ?Patient is in today for diarrhea, fever, chills. ? ?Onset last night. Fever up to 100.  ?Diarrhea and vomiting throughout the night ?Better today - no vomiting, diarrhea as well.  ?Notes "questionable lunch" on Sunday - bad chicken at a restaurant.  ?No blood in stool or vomit. No melena, brbpr, coffee ground emesis.  ? ?Has had diet coke and banana popsicle today.  ? ?No sick contacts. ? ?Outpatient Medications Prior to Visit  ?Medication Sig Dispense Refill  ? Ascorbic Acid (VITAMIN C PO) Take by mouth.    ? atorvastatin (LIPITOR) 10 MG tablet TAKE 1 TAKE BY MOUTH EVERY 3 DAYS 90 tablet 1  ? beta carotene w/minerals (OCUVITE) tablet Take 1 tablet by mouth daily.    ? Calcium Citrate (CITRACAL PO) Take 2 tablets by mouth daily.    ? cetirizine (ZYRTEC) 10 MG tablet Take 1 tablet (10 mg total) by mouth daily. 30 tablet 11  ? clobetasol ointment (TEMOVATE) 0.05 % Apply topically 2 (two) times daily as needed. Use for 2 weeks at time as needed.  Use at bedtime twice a week as needed for a maintenance dose. 30 g 1  ? Coenzyme Q10 (COQ10) 200 MG CAPS Take 1 tablet by mouth daily.    ? EPIPEN 2-PAK 0.3 MG/0.3ML SOAJ injection Inject 1 mg into the muscle as needed. Reported on 06/28/2016    ? Ergocalciferol (VITAMIN D2) 400 units TABS Take 1 capsule by mouth daily.    ? esomeprazole (NEXIUM) 40 MG capsule TAKE 1 CAPSULE(40 MG) BY MOUTH DAILY 90 capsule 1  ? fish oil-omega-3 fatty acids 1000 MG capsule Take 2 g by mouth daily.    ? fluticasone (FLONASE) 50 MCG/ACT nasal spray Place 2 sprays into both nostrils daily.    ? lactobacillus acidophilus (BACID) TABS tablet Take 2 tablets by mouth 2  (two) times a week.    ? Multiple Vitamin (MULTIVITAMIN) capsule Take 1 capsule by mouth daily.    ? PROAIR HFA 108 (90 Base) MCG/ACT inhaler INL 2 PFS PO Q 4 TO 6 H PRN  0  ? valsartan (DIOVAN) 80 MG tablet TAKE 1 TABLET(80 MG) BY MOUTH DAILY 90 tablet 0  ? ?No facility-administered medications prior to visit.  ? ? ?Review of Systems  ?Constitutional: Negative.   ?HENT: Negative.    ?Eyes: Negative.   ?Respiratory: Negative.    ?Cardiovascular: Negative.   ?Gastrointestinal:  Positive for abdominal pain (generalized), diarrhea, nausea and vomiting.  ?Genitourinary: Negative.   ?Musculoskeletal: Negative.   ?Skin: Negative.   ?Neurological: Negative.   ?Psychiatric/Behavioral: Negative.    ?All other systems reviewed and are negative. ? ?   ?Objective:  ?  ?BP 102/67   Pulse 96   Temp 97.6 ?F (36.4 ?C) (Temporal)   Resp 18   Ht '5\' 4"'$  (1.626 m)   Wt 143 lb 6.4 oz (65 kg)   LMP 12/16/1993 (Approximate)   SpO2 99%   BMI 24.61 kg/m?  ?Physical Exam ? ?No results found for any visits on 03/12/22. ? ? ?   ?Assessment & Plan:  ?1. Bilious vomiting  with nausea ?- ondansetron (ZOFRAN-ODT) 4 MG disintegrating tablet; Take 1 tablet (4 mg total) by mouth every 8 (eight) hours as needed for nausea or vomiting.  Dispense: 20 tablet; Refill: 0 ? ?2. Acute diarrhea ?- ondansetron (ZOFRAN-ODT) 4 MG disintegrating tablet; Take 1 tablet (4 mg total) by mouth every 8 (eight) hours as needed for nausea or vomiting.  Dispense: 20 tablet; Refill: 0 ? ? ? ?Meds ordered this encounter  ?Medications  ? ondansetron (ZOFRAN-ODT) 4 MG disintegrating tablet  ?  Sig: Take 1 tablet (4 mg total) by mouth every 8 (eight) hours as needed for nausea or vomiting.  ?  Dispense:  20 tablet  ?  Refill:  0  ?  Order Specific Question:   Supervising Provider  ?  Answer:   Carlota Raspberry, JEFFREY R [[id number]]  ? ? ?Return if symptoms worsen or fail to improve. ? ?PLAN ?Suspect viral gastroenteritis. Should be self limited. Supportive care with hydration and  bland foods ?Will give zofran for control of nausea. ?Return if worsening or failing to improve. ?Patient encouraged to call clinic with any questions, comments, or concerns. ? ?Maximiano Coss, NP ?

## 2022-04-12 ENCOUNTER — Ambulatory Visit (HOSPITAL_COMMUNITY): Payer: Medicare Other | Attending: Neurosurgery

## 2022-04-12 DIAGNOSIS — R29898 Other symptoms and signs involving the musculoskeletal system: Secondary | ICD-10-CM | POA: Insufficient documentation

## 2022-04-12 DIAGNOSIS — M546 Pain in thoracic spine: Secondary | ICD-10-CM | POA: Insufficient documentation

## 2022-04-12 DIAGNOSIS — M545 Low back pain, unspecified: Secondary | ICD-10-CM | POA: Diagnosis present

## 2022-04-12 NOTE — Therapy (Addendum)
OUTPATIENT PHYSICAL THERAPY THORACOLUMBAR EVALUATION   Patient Name: Sarah [name] MRN: [id number] DOB:1946/03/23, 76 y.o., female Today's Date: 04/12/2022   PT End of Session - 04/12/22 0946     Visit Number 1    Number of Visits 3    Authorization Type [employer] Medicare, no auth, no copay, no VL    Authorization Time Period 12/16/21 to present    Progress Note Due on Visit 3    PT Start Time 0938    PT Stop Time 1030    PT Time Calculation (min) 52 min             Past Medical History:  Diagnosis Date   Allergy    Arthritis    Asthma    Cancer (HCC)    basal cell cancer--nose   Cataract    left eye and stable    Cervical disc disease    bulging   Diverticulitis    Diverticulosis    GERD (gastroesophageal reflux disease)    Hyperlipidemia    Hypertension    Osteopenia    Osteoporosis    PONV (postoperative nausea and vomiting)    Status post dilation of esophageal narrowing    Past Surgical History:  Procedure Laterality Date   ABDOMINAL HYSTERECTOMY  1995   TVH--still has ovaries   ABDOMINAL SACROCOLPOPEXY  2007   Halban culdoplasty & Burch procedure--Dr. Edward Jolly   APPENDECTOMY     Dr. Daphine Deutscher   arthroscopic shoulder Left    Dr. Cleophas Dunker, bone spur   BLADDER SUSPENSION     CERVICAL FUSION     C-4, C-5 fusion   CHOLECYSTECTOMY     Dr. Lebron Conners   ESOPHAGEAL DILATION     HIP ARTHROPLASTY Left 11/07/2013   Procedure: ARTHROPLASTY BIPOLAR HIP;  Surgeon: [name], MD;  Location: [facility] OR;  Service: Orthopedics;  Laterality: Left;   KNEE ARTHROSCOPY Bilateral    Dr. Cleophas Dunker   OTHER SURGICAL HISTORY  2011   posterior Colporrhaphy with Xenform biological graft--Dr. Sissy Hoff REPAIR  2011   posterior colporrhaphy with Xenform biological graft--Dr. Edward Jolly   UPPER GASTROINTESTINAL ENDOSCOPY     Patient Active Problem List   Diagnosis Date Noted   Pain in left knee 03/06/2021   Allergic rhinitis 02/20/2021   Bee allergy status 02/20/2021    Gastro-esophageal reflux disease without esophagitis 02/20/2021   Rash and other nonspecific skin eruption 02/20/2021   Seafood allergy 02/20/2021   Toxic effect of venom of bees, accidental (unintentional), subsequent encounter 02/20/2021   Pain in left leg 10/11/2020   Diverticulitis 07/09/2017   Primary osteoarthritis of both hands 12/23/2016   Primary osteoarthritis of both knees 12/23/2016   Postural kyphosis of thoracic region 12/23/2016   DJD (degenerative joint disease), cervical 12/23/2016   History of gastroesophageal reflux (GERD) 12/23/2016   History of fracture of left hip 12/23/2016   Physical exam 11/04/2016   Environmental and seasonal allergies 06/27/2016   Esophageal dysphagia 06/03/2016   Osteoporosis 08/03/2014   Hyperlipidemia 11/06/2013   Hypertension 11/06/2013    PCP: Sheliah Hatch  REFERRING PROVIDER: Lisbeth Renshaw, MD  REFERRING DIAG:  PT eval/tx for M54.9 dorsalgia and dry needling per Lisbeth Renshaw, MD    THERAPY DIAG:  Low back pain, unspecified back pain laterality, unspecified chronicity, unspecified whether sciatica present  Other symptoms and signs involving the musculoskeletal system  Pain in thoracic spine  ONSET DATE: 2-3 years ago  SUBJECTIVE:  SUBJECTIVE STATEMENT: Patient reports intermittent pain that started 2-3 years ago.  She only has pain once a month or so.  Mid back  "bra strap" area and radiates around to the front of her trunk; seems to be fairly equal on each side.  No injury or fall that she is aware of. She is not interested in dry needling.   PERTINENT HISTORY:  Wearing a neck brace; wears it most of the time. 12+ years. Hx of cervical fusion per Dr. Jeral Fruit anterior approach.  Left hip fx 6 years ago hemi arthroplasty,  osteoporisis  PAIN:  Are you having pain? Yes: NPRS scale: 1/10 Pain location: mid back/ thoracic spine Pain description: tender Aggravating factors: standing in slightly flexed position for prolonged period of time Relieving factors: pressure   PRECAUTIONS: None  WEIGHT BEARING RESTRICTIONS No  FALLS:  Has patient fallen in last 6 months? No  LIVING ENVIRONMENT: Lives with: lives with their spouse Lives in: House/apartment Stairs: Yes: External: 4 steps; can reach both Has following equipment at home: Single point cane and Grab bars  OCCUPATION: retired  PLOF: Independent  PATIENT GOALS get stronger postural muscles   OBJECTIVE:   DIAGNOSTIC FINDINGS:  none  PATIENT SURVEYS:  FOTO 73    COGNITION:  Overall cognitive status: Within functional limits for tasks assessed     SENSATION: WFL  POSTURE:  Wearing neck collar  PALPATION: Mild tenderness Thoracic spine spinous processes; paraspinals  LUMBAR ROM:   Active  A/PROM  04/12/2022 % available  Thoracic extension 50%  Flexion wnl  Extension 80%   Right lateral flexion 80% * slight discomfort  Left lateral flexion 80%  Right rotation wfl  Left rotation wfl   (Blank rows = not tested)  LE ROM:  Active  Right 04/12/2022 Left 04/12/2022  Hip flexion ~70 deg ~85 deg  Hip extension    Hip abduction    Hip adduction    Hip internal rotation    Hip external rotation    Knee flexion    Knee extension    Ankle dorsiflexion    Ankle plantarflexion    Ankle inversion    Ankle eversion     (Blank rows = not tested)  LE MMT:  MMT Right 04/12/2022 Left 04/12/2022  Hip flexion 4 4-  Hip extension 4 4  Hip abduction 4+ 4+  Hip adduction    Hip internal rotation    Hip external rotation    Knee flexion (sitting) 4+ 4+  Knee extension 5 4  Ankle dorsiflexion 5 4+  Ankle plantarflexion    Ankle inversion    Ankle eversion     (Blank rows = not tested)  Bilat UE MMTs grossly 4+/5 to 5/5  throughout; no pain.     FUNCTIONAL TESTS:  5 times sit to stand: 14 sec Timed up and go (TUG): 11 sec, 9 sec  GAIT: Distance walked: 80 Assistive device utilized: None Level of assistance: Complete Independence Comments: gait wnl    TODAY'S TREATMENT  Physical therapy evaluation, HEP instruction, postural education   PATIENT EDUCATION:  Education details: HEP instruction, postural education Person educated: Patient Education method: Explanation, Demonstration, and Handouts Education comprehension: verbalized understanding, returned demonstration, and needs further education   HOME EXERCISE PROGRAM: Access Code: [email or web address] URL: [email or web address] Date: 04/12/2022 Prepared by: [facility] - Rehab  Exercises - Seated Thoracic Lumbar Extension  - 2 x daily - 7 x weekly - 1 sets - 10 reps - Seated Scapular Retraction  - 2  x daily - 7 x weekly - 1 sets - 10 reps - Seated Shoulder W  - 2 x daily - 7 x weekly - 1 sets - 10 reps - Wall Push Up  - 2 x daily - 7 x weekly - 1 sets - 10 reps - Standing Shoulder Horizontal Abduction with Resistance  - 2 x daily - 7 x weekly - 1 sets - 10 reps - Standing Shoulder Row with Anchored Resistance  - 2 x daily - 7 x weekly - 1 sets - 10 reps  ASSESSMENT:  CLINICAL IMPRESSION: Patient is a 76 y.o. female who was seen today for physical therapy evaluation and treatment for back pain. She is not currently having pain only mild tenderness.  Patient demonstrates mild to moderate deficits with lumbar and thoracic mobility and noted lower extremity weakness that negatively impact her activities of daily living in her home and community; limit her ability especially for prolonged standing to cook a meal or assist in the fellowship hall of her church and for bending to pick up items off the floor. Patient will benefit from skilled PT interventions to address back pain, decreased lumbar and thoracic mobility and core weakness, LE weakness,  and  the instruction, development and modification of HEP   OBJECTIVE IMPAIRMENTS decreased activity tolerance, decreased endurance, decreased mobility, decreased ROM, decreased strength, hypomobility, impaired flexibility, and pain.   ACTIVITY LIMITATIONS cleaning, community activity, meal prep, yard work, and shopping.   PERSONAL FACTORS Age are also affecting patient's functional outcome.    REHAB POTENTIAL: Good  CLINICAL DECISION MAKING: Stable/uncomplicated  EVALUATION COMPLEXITY: Low   GOALS: Goals reviewed with patient? No  LONG TERM GOALS: Target date: 05/03/2022   Patient will be independent with advanced HEP and self management strategies to improve quality of life and functional outcomes. Goal status: INITIAL  2.  Patient will increase bilateral lower extremity MMTs to 4+-5/5 to promote return to ambulation community distances with minimal deviation.   Baseline:  MMT Right 04/12/2022 Left 04/12/2022  Hip flexion 4 4-  Hip extension 4 4  Hip abduction 4+ 4+  Hip adduction    Hip internal rotation    Hip external rotation    Knee flexion (sitting) 4+ 4+  Knee extension 5 4  Ankle dorsiflexion 5 4+  Ankle plantarflexion    Ankle inversion    Ankle eversion     Goal status: INITIAL  3.  Patient will be able to demonstrate pain free lumbar motion in in 6/6 of the motions/directions (flexion/extension/SB R/SB L/ ROT R/ ROT L)  Baseline: painful with Right Sidebending Goal status: INITIAL  4.  Patient will be able to stand x 30 min to cook a meal without back pain to allow her to help serve meals in the fellowship hall of her church. Baseline:  Goal status: INITIAL  5.   Patient will meet predicted FOTO score to demonstrate improved overall function Baseline: 73 Goal status: INITIAL  PLAN: PT FREQUENCY: 1x/week  PT DURATION: 2 weeks  PLANNED INTERVENTIONS: Therapeutic exercises, Therapeutic activity, Neuromuscular re-education, Balance training, Gait  training, Patient/Family education, Joint manipulation, Joint mobilization, Stair training, Vestibular training, Orthotic/Fit training, DME instructions, Aquatic Therapy, Dry Needling, Cognitive remediation, Electrical stimulation, Spinal manipulation, Spinal mobilization, Taping, Traction, Ultrasound, Biofeedback, and Manual therapy.  PLAN FOR NEXT SESSION: Review goals and HEP; progress HEP thoracic and lumbar stabilization exercises   10:52 AM, 04/12/22 Carrieann Spielberg Small Mcguire Gasparyan MPT [facility] [place] [phone] Ph:[phone]

## 2022-04-24 ENCOUNTER — Other Ambulatory Visit: Payer: Self-pay

## 2022-04-24 DIAGNOSIS — I10 Essential (primary) hypertension: Secondary | ICD-10-CM

## 2022-04-24 MED ORDER — VALSARTAN 80 MG PO TABS: ORAL_TABLET | ORAL | 1 refills | Status: DC

## 2022-05-07 NOTE — Progress Notes (Unsigned)
76 y.o. G18P1010 Married Caucasian female here for annual breast and pelvic exam.    PCP:  Annye Asa, MD   Patient's last menstrual period was 12/16/1993 (approximate).           Sexually active: {yes no:314532}  The current method of family planning is status post hysterectomy.    Exercising: {yes no:314532}  {types:19826} Smoker:  no  Health Maintenance: Pap:   06-03-11 Neg History of abnormal Pap:  no MMG:  03-11-22 Neg/BiRads1 Colonoscopy:  11-16-21 multiple diverticula;otherwise normal BMD: ***2016  Result :Osteoporosis Lt.hip -2.8--declines treatment. Did not tolerate Evista TDaP:  PCP Gardasil:   n/a HIV: no Hep C: 05-06-17 Neg Screening Labs:  Hb today: ***, Urine today: ***   reports that she has never smoked. She has never used smokeless tobacco. She reports current alcohol use of about 2.0 standard drinks per week. She reports that she does not use drugs.  Past Medical History:  Diagnosis Date   Allergy    Arthritis    Asthma    Cancer ([place])    basal cell cancer--nose   Cataract    left eye and stable    Cervical disc disease    bulging   Diverticulitis    Diverticulosis    GERD (gastroesophageal reflux disease)    Hyperlipidemia    Hypertension    Osteopenia    Osteoporosis    PONV (postoperative nausea and vomiting)    Status post dilation of esophageal narrowing     Past Surgical History:  Procedure Laterality Date   ABDOMINAL HYSTERECTOMY  1995   TVH--still has ovaries   ABDOMINAL SACROCOLPOPEXY  2007   Halban culdoplasty & Burch procedure--Dr. Quincy Simmonds   APPENDECTOMY     Dr. Hassell Done   arthroscopic shoulder Left    Dr. Durward Fortes, bone spur   BLADDER SUSPENSION     CERVICAL FUSION     C-4, C-5 fusion   CHOLECYSTECTOMY     Dr. Georgina Quint   ESOPHAGEAL DILATION     HIP ARTHROPLASTY Left 11/07/2013   Procedure: ARTHROPLASTY BIPOLAR HIP;  Surgeon: Garald Balding, MD;  Location: [facility];  Service: Orthopedics;  Laterality: Left;   KNEE  ARTHROSCOPY Bilateral    Dr. Durward Fortes   OTHER SURGICAL HISTORY  2011   posterior Colporrhaphy with Xenform biological graft--Dr. Allyn Kenner REPAIR  2011   posterior colporrhaphy with Xenform biological graft--Dr. Quincy Simmonds   UPPER GASTROINTESTINAL ENDOSCOPY      Current Outpatient Medications  Medication Sig Dispense Refill   Ascorbic Acid (VITAMIN C PO) Take by mouth.     atorvastatin (LIPITOR) 10 MG tablet TAKE 1 TAKE BY MOUTH EVERY 3 DAYS 90 tablet 1   beta carotene w/minerals (OCUVITE) tablet Take 1 tablet by mouth daily.     Calcium Citrate (CITRACAL PO) Take 2 tablets by mouth daily.     cetirizine (ZYRTEC) 10 MG tablet Take 1 tablet (10 mg total) by mouth daily. 30 tablet 11   clobetasol ointment (TEMOVATE) 0.05 % Apply topically 2 (two) times daily as needed. Use for 2 weeks at time as needed.  Use at bedtime twice a week as needed for a maintenance dose. 30 g 1   Coenzyme Q10 (COQ10) 200 MG CAPS Take 1 tablet by mouth daily.     EPIPEN 2-PAK 0.3 MG/0.3ML SOAJ injection Inject 1 mg into the muscle as needed. Reported on 06/28/2016     Ergocalciferol (VITAMIN D2) 400 units TABS Take 1 capsule by mouth daily.  esomeprazole (NEXIUM) 40 MG capsule TAKE 1 CAPSULE(40 MG) BY MOUTH DAILY 90 capsule 1   fish oil-omega-3 fatty acids 1000 MG capsule Take 2 g by mouth daily.     fluticasone (FLONASE) 50 MCG/ACT nasal spray Place 2 sprays into both nostrils daily.     lactobacillus acidophilus (BACID) TABS tablet Take 2 tablets by mouth 2 (two) times a week.     Multiple Vitamin (MULTIVITAMIN) capsule Take 1 capsule by mouth daily.     ondansetron (ZOFRAN-ODT) 4 MG disintegrating tablet Take 1 tablet (4 mg total) by mouth every 8 (eight) hours as needed for nausea or vomiting. 20 tablet 0   PROAIR HFA 108 (90 Base) MCG/ACT inhaler INL 2 PFS PO Q 4 TO 6 H PRN  0   valsartan (DIOVAN) 80 MG tablet Take one tablet daily 90 tablet 1   No current facility-administered medications for this  visit.    Family History  Problem Relation Age of Onset   Hypertension Mother    Diabetes Father    Hypertension Father    Heart disease Father    Diabetes Sister    Hypertension Sister    Schizophrenia Sister    Thyroid disease Sister    Heart disease Sister    Throat cancer Paternal Uncle        smoker   Lung cancer Cousin        smoker   Colon cancer Neg Hx    Pancreatic cancer Neg Hx    Stomach cancer Neg Hx    Liver disease Neg Hx    Colon polyps Neg Hx    Esophageal cancer Neg Hx    Rectal cancer Neg Hx     Review of Systems  Exam:   LMP 12/16/1993 (Approximate)     General appearance: alert, cooperative and appears stated age Head: normocephalic, without obvious abnormality, atraumatic Neck: no adenopathy, supple, symmetrical, trachea midline and thyroid normal to inspection and palpation Lungs: clear to auscultation bilaterally Breasts: normal appearance, no masses or tenderness, No nipple retraction or dimpling, No nipple discharge or bleeding, No axillary adenopathy Heart: regular rate and rhythm Abdomen: soft, non-tender; no masses, no organomegaly Extremities: extremities normal, atraumatic, no cyanosis or edema Skin: skin color, texture, turgor normal. No rashes or lesions Lymph nodes: cervical, supraclavicular, and axillary nodes normal. Neurologic: grossly normal  Pelvic: External genitalia:  no lesions              No abnormal inguinal nodes palpated.              Urethra:  normal appearing urethra with no masses, tenderness or lesions              Bartholins and Skenes: normal                 Vagina: normal appearing vagina with normal color and discharge, no lesions              Cervix: no lesions              Pap taken: {yes no:314532} Bimanual Exam:  Uterus:  normal size, contour, position, consistency, mobility, non-tender              Adnexa: no mass, fullness, tenderness              Rectal exam: {yes no:314532}.  Confirms.               Anus:  normal sphincter tone, no lesions  Chaperone  was present for exam:  ***  Assessment:   Well woman visit with gynecologic exam.   Plan: Mammogram screening discussed. Self breast awareness reviewed. Pap and HR HPV as above. Guidelines for Calcium, Vitamin D, regular exercise program including cardiovascular and weight bearing exercise.   Follow up annually and prn.   Additional counseling given.  {yes [id number]. _______ minutes face to face time of which over 50% was spent in counseling.    After visit summary provided.

## 2022-05-08 ENCOUNTER — Encounter: Payer: Self-pay | Admitting: Family Medicine

## 2022-05-08 ENCOUNTER — Ambulatory Visit (INDEPENDENT_AMBULATORY_CARE_PROVIDER_SITE_OTHER): Payer: Medicare Other | Admitting: Family Medicine

## 2022-05-08 VITALS — BP 128/74 | HR 63 | Temp 97.9°F | Resp 16

## 2022-05-08 DIAGNOSIS — M79671 Pain in right foot: Secondary | ICD-10-CM

## 2022-05-08 NOTE — Progress Notes (Signed)
   Subjective:    Patient ID: Sarah [name], female    DOB: [birth year], 76 y.o.   MRN: [id number]  HPI R foot pain- pt was walking on the driveway this morning when she rolled her foot off the edge.  Pt reports hearing a 'crunch' at time of injury and again this afternoon when she took a misstep.  Pain is localized to 5th metatarsal.  No pain of ankle.  Pt is not able to touch the area or bear weight.  Has localized swelling and bruising.     Review of Systems For ROS see HPI     Objective:   Physical Exam Vitals reviewed.  Constitutional:      General: She is not in acute distress.    Appearance: Normal appearance. She is not ill-appearing.  Cardiovascular:     Pulses: Normal pulses.  Musculoskeletal:        General: Swelling (along R 5th metatarsal) and tenderness (over 5th metatarsal) present.  Skin:    Findings: Bruising (along distal R 5th metatarsal, just below the head) present.  Neurological:     General: No focal deficit present.     Mental Status: She is alert and oriented to person, place, and time.     Gait: Gait abnormal (unable to bear weight on R foot).          Assessment & Plan:  R foot pain- new.  Given that pt is not able to bear weight, is very point TTP over 5th metatarsal just below the head, and has localized swelling and bruising I am worried for a fracture.  She is not very good using the crutches she brought with her today and is currently sitting in a wheelchair.  Given her increased risk of falls on crutches and the possibility of a 5th metatarsal fx, pt will go directly to [facility] from here.  Pt and husband expressed understanding and agreement.

## 2022-05-08 NOTE — Patient Instructions (Signed)
Go to [facility] at [address] They take walk-ins and have their own xray ICE!!  Elevate!!!  REST!! Call with any questions or concerns Hang in there!!!

## 2022-05-09 ENCOUNTER — Encounter: Payer: Self-pay | Admitting: Family Medicine

## 2022-05-09 ENCOUNTER — Ambulatory Visit: Payer: Medicare Other | Admitting: Obstetrics and Gynecology

## 2022-05-30 ENCOUNTER — Other Ambulatory Visit: Payer: Self-pay

## 2022-05-30 DIAGNOSIS — E785 Hyperlipidemia, unspecified: Secondary | ICD-10-CM

## 2022-05-30 MED ORDER — ATORVASTATIN CALCIUM 10 MG PO TABS: ORAL_TABLET | ORAL | 1 refills | Status: DC

## 2022-06-28 ENCOUNTER — Encounter: Payer: Self-pay | Admitting: Obstetrics and Gynecology

## 2022-06-28 ENCOUNTER — Ambulatory Visit (INDEPENDENT_AMBULATORY_CARE_PROVIDER_SITE_OTHER): Payer: Medicare Other | Admitting: Obstetrics and Gynecology

## 2022-06-28 VITALS — BP 118/80 | HR 74 | Ht 64.0 in | Wt 149.0 lb

## 2022-06-28 DIAGNOSIS — L900 Lichen sclerosus et atrophicus: Secondary | ICD-10-CM

## 2022-06-28 DIAGNOSIS — Z5181 Encounter for therapeutic drug level monitoring: Secondary | ICD-10-CM | POA: Diagnosis not present

## 2022-06-28 DIAGNOSIS — Z01419 Encounter for gynecological examination (general) (routine) without abnormal findings: Secondary | ICD-10-CM

## 2022-06-28 DIAGNOSIS — M810 Age-related osteoporosis without current pathological fracture: Secondary | ICD-10-CM | POA: Diagnosis not present

## 2022-06-28 MED ORDER — CLOBETASOL PROPIONATE 0.05 % EX OINT: TOPICAL_OINTMENT | Freq: Two times a day (BID) | CUTANEOUS | 1 refills | Status: DC | PRN

## 2022-06-28 NOTE — Progress Notes (Signed)
76 y.o. G29P1010 Married Caucasian female here for breast and pelvic exam.   Patient is followed for lichen sclerosus, pelvic organ prolapse, and osteoporosis.  Uses Clobetasol for several days over a month.   Fractured metatarsal.  Foot rolled.   Tool Evista in the past and felt very weak.  No problems with bladder or bowel function or control.   Sister passed.  Husband has had health issues.  Just went on a camping trip.  PCP:   Annye Asa, MD  Patient's last menstrual period was 12/16/1993 (approximate).           Sexually active: Yes.    The current method of family planning is none.    Exercising: No.     Smoker:  no  Health Maintenance: Pap:  06/03/2011 NEG History of abnormal Pap:  no MMG:  03/11/2022 BI-RADS CATEGORY1 Negative. Colonoscopy:  11/16/2021 BMD:   08/2015  Result  Osteoporosis Lt.hip -2.8--declines evaluation and treatment TDaP:  PCP Gardasil:   no HIV Never Hep C:05/06/17 Screening Labs:  PCP   reports that she has never smoked. She has never used smokeless tobacco. She reports current alcohol use of about 2.0 standard drinks of alcohol per week. She reports that she does not use drugs.  Past Medical History:  Diagnosis Date   Allergy    Arthritis    Asthma    Cancer ([place])    basal cell cancer--nose   Cataract    left eye and stable    Cervical disc disease    bulging   Diverticulitis    Diverticulosis    GERD (gastroesophageal reflux disease)    Hyperlipidemia    Hypertension    Osteopenia    Osteoporosis    PONV (postoperative nausea and vomiting)    Status post dilation of esophageal narrowing     Past Surgical History:  Procedure Laterality Date   ABDOMINAL HYSTERECTOMY  1995   TVH--still has ovaries   ABDOMINAL SACROCOLPOPEXY  2007   Halban culdoplasty & Burch procedure--Dr. Quincy Simmonds   APPENDECTOMY     Dr. Hassell Done   arthroscopic shoulder Left    Dr. Durward Fortes, bone spur   BLADDER SUSPENSION     CERVICAL FUSION     C-4,  C-5 fusion   CHOLECYSTECTOMY     Dr. Georgina Quint   ESOPHAGEAL DILATION     HIP ARTHROPLASTY Left 11/07/2013   Procedure: ARTHROPLASTY BIPOLAR HIP;  Surgeon: Garald Balding, MD;  Location: [facility];  Service: Orthopedics;  Laterality: Left;   KNEE ARTHROSCOPY Bilateral    Dr. Durward Fortes   OTHER SURGICAL HISTORY  2011   posterior Colporrhaphy with Xenform biological graft--Dr. Allyn Kenner REPAIR  2011   posterior colporrhaphy with Xenform biological graft--Dr. Quincy Simmonds   UPPER GASTROINTESTINAL ENDOSCOPY      Current Outpatient Medications  Medication Sig Dispense Refill   Ascorbic Acid (VITAMIN C PO) Take by mouth.     atorvastatin (LIPITOR) 10 MG tablet TAKE 1 TAKE BY MOUTH EVERY 3 DAYS 90 tablet 1   beta carotene w/minerals (OCUVITE) tablet Take 1 tablet by mouth daily.     Calcium Citrate (CITRACAL PO) Take 2 tablets by mouth daily.     cetirizine (ZYRTEC) 10 MG tablet Take 1 tablet (10 mg total) by mouth daily. 30 tablet 11   clobetasol ointment (TEMOVATE) 0.05 % Apply topically 2 (two) times daily as needed. Use for 2 weeks at time as needed.  Use at bedtime twice a week as needed  for a maintenance dose. 30 g 1   Coenzyme Q10 (COQ10) 200 MG CAPS Take 1 tablet by mouth daily.     cyclobenzaprine (FLEXERIL) 5 MG tablet Take 5 mg by mouth daily as needed.     EPIPEN 2-PAK 0.3 MG/0.3ML SOAJ injection Inject 1 mg into the muscle as needed. Reported on 06/28/2016     Ergocalciferol (VITAMIN D2) 400 units TABS Take 1 capsule by mouth daily.     esomeprazole (NEXIUM) 40 MG capsule TAKE 1 CAPSULE(40 MG) BY MOUTH DAILY 90 capsule 1   fish oil-omega-3 fatty acids 1000 MG capsule Take 2 g by mouth daily.     fluticasone (FLONASE) 50 MCG/ACT nasal spray Place 2 sprays into both nostrils daily.     lactobacillus acidophilus (BACID) TABS tablet Take 2 tablets by mouth 2 (two) times a week.     Multiple Vitamin (MULTIVITAMIN) capsule Take 1 capsule by mouth daily.     ondansetron (ZOFRAN-ODT)  4 MG disintegrating tablet Take 1 tablet (4 mg total) by mouth every 8 (eight) hours as needed for nausea or vomiting. 20 tablet 0   PROAIR HFA 108 (90 Base) MCG/ACT inhaler INL 2 PFS PO Q 4 TO 6 H PRN  0   valsartan (DIOVAN) 80 MG tablet Take one tablet daily 90 tablet 1   No current facility-administered medications for this visit.    Family History  Problem Relation Age of Onset   Hypertension Mother    Diabetes Father    Hypertension Father    Heart disease Father    Diabetes Sister    Hypertension Sister    Schizophrenia Sister    Thyroid disease Sister    Heart disease Sister    Throat cancer Paternal Uncle        smoker   Lung cancer Cousin        smoker   Colon cancer Neg Hx    Pancreatic cancer Neg Hx    Stomach cancer Neg Hx    Liver disease Neg Hx    Colon polyps Neg Hx    Esophageal cancer Neg Hx    Rectal cancer Neg Hx     Review of Systems  All other systems reviewed and are negative.   Exam:   BP 118/80 (BP Location: Right Arm, Patient Position: Sitting, Cuff Size: Normal)   Pulse 74   Ht '5\' 4"'$  (1.626 m)   Wt 149 lb (67.6 kg)   LMP 12/16/1993 (Approximate)   BMI 25.58 kg/m     General appearance: alert, cooperative and appears stated age Head: normocephalic, without obvious abnormality, atraumatic Neck: no adenopathy, supple, symmetrical, trachea midline and thyroid normal to inspection and palpation Lungs: clear to auscultation bilaterally Breasts: normal appearance, no masses or tenderness, No nipple retraction or dimpling, No nipple discharge or bleeding, No axillary adenopathy Heart: regular rate and rhythm Abdomen: soft, non-tender; no masses, no organomegaly Extremities: extremities normal, atraumatic, no cyanosis or edema Skin: skin color, texture, turgor normal. No rashes or lesions Lymph nodes: cervical, supraclavicular, and axillary nodes normal. Neurologic: grossly normal  Pelvic: External genitalia:  whitish skin color change of the  perineum. Subtle hypopigmentation of the superior labia minora.               No abnormal inguinal nodes palpated.              Urethra:  normal appearing urethra with no masses, tenderness or lesions  Bartholins and Skenes: normal                 Vagina: normal appearing vagina with normal color and discharge, no lesions.  First degree cystocele.               Cervix:  absent              Pap taken: no Bimanual Exam:  Uterus:  absent              Adnexa: no mass, fullness, tenderness              Rectal exam: yes.  Confirms.              Anus:  normal sphincter tone, no lesions  Chaperone was present for exam:  Kimalexis, CMA  Assessment:   Well woman visit with gynecologic exam. Status post total vaginal hysterectomy.  Ovaries remain. Status post sacrocolpopexy, Halban culdoplasty and Burch.  Status post rectocele repair with biologic graft.  Cystocele. Osteoporosis.  Current fracture.  Has declined tx.  Intolerant of Evista.  Lichen sclerosus.  Controlled with Clobetasol. Encounter for medication monitoring.   Plan: Mammogram screening discussed. Self breast awareness reviewed. Pap and HR HPV not indicated. Guidelines for Calcium, Vitamin D, regular exercise program including cardiovascular and weight bearing exercise. Rx for lichen sclerosus.  Patient will let me know if she wishes to do a bone density and consultation with endocrinology regarding osteoporosis treatment options.  I did discuss Prolia, which patient declines due to inability to quickly reverse the medication.  Follow up annually and prn.   After visit summary provided.   25 min  total time was spent for this patient encounter, including preparation, face-to-face counseling with the patient, coordination of care, and documentation of the encounter.

## 2022-06-28 NOTE — Patient Instructions (Signed)

## 2022-07-08 ENCOUNTER — Other Ambulatory Visit: Payer: Self-pay

## 2022-07-08 MED ORDER — ESOMEPRAZOLE MAGNESIUM 40 MG PO CPDR: DELAYED_RELEASE_CAPSULE | ORAL | 1 refills | Status: DC

## 2022-07-12 ENCOUNTER — Encounter: Payer: Self-pay | Admitting: Family Medicine

## 2022-07-25 ENCOUNTER — Encounter: Payer: Self-pay | Admitting: Family Medicine

## 2022-08-09 ENCOUNTER — Ambulatory Visit (INDEPENDENT_AMBULATORY_CARE_PROVIDER_SITE_OTHER): Payer: Medicare Other | Admitting: Family Medicine

## 2022-08-09 ENCOUNTER — Encounter: Payer: Self-pay | Admitting: Family Medicine

## 2022-08-09 VITALS — BP 110/64 | HR 86 | Temp 98.1°F | Resp 16 | Ht 63.0 in | Wt 146.1 lb

## 2022-08-09 DIAGNOSIS — Z23 Encounter for immunization: Secondary | ICD-10-CM | POA: Diagnosis not present

## 2022-08-09 DIAGNOSIS — M810 Age-related osteoporosis without current pathological fracture: Secondary | ICD-10-CM

## 2022-08-09 DIAGNOSIS — I10 Essential (primary) hypertension: Secondary | ICD-10-CM

## 2022-08-09 DIAGNOSIS — Z0000 Encounter for general adult medical examination without abnormal findings: Secondary | ICD-10-CM | POA: Diagnosis not present

## 2022-08-09 LAB — CBC WITH DIFFERENTIAL/PLATELET
Basophils Absolute: 0 10*3/uL (ref 0.0–0.1)
Basophils Relative: 0.7 % (ref 0.0–3.0)
Eosinophils Absolute: 0.2 10*3/uL (ref 0.0–0.7)
Eosinophils Relative: 4.4 % (ref 0.0–5.0)
HCT: 39.1 % (ref 36.0–46.0)
Hemoglobin: 13.2 g/dL (ref 12.0–15.0)
Lymphocytes Relative: 32.8 % (ref 12.0–46.0)
Lymphs Abs: 1.1 10*3/uL (ref 0.7–4.0)
MCHC: 33.8 g/dL (ref 30.0–36.0)
MCV: 89.2 fl (ref 78.0–100.0)
Monocytes Absolute: 0.3 10*3/uL (ref 0.1–1.0)
Monocytes Relative: 8.1 % (ref 3.0–12.0)
Neutro Abs: 1.9 10*3/uL (ref 1.4–7.7)
Neutrophils Relative %: 54 % (ref 43.0–77.0)
Platelets: 207 10*3/uL (ref 150.0–400.0)
RBC: 4.38 Mil/uL (ref 3.87–5.11)
RDW: 13.7 % (ref 11.5–15.5)
WBC: 3.5 10*3/uL — ABNORMAL LOW (ref 4.0–10.5)

## 2022-08-09 LAB — BASIC METABOLIC PANEL
BUN: 18 mg/dL (ref 6–23)
CO2: 29 mEq/L (ref 19–32)
Calcium: 9.3 mg/dL (ref 8.4–10.5)
Chloride: 106 mEq/L (ref 96–112)
Creatinine, Ser: 0.92 mg/dL (ref 0.40–1.20)
GFR: 60.5 mL/min (ref >60.00)
Glucose, Bld: 64 mg/dL — ABNORMAL LOW (ref 70–99)
Potassium: 4.1 mEq/L (ref 3.5–5.1)
Sodium: 139 mEq/L (ref 135–145)

## 2022-08-09 LAB — HEPATIC FUNCTION PANEL
ALT: 13 U/L (ref 0–35)
AST: 18 U/L (ref 0–37)
Albumin: 4.4 g/dL (ref 3.5–5.2)
Alkaline Phosphatase: 56 U/L (ref 39–117)
Bilirubin, Direct: 0 mg/dL (ref 0.0–0.3)
Total Bilirubin: 0.5 mg/dL (ref 0.2–1.2)
Total Protein: 7.1 g/dL (ref 6.0–8.3)

## 2022-08-09 LAB — TSH: TSH: 1.36 u[IU]/mL (ref 0.35–5.50)

## 2022-08-09 LAB — LIPID PANEL
Cholesterol: 199 mg/dL (ref 0–200)
HDL: 70 mg/dL (ref >39.00)
LDL Cholesterol: 109 mg/dL — ABNORMAL HIGH (ref 0–99)
NonHDL: 128.51
Total CHOL/HDL Ratio: 3
Triglycerides: 97 mg/dL (ref 0.0–149.0)
VLDL: 19.4 mg/dL (ref 0.0–40.0)

## 2022-08-09 LAB — VITAMIN D 25 HYDROXY (VIT D DEFICIENCY, FRACTURES): VITD: 87 ng/mL (ref 30.00–100.00)

## 2022-08-09 NOTE — Progress Notes (Signed)
   Subjective:    Patient ID: Sarah [name], female    DOB: 04/13/1946, 76 y.o.   MRN: [id number]  HPI CPE- UTD on mammo, colonoscopy, PNA vaccines.  Will get flu shot today  Patient Care Team    Relationship Specialty Notifications Start End  Midge Minium, MD PCP - General Family Medicine  04/08/16   Milus Banister, MD Attending Physician Gastroenterology  06/27/16   Harold Hedge, Darrick Grinder, MD Consulting Physician Allergy and Immunology  06/27/16   Nunzio Cobbs, MD Consulting Physician Obstetrics and Gynecology  06/27/16   Consuella Lose, MD Consulting Physician Neurosurgery  06/27/16   Otelia Sergeant, OD Consulting Physician   06/27/16   Jamesetta Geralds, [place]  Chiropractic Medicine  11/12/17   [facility], [facility] Surgery    11/12/17     Health Maintenance  Topic Date Due   INFLUENZA VACCINE  07/16/2022   Pneumonia Vaccine 34+ Years old  Completed   DEXA SCAN  Completed   Hepatitis C Screening  Completed   Zoster Vaccines- Shingrix  Completed   HPV VACCINES  Aged Out   TETANUS/TDAP  Discontinued   COVID-19 Vaccine  Discontinued   Fecal DNA (Cologuard)  Discontinued      Review of Systems Patient reports no vision/ hearing changes, adenopathy,fever, weight change,  persistant/recurrent hoarseness , swallowing issues, chest pain, palpitations, edema, persistant/recurrent cough, hemoptysis, dyspnea (rest/exertional/paroxysmal nocturnal), gastrointestinal bleeding (melena, rectal bleeding), abdominal pain, significant heartburn, bowel changes, GU symptoms (dysuria, hematuria, incontinence), Gyn symptoms (abnormal  bleeding, pain),  syncope, focal weakness, memory loss, numbness & tingling, skin/hair/nail changes, abnormal bruising or bleeding, anxiety, or depression.     Objective:   Physical Exam General Appearance:    Alert, cooperative, no distress, appears stated age  Head:    Normocephalic, without obvious abnormality, atraumatic  Eyes:    PERRL, conjunctiva/corneas  clear, EOM's intact both eyes  Ears:    Normal TM's and external ear canals, both ears  Nose:   Nares normal, septum midline, mucosa normal, no drainage    or sinus tenderness  Throat:   Lips, mucosa, and tongue normal; teeth and gums normal  Neck:   Supple, symmetrical, trachea midline, no adenopathy;    Thyroid: no enlargement/tenderness/nodules  Back:     Symmetric, no curvature, ROM normal, no CVA tenderness  Lungs:     Clear to auscultation bilaterally, respirations unlabored  Chest Wall:    No tenderness or deformity   Heart:    Regular rate and rhythm, S1 and S2 normal, no murmur, rub   or gallop  Breast Exam:    Deferred to mammo  Abdomen:     Soft, non-tender, bowel sounds active all four quadrants,    no masses, no organomegaly  Genitalia:    Deferred  Rectal:    Extremities:   Extremities normal, atraumatic, no cyanosis or edema  Pulses:   2+ and symmetric all extremities  Skin:   Skin color, texture, turgor normal, no rashes or lesions  Lymph nodes:   Cervical, supraclavicular, and axillary nodes normal  Neurologic:   CNII-XII intact, normal strength, sensation and reflexes    throughout          Assessment & Plan:

## 2022-08-09 NOTE — Assessment & Plan Note (Signed)
Chronic problem.  Excellent control today.  Asymptomatic.  Check labs due to ARB but no anticipated med changes.

## 2022-08-09 NOTE — Patient Instructions (Addendum)
Follow up in 6 months to recheck BP and cholesterol We'll notify you of your lab results and make any changes if needed Keep up the good work on healthy diet and regular exercise- you look great! Call with any questions or concerns Stay Safe!  Stay healthy! Enjoy the rest of your summer!!

## 2022-08-09 NOTE — Assessment & Plan Note (Signed)
Check labs and replete Vit D prn.  Pt still declines DEXA as she is not interested in tx.

## 2022-08-09 NOTE — Assessment & Plan Note (Signed)
Pt's PE WNL.  UTD on mammo, colonoscopy, PNA vaccines.  Flu shot given.  Check labs.  Anticipatory guidance provided.

## 2022-09-18 ENCOUNTER — Encounter: Payer: Self-pay | Admitting: Family Medicine

## 2022-10-21 ENCOUNTER — Other Ambulatory Visit: Payer: Self-pay

## 2022-10-21 DIAGNOSIS — I10 Essential (primary) hypertension: Secondary | ICD-10-CM

## 2022-10-21 MED ORDER — VALSARTAN 80 MG PO TABS: ORAL_TABLET | ORAL | 1 refills | Status: DC

## 2022-10-22 ENCOUNTER — Encounter: Payer: Self-pay | Admitting: Family Medicine

## 2022-11-05 ENCOUNTER — Encounter: Payer: Self-pay | Admitting: Orthopaedic Surgery

## 2022-11-05 ENCOUNTER — Ambulatory Visit (INDEPENDENT_AMBULATORY_CARE_PROVIDER_SITE_OTHER): Payer: Medicare Other | Admitting: Orthopaedic Surgery

## 2022-11-05 ENCOUNTER — Ambulatory Visit (INDEPENDENT_AMBULATORY_CARE_PROVIDER_SITE_OTHER): Payer: Medicare Other

## 2022-11-05 DIAGNOSIS — M25552 Pain in left hip: Secondary | ICD-10-CM | POA: Diagnosis not present

## 2022-11-05 DIAGNOSIS — M7062 Trochanteric bursitis, left hip: Secondary | ICD-10-CM | POA: Diagnosis not present

## 2022-11-05 MED ORDER — BUPIVACAINE HCL 0.25 % IJ SOLN
2.0000 mL | INTRAMUSCULAR | Status: AC | PRN
  Administered 2022-11-05: 2 mL via INTRA_ARTICULAR

## 2022-11-05 MED ORDER — METHYLPREDNISOLONE ACETATE 40 MG/ML IJ SUSP
80.0000 mg | INTRAMUSCULAR | Status: AC | PRN
  Administered 2022-11-05: 80 mg via INTRA_ARTICULAR

## 2022-11-05 MED ORDER — LIDOCAINE HCL 1 % IJ SOLN
2.0000 mL | INTRAMUSCULAR | Status: AC | PRN
  Administered 2022-11-05: 2 mL

## 2022-11-05 NOTE — Progress Notes (Signed)
Office Visit Note   Patient: Sarah [name]           Date of Birth: 1946-07-28           MRN: [id number] Visit Date: 11/05/2022              Requested by: Midge Minium, MD [address] [place],  [place] [address] PCP: Midge Minium, MD   Assessment & Plan: Visit Diagnoses:  1. Pain of left hip   2. Trochanteric bursitis, left hip     Plan: Sarah [name] is a pleasant 76 year old woman wh 10 years status post left bipolar hip arthroplasty following her left hip fracture.  She has done very well over the years.  8 days ago she was in church and was stepping down a few steps to the left.  She felt a pulling in the lateral side of her left hip.  Since then she has had pain focused slightly over the posterior hip but more over the lateral hip and trochanteric bursa.  She says she does have a history of sciatica but this feels different.  She denies any radiation of her symptoms other than down the lateral side of her left hip.  She denies any paresthesias.  She denies any groin pain.  X-rays demonstrate the bipolar hip to be in good position.  On examination she is exquisitely tender to palpation over the trochanteric bursa.  This reproduces her symptoms.  We will go forward with the trochanteric bursa injection today.  May follow-up as needed  Follow-Up Instructions: Return if symptoms worsen or fail to improve.   Orders:  Orders Placed This Encounter  Procedures   Large Joint Inj: L greater trochanter   XR HIP UNILAT W OR W/O PELVIS 2-3 VIEWS LEFT   No orders of the defined types were placed in this encounter.     Procedures: Large Joint Inj: L greater trochanter on 11/05/2022 3:45 PM Indications: pain and diagnostic evaluation Details: 22 G 1.5 in and 3.5 in needle, lateral approach  Arthrogram: No  Medications: 2 mL lidocaine 1 %; 80 mg methylPREDNISolone acetate 40 MG/ML; 2 mL bupivacaine 0.25 % Outcome: tolerated well, no immediate complications Procedure,  treatment alternatives, risks and benefits explained, specific risks discussed. Consent was given by the patient.      Clinical Data: No additional findings.   Subjective: Chief Complaint  Patient presents with   Left Hip - Pain    HPI Sarah [name] is a pleasant 76 year old woman who is 10 years status post left hip bipolar arthroplasty after a hip fracture.  She is actually done quite well over the years.  About 8 days ago she was sidestepping to the left across a pew at church.  She felt the lateral pulling in her hip.  Since then she has had left hip pain with weightbearing that she describes around the posterior lateral and lateral side of her hip.  She denies any radicular symptoms.  Review of Systems  All other systems reviewed and are negative.    Objective: Vital Signs: LMP 12/16/1993 (Approximate)   Physical Exam Constitutional:      Appearance: Normal appearance.  Pulmonary:     Effort: Pulmonary effort is normal.  Skin:    General: Skin is warm and dry.  Neurological:     General: No focal deficit present.     Mental Status: She is alert.     Ortho Exam Left hip she has a  well-healed surgical incision no redness no erythema.  Her strength is 5 out of 5 no radicular findings.  No pain with internal/external rotation of her hip no reproduction of pain in her groin.  She is acutely tender to palpation over her greater trochanteric bursa Specialty Comments:  No specialty comments available.  Imaging: XR HIP UNILAT W OR W/O PELVIS 2-3 VIEWS LEFT  Result Date: 11/05/2022 2 view radiographs of her left hip were reviewed today.  She is status post post bipolar hip arthroplasty.  Components are in good position without any evidence of loosening no evidence of any periarticular fractures or other osseous abnormalities.  Bipolar sits well in the acetabulum.  No obvious complicating features    PMFS History: Patient Active Problem List   Diagnosis Date Noted    Trochanteric bursitis, left hip 11/05/2022   Pain in left knee 03/06/2021   Allergic rhinitis 02/20/2021   Bee allergy status 02/20/2021   Gastro-esophageal reflux disease without esophagitis 02/20/2021   Rash and other nonspecific skin eruption 02/20/2021   Seafood allergy 02/20/2021   Toxic effect of venom of bees, accidental (unintentional), subsequent encounter 02/20/2021   Pain in left leg 10/11/2020   Diverticulitis 07/09/2017   Primary osteoarthritis of both hands 12/23/2016   Primary osteoarthritis of both knees 12/23/2016   Postural kyphosis of thoracic region 12/23/2016   DJD (degenerative joint disease), cervical 12/23/2016   History of gastroesophageal reflux (GERD) 12/23/2016   History of fracture of left hip 12/23/2016   Physical exam 11/04/2016   Environmental and seasonal allergies 06/27/2016   Esophageal dysphagia 06/03/2016   Osteoporosis 08/03/2014   Hyperlipidemia 11/06/2013   Hypertension 11/06/2013   Past Medical History:  Diagnosis Date   Allergy    Arthritis    Asthma    Cancer ([place])    basal cell cancer--nose   Cataract    left eye and stable    Cervical disc disease    bulging   Diverticulitis    Diverticulosis    GERD (gastroesophageal reflux disease)    Hyperlipidemia    Hypertension    Osteopenia    Osteoporosis    PONV (postoperative nausea and vomiting)    Status post dilation of esophageal narrowing     Family History  Problem Relation Age of Onset   Hypertension Mother    Diabetes Father    Hypertension Father    Heart disease Father    Diabetes Sister    Hypertension Sister    Schizophrenia Sister    Thyroid disease Sister    Heart disease Sister    Throat cancer Paternal Uncle        smoker   Lung cancer Cousin        smoker   Colon cancer Neg Hx    Pancreatic cancer Neg Hx    Stomach cancer Neg Hx    Liver disease Neg Hx    Colon polyps Neg Hx    Esophageal cancer Neg Hx    Rectal cancer Neg Hx     Past Surgical  History:  Procedure Laterality Date   ABDOMINAL HYSTERECTOMY  1995   TVH--still has ovaries   ABDOMINAL SACROCOLPOPEXY  2007   Halban culdoplasty & Burch procedure--Dr. Quincy Simmonds   APPENDECTOMY     Dr. Hassell Done   arthroscopic shoulder Left    Dr. Durward Fortes, bone spur   BLADDER SUSPENSION     CERVICAL FUSION     C-4, C-5 fusion   CHOLECYSTECTOMY     Dr.  Georgina Quint   ESOPHAGEAL DILATION     HIP ARTHROPLASTY Left 11/07/2013   Procedure: ARTHROPLASTY BIPOLAR HIP;  Surgeon: Garald Balding, MD;  Location: [facility];  Service: Orthopedics;  Laterality: Left;   KNEE ARTHROSCOPY Bilateral    Dr. Durward Fortes   OTHER SURGICAL HISTORY  2011   posterior Colporrhaphy with Xenform biological graft--Dr. Allyn Kenner REPAIR  2011   posterior colporrhaphy with Xenform biological graft--Dr. Quincy Simmonds   UPPER GASTROINTESTINAL ENDOSCOPY     Social History   Occupational History   Occupation: retired  Tobacco Use   Smoking status: Never   Smokeless tobacco: Never  Vaping Use   Vaping Use: Never used  Substance and Sexual Activity   Alcohol use: Yes    Alcohol/week: 2.0 standard drinks of alcohol    Types: 2 Glasses of wine per week   Drug use: No   Sexual activity: Yes    Partners: Male    Birth control/protection: Surgical    Comment: TVH--Still has ovaries,declined inuracne questions

## 2022-11-15 HISTORY — PX: SHOULDER ARTHROSCOPY W/ ROTATOR CUFF REPAIR: SHX2400

## 2022-11-27 ENCOUNTER — Emergency Department (HOSPITAL_COMMUNITY)
Admission: EM | Admit: 2022-11-27 | Discharge: 2022-11-28 | Disposition: A | Discharge status: Home / Self Care | Payer: Medicare Other | Attending: Emergency Medicine | Admitting: Emergency Medicine

## 2022-11-27 ENCOUNTER — Emergency Department (HOSPITAL_COMMUNITY): Payer: Medicare Other

## 2022-11-27 ENCOUNTER — Other Ambulatory Visit: Payer: Self-pay

## 2022-11-27 ENCOUNTER — Encounter (HOSPITAL_COMMUNITY): Payer: Self-pay

## 2022-11-27 DIAGNOSIS — Y9222 Religious institution as the place of occurrence of the external cause: Secondary | ICD-10-CM | POA: Insufficient documentation

## 2022-11-27 DIAGNOSIS — W010XXA Fall on same level from slipping, tripping and stumbling without subsequent striking against object, initial encounter: Secondary | ICD-10-CM | POA: Insufficient documentation

## 2022-11-27 DIAGNOSIS — S42202A Unspecified fracture of upper end of left humerus, initial encounter for closed fracture: Secondary | ICD-10-CM | POA: Diagnosis not present

## 2022-11-27 DIAGNOSIS — M25512 Pain in left shoulder: Secondary | ICD-10-CM | POA: Diagnosis present

## 2022-11-27 DIAGNOSIS — Y9301 Activity, walking, marching and hiking: Secondary | ICD-10-CM | POA: Insufficient documentation

## 2022-11-27 NOTE — ED Triage Notes (Signed)
Pov from home. Cc of fall. She  missed a step and and landed on her left shoulder. Hurts to move it. No loc or head injury.

## 2022-11-28 MED ORDER — ACETAMINOPHEN 325 MG PO TABS
650.0000 mg | ORAL_TABLET | Freq: Once | ORAL | Status: AC
  Administered 2022-11-28: 650 mg via ORAL
  Filled 2022-11-28: qty 2

## 2022-11-28 NOTE — ED Provider Notes (Signed)
[facility] EMERGENCY DEPARTMENT Provider Note   CSN: [id number] Arrival date & time: 11/27/22  2058     History  Chief Complaint  Patient presents with   Sarah [name] is a 76 y.o. female.  76 yo F who tripped while walking out of church and landed directly on her left shoulder with severe pain to that area every time she moves it. No injuries elsewhere. No blood thinners.   H/o broken hip, did not tolerate narcotic pain meds well.    Fall       Home Medications Prior to Admission medications   Medication Sig Start Date End Date Taking? Authorizing Provider  Ascorbic Acid (VITAMIN C PO) Take by mouth.    [provider]  atorvastatin (LIPITOR) 10 MG tablet TAKE 1 TAKE BY MOUTH EVERY 3 DAYS 05/30/22   Midge Minium, MD  beta carotene w/minerals (OCUVITE) tablet Take 1 tablet by mouth daily.    [provider]  Calcium Citrate (CITRACAL PO) Take 2 tablets by mouth daily.    [provider]  cetirizine (ZYRTEC) 10 MG tablet Take 1 tablet (10 mg total) by mouth daily. 11/04/16   Midge Minium, MD  Coenzyme Q10 (COQ10) 200 MG CAPS Take 1 tablet by mouth daily.    [provider]  EPIPEN 2-PAK 0.3 MG/0.3ML SOAJ injection Inject 1 mg into the muscle as needed. Reported on 06/28/2016 06/29/13   [provider]  Ergocalciferol (VITAMIN D2) 400 units TABS Take 1 capsule by mouth daily.    [provider]  esomeprazole (NEXIUM) 40 MG capsule Take one tablet daily 07/08/22   Midge Minium, MD  fish oil-omega-3 fatty acids 1000 MG capsule Take 2 g by mouth daily.    [provider]  fluticasone (FLONASE) 50 MCG/ACT nasal spray Place 2 sprays into both nostrils daily.    [provider]  lactobacillus acidophilus (BACID) TABS tablet Take 2 tablets by mouth 2 (two) times a week.    [provider]  Multiple Vitamin (MULTIVITAMIN) capsule Take 1 capsule by mouth daily.    [provider]  PROAIR HFA 108 [phone] Base) MCG/ACT inhaler INL 2 PFS PO Q 4 TO 6 H PRN 04/18/17   [provider]  valsartan (DIOVAN) 80 MG tablet Take one tablet daily 10/21/22   Midge Minium, MD      Allergies    Evista [raloxifene], Amoxicillin-pot clavulanate, Fosamax [alendronate], Other, Shellfish allergy, and Singulair [montelukast]    Review of Systems   Review of Systems  Physical Exam Updated Vital Signs BP 139/75 (BP Location: Right Arm)   Pulse 78   Temp (!) 97.5 F (36.4 C) (Oral)   Resp 18   Ht '5\' 5"'$  (1.651 m)   Wt 65.8 kg   LMP 12/16/1993 (Approximate)   SpO2 95%   BMI 24.13 kg/m  Physical Exam Vitals and nursing note reviewed.  Constitutional:      Appearance: She is well-developed.  HENT:     Head: Normocephalic and atraumatic.  Eyes:     Pupils: Pupils are equal, round, and reactive to light.  Cardiovascular:     Rate and Rhythm: Normal rate and regular rhythm.  Pulmonary:     Effort: No respiratory distress.     Breath sounds: No stridor.  Abdominal:     General: There is no distension.  Musculoskeletal:        General: Tenderness (left proximal humerus, NVI) and  deformity present.     Cervical back: Normal range of motion.  Skin:    General: Skin is warm and dry.  Neurological:     Mental Status: She is alert.     ED Results / Procedures / Treatments   Labs (all labs ordered are listed, but only abnormal results are displayed) Labs Reviewed - No data to display  EKG None  Radiology DG Humerus Left  Result Date: 11/27/2022 CLINICAL DATA:  Fall EXAM: LEFT HUMERUS - 2+ VIEW COMPARISON:  CT abdomen and pelvis 08/17/2020. Chest x-ray 09/21/2018. FINDINGS: There is a comminuted fracture of the left humeral neck and proximal diaphysis with apex anterior angulation. There is no dislocation. Widening of the left acromioclavicular joint appears similar to the prior study and is likely postsurgical. Soft tissues are within normal  limits. Large hiatal hernia present. IMPRESSION: Comminuted angulated fracture of the left humeral neck and proximal diaphysis. Electronically Signed   By: Ronney Asters M.D.   On: 11/27/2022 21:35    Procedures Procedures    Medications Ordered in ED Medications  acetaminophen (TYLENOL) tablet 650 mg (650 mg Oral Given 11/28/22 0108)    ED Course/ Medical Decision Making/ A&P                           Medical Decision Making Risk OTC drugs.   Left humerus fracture. NVI. Will ask nursing to apply coaptation splint with ortho follow up. She prefers to utilize tylenol for pain rather than narcotics. Advised on proper doses of tylenol to avoid hepatotoxicity.   Final Clinical Impression(s) / ED Diagnoses Final diagnoses:  Closed fracture of proximal end of left humerus, unspecified fracture morphology, initial encounter    Rx / DC Orders ED Discharge Orders     None         Rosali Augello, Corene Cornea, MD 11/28/22 [phone]

## 2022-12-02 ENCOUNTER — Ambulatory Visit: Payer: Medicare Other | Admitting: Orthopedic Surgery

## 2022-12-04 ENCOUNTER — Encounter: Payer: Self-pay | Admitting: Family Medicine

## 2022-12-23 ENCOUNTER — Encounter: Payer: Self-pay | Admitting: Family Medicine

## 2022-12-27 ENCOUNTER — Other Ambulatory Visit: Payer: Self-pay | Admitting: Family Medicine

## 2023-01-09 ENCOUNTER — Telehealth (INDEPENDENT_AMBULATORY_CARE_PROVIDER_SITE_OTHER): Payer: Medicare Other | Admitting: Family Medicine

## 2023-01-09 ENCOUNTER — Encounter: Payer: Self-pay | Admitting: Family Medicine

## 2023-01-09 VITALS — Temp 101.0°F

## 2023-01-09 DIAGNOSIS — U071 COVID-19: Secondary | ICD-10-CM | POA: Diagnosis not present

## 2023-01-09 MED ORDER — NIRMATRELVIR/RITONAVIR (PAXLOVID)TABLET: 3.0000 | ORAL_TABLET | Freq: Two times a day (BID) | ORAL | 0 refills | Status: AC

## 2023-01-09 NOTE — Progress Notes (Signed)
Virtual Visit via Video   I connected with patient on 01/09/23 at  9:40 AM EST by a video enabled telemedicine application and verified that I am speaking with the correct person using two identifiers.  Location patient: Home Location provider: [facility], Office Persons participating in the virtual visit: Patient, Provider, Ashkum Marcille Blanco C)  I discussed the limitations of evaluation and management by telemedicine and the availability of in person appointments. The patient expressed understanding and agreed to proceed.  Subjective:   HPI:   COVID- pt reports head congestion, dry cough, fever.  Severe body aches.  Tested + for COVID this morning.  Denies SOB.  Pt has taken Paxlovid previously.    ROS:   See pertinent positives and negatives per HPI.  Patient Active Problem List   Diagnosis Date Noted   Trochanteric bursitis, left hip 11/05/2022   Pain in left knee 03/06/2021   Allergic rhinitis 02/20/2021   Bee allergy status 02/20/2021   Gastro-esophageal reflux disease without esophagitis 02/20/2021   Rash and other nonspecific skin eruption 02/20/2021   Seafood allergy 02/20/2021   Toxic effect of venom of bees, accidental (unintentional), subsequent encounter 02/20/2021   Pain in left leg 10/11/2020   Diverticulitis 07/09/2017   Primary osteoarthritis of both hands 12/23/2016   Primary osteoarthritis of both knees 12/23/2016   Postural kyphosis of thoracic region 12/23/2016   DJD (degenerative joint disease), cervical 12/23/2016   History of gastroesophageal reflux (GERD) 12/23/2016   History of fracture of left hip 12/23/2016   Physical exam 11/04/2016   Environmental and seasonal allergies 06/27/2016   Esophageal dysphagia 06/03/2016   Osteoporosis 08/03/2014   Hyperlipidemia 11/06/2013   Hypertension 11/06/2013    Social History   Tobacco Use   Smoking status: Never   Smokeless tobacco: Never  Substance Use Topics   Alcohol use: Yes     Alcohol/week: 2.0 standard drinks of alcohol    Types: 2 Glasses of wine per week    Current Outpatient Medications:    Ascorbic Acid (VITAMIN C PO), Take by mouth., Disp: , Rfl:    atorvastatin (LIPITOR) 10 MG tablet, TAKE 1 TAKE BY MOUTH EVERY 3 DAYS, Disp: 90 tablet, Rfl: 1   beta carotene w/minerals (OCUVITE) tablet, Take 1 tablet by mouth daily., Disp: , Rfl:    Calcium Citrate (CITRACAL PO), Take 2 tablets by mouth daily., Disp: , Rfl:    cetirizine (ZYRTEC) 10 MG tablet, Take 1 tablet (10 mg total) by mouth daily., Disp: 30 tablet, Rfl: 11   Coenzyme Q10 (COQ10) 200 MG CAPS, Take 1 tablet by mouth daily., Disp: , Rfl:    EPIPEN 2-PAK 0.3 MG/0.3ML SOAJ injection, Inject 1 mg into the muscle as needed. Reported on 06/28/2016, Disp: , Rfl:    Ergocalciferol (VITAMIN D2) 400 units TABS, Take 1 capsule by mouth daily., Disp: , Rfl:    esomeprazole (NEXIUM) 40 MG capsule, TAKE 1 CAPSULE BY MOUTH DAILY, Disp: 90 capsule, Rfl: 1   fish oil-omega-3 fatty acids 1000 MG capsule, Take 2 g by mouth daily., Disp: , Rfl:    fluticasone (FLONASE) 50 MCG/ACT nasal spray, Place 2 sprays into both nostrils daily., Disp: , Rfl:    lactobacillus acidophilus (BACID) TABS tablet, Take 2 tablets by mouth 2 (two) times a week., Disp: , Rfl:    Multiple Vitamin (MULTIVITAMIN) capsule, Take 1 capsule by mouth daily., Disp: , Rfl:    PROAIR HFA 108 (90 Base) MCG/ACT inhaler, INL 2 PFS PO Q 4  TO 6 H PRN, Disp: , Rfl: 0   valsartan (DIOVAN) 80 MG tablet, Take one tablet daily, Disp: 90 tablet, Rfl: 1  Allergies  Allergen Reactions   Evista [Raloxifene]     Respiratory infections, shortness of breath(not anaphylaxis), leg heaviness.   Amoxicillin-Pot Clavulanate Hives and Other (See Comments)   Fosamax [Alendronate] Other (See Comments)    "Due to Esophagus problems"--narrow   Other Other (See Comments)   Shellfish Allergy Hives   Singulair [Montelukast] Other (See Comments)    unknown    Objective:    Temp (!) 101 F (38.3 C) (Oral)   LMP 12/16/1993 (Approximate)  AAOx3, NAD NCAT, EOMI No obvious CN deficits Coloring WNL Pt is able to speak clearly, coherently without shortness of breath or increased work of breathing.  Thought process is linear.  Mood is appropriate.   Assessment and Plan:   COVID- pt tested + this morning after starting to feel badly yesterday.  Is interested in Paxlovid.  She is aware to hold her Atorvastatin while on the medication.  Reviewed supportive care and red flags that should prompt return.  Pt expressed understanding and is in agreement w/ plan.    Annye Asa, MD 01/09/2023

## 2023-01-28 ENCOUNTER — Other Ambulatory Visit: Payer: Self-pay | Admitting: Obstetrics and Gynecology

## 2023-01-28 DIAGNOSIS — Z1231 Encounter for screening mammogram for malignant neoplasm of breast: Secondary | ICD-10-CM

## 2023-02-13 ENCOUNTER — Encounter: Payer: Self-pay | Admitting: Radiology

## 2023-02-26 ENCOUNTER — Telehealth: Payer: Self-pay | Admitting: Family Medicine

## 2023-02-26 NOTE — Telephone Encounter (Signed)
Contacted Shakeitha B Doerr to schedule their annual wellness visit. Appointment made for 03/06/2023.  Thank you,  [place] Direct dial  [phone]

## 2023-03-06 ENCOUNTER — Ambulatory Visit (INDEPENDENT_AMBULATORY_CARE_PROVIDER_SITE_OTHER): Payer: Medicare Other | Admitting: Family Medicine

## 2023-03-06 DIAGNOSIS — Z0000 Encounter for general adult medical examination without abnormal findings: Secondary | ICD-10-CM | POA: Diagnosis not present

## 2023-03-06 NOTE — Progress Notes (Signed)
Subjective:   Sarah [name] is a 77 y.o. female who presents for Medicare Annual (Subsequent) preventive examination.  I connected with  Yixin Culliton Amstutz on 03/06/23 by a telephone enabled telemedicine application and verified that I am speaking with the correct person using two identifiers.   I discussed the limitations of evaluation and management by telemedicine. The patient expressed understanding and agreed to proceed.  Patient location: home  Provider location: home/telephone    Review of Systems     Cardiac Risk Factors include: advanced age (>18men, >67 women);family history of premature cardiovascular disease;hypertension     Objective:    Today's Vitals   There is no height or weight on file to calculate BMI.     03/06/2023    2:05 PM 11/27/2022    9:03 PM 04/12/2022    9:45 AM 12/06/2021   10:36 AM 11/27/2020    8:11 AM 11/22/2019    8:04 AM 11/19/2018    9:15 AM  Advanced Directives  Does Patient Have a Medical Advance Directive? Yes Yes Yes Yes Yes Yes Yes  Type of Paramedic of [place];Living will Living will;Healthcare Power of [place];Living will [place];Living will [place];Living will [place];Living will  Does patient want to make changes to medical advance directive?   No - Patient declined   Yes (MAU/Ambulatory/Procedural Areas - Information given)   Copy of [place] in Chart? Yes - validated most recent copy scanned in chart (See row information)  No - copy requested No - copy requested No - copy requested No - copy requested No - copy requested    Current Medications (verified) Outpatient Encounter Medications as of 03/06/2023  Medication Sig   Ascorbic Acid (VITAMIN C PO) Take by mouth.   atorvastatin (LIPITOR) 10 MG tablet TAKE 1 TAKE BY MOUTH EVERY 3 DAYS   beta carotene w/minerals  (OCUVITE) tablet Take 1 tablet by mouth daily.   Calcium Citrate (CITRACAL PO) Take 2 tablets by mouth daily.   cetirizine (ZYRTEC) 10 MG tablet Take 1 tablet (10 mg total) by mouth daily.   Coenzyme Q10 (COQ10) 200 MG CAPS Take 1 tablet by mouth daily.   EPIPEN 2-PAK 0.3 MG/0.3ML SOAJ injection Inject 1 mg into the muscle as needed. Reported on 06/28/2016   Ergocalciferol (VITAMIN D2) 400 units TABS Take 1 capsule by mouth daily.   esomeprazole (NEXIUM) 40 MG capsule TAKE 1 CAPSULE BY MOUTH DAILY   fish oil-omega-3 fatty acids 1000 MG capsule Take 2 g by mouth daily.   fluticasone (FLONASE) 50 MCG/ACT nasal spray Place 2 sprays into both nostrils daily.   lactobacillus acidophilus (BACID) TABS tablet Take 2 tablets by mouth 2 (two) times a week.   Multiple Vitamin (MULTIVITAMIN) capsule Take 1 capsule by mouth daily.   PROAIR HFA 108 (90 Base) MCG/ACT inhaler INL 2 PFS PO Q 4 TO 6 H PRN   valsartan (DIOVAN) 80 MG tablet Take one tablet daily   No facility-administered encounter medications on file as of 03/06/2023.    Allergies (verified) Evista [raloxifene], Amoxicillin-pot clavulanate, Fosamax [alendronate], Other, Shellfish allergy, and Singulair [montelukast]   History: Past Medical History:  Diagnosis Date   Allergy    Arthritis    Asthma    Cancer ([place])    basal cell cancer--nose   Cataract    left eye and stable    Cervical disc disease  bulging   Diverticulitis    Diverticulosis    GERD (gastroesophageal reflux disease)    Hyperlipidemia    Hypertension    Osteopenia    Osteoporosis    PONV (postoperative nausea and vomiting)    Status post dilation of esophageal narrowing    Past Surgical History:  Procedure Laterality Date   ABDOMINAL HYSTERECTOMY  1995   TVH--still has ovaries   ABDOMINAL SACROCOLPOPEXY  2007   Halban culdoplasty & Burch procedure--Dr. Quincy Simmonds   APPENDECTOMY     Dr. Hassell Done   arthroscopic shoulder Left    Dr. Durward Fortes, bone spur   BLADDER  SUSPENSION     CERVICAL FUSION     C-4, C-5 fusion   CHOLECYSTECTOMY     Dr. Georgina Quint   ESOPHAGEAL DILATION     HIP ARTHROPLASTY Left 11/07/2013   Procedure: ARTHROPLASTY BIPOLAR HIP;  Surgeon: Garald Balding, MD;  Location: [facility];  Service: Orthopedics;  Laterality: Left;   KNEE ARTHROSCOPY Bilateral    Dr. Durward Fortes   OTHER SURGICAL HISTORY  2011   posterior Colporrhaphy with Xenform biological graft--Dr. Allyn Kenner REPAIR  2011   posterior colporrhaphy with Xenform biological graft--Dr. Quincy Simmonds   UPPER GASTROINTESTINAL ENDOSCOPY     Family History  Problem Relation Age of Onset   Hypertension Mother    Diabetes Father    Hypertension Father    Heart disease Father    Diabetes Sister    Hypertension Sister    Schizophrenia Sister    Thyroid disease Sister    Heart disease Sister    Throat cancer Paternal Uncle        smoker   Lung cancer Cousin        smoker   Colon cancer Neg Hx    Pancreatic cancer Neg Hx    Stomach cancer Neg Hx    Liver disease Neg Hx    Colon polyps Neg Hx    Esophageal cancer Neg Hx    Rectal cancer Neg Hx    Social History   Socioeconomic History   Marital status: Married    Spouse name: Not on file   Number of children: 1   Years of education: Not on file   Highest education level: Not on file  Occupational History   Occupation: retired  Tobacco Use   Smoking status: Never   Smokeless tobacco: Never  Vaping Use   Vaping Use: Never used  Substance and Sexual Activity   Alcohol use: Yes    Alcohol/week: 2.0 standard drinks of alcohol    Types: 2 Glasses of wine per week   Drug use: No   Sexual activity: Yes    Partners: Male    Birth control/protection: Surgical    Comment: TVH--Still has ovaries,declined inuracne questions  Other Topics Concern   Not on file  Social History Narrative   ** Merged History Encounter **       Social Determinants of Health   Financial Resource Strain: Low Risk  (03/06/2023)    Overall Financial Resource Strain (CARDIA)    Difficulty of Paying Living Expenses: Not hard at all  Food Insecurity: No Food Insecurity (03/06/2023)   Hunger Vital Sign    Worried About Running Out of Food in the Last Year: Never true    Ran Out of Food in the Last Year: Never true  Transportation Needs: No Transportation Needs (03/06/2023)   PRAPARE - Hydrologist (Medical): No  Lack of Transportation (Non-Medical): No  Physical Activity: Insufficiently Active (03/06/2023)   Exercise Vital Sign    Days of Exercise per Week: 4 days    Minutes of Exercise per Session: 30 min  Stress: No Stress Concern Present (03/06/2023)   [place]    Feeling of Stress : Not at all  Social Connections: [place] (03/06/2023)   Social Connection and Isolation Panel [NHANES]    Frequency of Communication with Friends and Family: Three times a week    Frequency of Social Gatherings with Friends and Family: More than three times a week    Attends Religious Services: More than 4 times per year    Active Member of [employer] or Organizations: Yes    Attends Music therapist: More than 4 times per year    Marital Status: Married    Tobacco Counseling Counseling given: Not Answered   Clinical Intake:  Pre-visit preparation completed: Yes  Pain : No/denies pain     Diabetes: No  How often do you need to have someone help you when you read instructions, pamphlets, or other written materials from your doctor or pharmacy?: 1 - Never  Diabetic?  no  Interpreter Needed?: No  Information entered by :: Leroy Kennedy LPN   Activities of Daily Living    03/06/2023    2:06 PM 03/03/2023   10:48 AM  In your present state of health, do you have any difficulty performing the following activities:  Hearing? 0 0  Vision? 0 0  Difficulty concentrating or making decisions? 0 0  Walking or  climbing stairs? 0 0  Dressing or bathing? 0 0  Doing errands, shopping? 0 0  Preparing Food and eating ? N N  Using the Toilet? N N  In the past six months, have you accidently leaked urine? N N  Do you have problems with loss of bowel control? N N  Managing your Medications? N N  Managing your Finances? N N  Housekeeping or managing your Housekeeping? N N    Patient Care Team: Midge Minium, MD as PCP - General (Family Medicine) Milus Banister, MD as Attending Physician (Gastroenterology) Harold Hedge, Darrick Grinder, MD as Consulting Physician (Allergy and Immunology) Nunzio Cobbs, MD as Consulting Physician (Obstetrics and Gynecology) Consuella Lose, MD as Consulting Physician (Neurosurgery) Otelia Sergeant, OD as Consulting Physician Jamesetta Geralds, [place] (Chiropractic Medicine) Center, Skin Surgery  Indicate any recent Medical Services you may have received from other than [facility] providers in the past year (date may be approximate).     Assessment:   This is a routine wellness examination for [facility].  Hearing/Vision screen Hearing Screening - Comments:: No trouble hearing Vision Screening - Comments:: Up to date [facility]  Dietary issues and exercise activities discussed: Current Exercise Habits: Home exercise routine, Type of exercise: walking, Time (Minutes): 30, Frequency (Times/Week): 4, Weekly Exercise (Minutes/Week): 120, Intensity: Mild, Exercise limited by: None identified   Goals Addressed             This Visit's Progress    Weight (lb) < 200 lb (90.7 kg)         Depression Screen    03/06/2023    2:11 PM 01/09/2023    9:30 AM 08/09/2022    9:03 AM 05/08/2022    2:24 PM 01/30/2022    2:39 PM 12/06/2021   10:38 AM 12/06/2021   10:35 AM  PHQ 2/9 Scores  PHQ - 2 Score 0 0 0 0 0 0 0  PHQ- 9 Score 1 0 0 0 0      Fall Risk    03/06/2023    2:04 PM 03/03/2023   10:48 AM 01/09/2023    9:30 AM 08/09/2022    9:03 AM 05/08/2022    2:24 PM  Fall Risk    Falls in the past year? 1 1 1 1 1   Number falls in past yr: 0 0 0 0 0  Injury with Fall? 1 1 1  0 0  Risk for fall due to :   No Fall Risks History of fall(s) No Fall Risks  Follow up Falls evaluation completed;Education provided;Falls prevention discussed  Falls evaluation completed Falls evaluation completed Falls evaluation completed    FALL RISK PREVENTION PERTAINING TO THE HOME:  Any stairs in or around the home? Yes  If so, are there any without handrails? No  Home free of loose throw rugs in walkways, pet beds, electrical cords, etc? Yes  Adequate lighting in your home to reduce risk of falls? Yes   ASSISTIVE DEVICES UTILIZED TO PREVENT FALLS:  Life alert? No  Use of a cane, walker or w/c? No  Grab bars in the bathroom? No  Shower chair or bench in shower? Yes  Elevated toilet seat or a handicapped toilet? No   TIMED UP AND GO:  Was the test performed? No .    Cognitive Function:    11/19/2018    9:15 AM 11/12/2017    9:37 AM  MMSE - Mini Mental State Exam  Orientation to time 5 5  Orientation to Place 5 5  Registration 3 3  Attention/ Calculation 5 5  Recall 3 3  Language- name 2 objects 2 2  Language- repeat 1 1  Language- follow 3 step command 3 3  Language- read & follow direction 1 1  Write a sentence 1 1  Copy design 1 1  Total score 30 30        03/06/2023    2:08 PM 11/27/2020    8:22 AM  6CIT Screen  What Year? 0 points 0 points  What month? 3 points 0 points  What time? 0 points 0 points  Count back from 20 0 points 0 points  Months in reverse 0 points 0 points  Repeat phrase 0 points 0 points  Total Score 3 points 0 points    Immunizations Immunization History  Administered Date(s) Administered   Fluad Quad(high Dose 65+) 09/28/2021   Influenza, High Dose Seasonal PF 09/21/2014, 09/02/2016, 03/25/2017, 07/15/2018, 01/20/2020, 09/12/2020   Influenza,inj,Quad PF,6+ Mos 09/05/2015, 09/15/2018, 10/02/2018, 08/10/2019, 08/09/2022    Influenza-Unspecified 08/15/2017   PFIZER Comirnaty(Gray Top)Covid-19 Tri-Sucrose Vaccine 04/20/2021   PFIZER(Purple Top)SARS-COV-2 Vaccination 12/27/2019, 01/17/2020, 09/12/2020, 03/23/2021, 03/19/2022   Pneumococcal Conjugate-13 05/22/2015   Pneumococcal Polysaccharide-23 11/04/2016, 03/25/2017, 07/15/2018, 09/21/2018, 01/20/2020, 03/23/2021   Zoster Recombinat (Shingrix) 03/24/2017, 07/15/2017    TDAP status: Due, Education has been provided regarding the importance of this vaccine. Advised may receive this vaccine at local pharmacy or Health Dept. Aware to provide a copy of the vaccination record if obtained from local pharmacy or Health Dept. Verbalized acceptance and understanding.  Flu Vaccine status: Up to date  Pneumococcal vaccine status: Up to date  Covid-19 vaccine status: Information provided on how to obtain vaccines.   Qualifies for Shingles Vaccine? No   Zostavax completed Yes   Shingrix Completed?: Yes  Screening Tests Health Maintenance  Topic Date Due  Medicare Annual Wellness (AWV)  03/05/2024   Pneumonia Vaccine 72+ Years old  Completed   INFLUENZA VACCINE  Completed   DEXA SCAN  Completed   Hepatitis C Screening  Completed   Zoster Vaccines- Shingrix  Completed   HPV VACCINES  Aged Out   DTaP/Tdap/Td  Discontinued   COVID-19 Vaccine  Discontinued   Fecal DNA (Cologuard)  Discontinued    Health Maintenance  There are no preventive care reminders to display for this patient.   Colorectal cancer screening: No longer required.   Mammogram scheduled  Bone Density declined  Lung Cancer Screening: (Low Dose CT Chest recommended if Age 74-80 years, 30 pack-year currently smoking OR have quit w/in 15years.) does not qualify.   Lung Cancer Screening Referral:   Additional Screening:  Hepatitis C Screening: does not qualify; Completed 2018  Vision Screening: Recommended annual ophthalmology exams for early detection of glaucoma and other disorders of  the eye. Is the patient up to date with their annual eye exam?  Yes  Who is the provider or what is the name of the office in which the patient attends annual eye exams? [facility] If pt is not established with a provider, would they like to be referred to a provider to establish care? No .   Dental Screening: Recommended annual dental exams for proper oral hygiene  Community Resource Referral / Chronic Care Management: CRR required this visit?  No   CCM required this visit?  No      Plan:     I have personally reviewed and noted the following in the patient's chart:   Medical and social history Use of alcohol, tobacco or illicit drugs  Current medications and supplements including opioid prescriptions. Patient is not currently taking opioid prescriptions. Functional ability and status Nutritional status Physical activity Advanced directives List of other physicians Hospitalizations, surgeries, and ER visits in previous 12 months Vitals Screenings to include cognitive, depression, and falls Referrals and appointments  In addition, I have reviewed and discussed with patient certain preventive protocols, quality metrics, and best practice recommendations. A written personalized care plan for preventive services as well as general preventive health recommendations were provided to patient.     Leroy Kennedy, LPN   [id number]   Nurse Notes:

## 2023-03-06 NOTE — Patient Instructions (Signed)
Sarah [name] , Thank you for taking time to come for your Medicare Wellness Visit. I appreciate your ongoing commitment to your health goals. Please review the following plan we discussed and let me know if I can assist you in the future.   Screening recommendations/referrals: Colonoscopy: no longer required Mammogram: scheduled Bone Density: not needed  Recommended yearly ophthalmology/optometry visit for glaucoma screening and checkup Recommended yearly dental visit for hygiene and checkup  Vaccinations: Influenza vaccine: up to ate Pneumococcal vaccine: up to date Tdap vaccine: Education provided Shingles vaccine: up to date    Advanced directives: up to date    Preventive Care 3 Years and Older, Female Preventive care refers to lifestyle choices and visits with your health care provider that can promote health and wellness. What does preventive care include? A yearly physical exam. This is also called an annual well check. Dental exams once or twice a year. Routine eye exams. Ask your health care provider how often you should have your eyes checked. Personal lifestyle choices, including: Daily care of your teeth and gums. Regular physical activity. Eating a healthy diet. Avoiding tobacco and drug use. Limiting alcohol use. Practicing safe sex. Taking low-dose aspirin every day. Taking vitamin and mineral supplements as recommended by your health care provider. What happens during an annual well check? The services and screenings done by your health care provider during your annual well check will depend on your age, overall health, lifestyle risk factors, and family history of disease. Counseling  Your health care provider may ask you questions about your: Alcohol use. Tobacco use. Drug use. Emotional well-being. Home and relationship well-being. Sexual activity. Eating habits. History of falls. Memory and ability to understand (cognition). Work and work  Statistician. Reproductive health. Screening  You may have the following tests or measurements: Height, weight, and BMI. Blood pressure. Lipid and cholesterol levels. These may be checked every 5 years, or more frequently if you are over 32 years old. Skin check. Lung cancer screening. You may have this screening every year starting at age 1 if you have a 30-pack-year history of smoking and currently smoke or have quit within the past 15 years. Fecal occult blood test (FOBT) of the stool. You may have this test every year starting at age 78. Flexible sigmoidoscopy or colonoscopy. You may have a sigmoidoscopy every 5 years or a colonoscopy every 10 years starting at age 70. Hepatitis C blood test. Hepatitis B blood test. Sexually transmitted disease (STD) testing. Diabetes screening. This is done by checking your blood sugar (glucose) after you have not eaten for a while (fasting). You may have this done every 1-3 years. Bone density scan. This is done to screen for osteoporosis. You may have this done starting at age 89. Mammogram. This may be done every 1-2 years. Talk to your health care provider about how often you should have regular mammograms. Talk with your health care provider about your test results, treatment options, and if necessary, the need for more tests. Vaccines  Your health care provider may recommend certain vaccines, such as: Influenza vaccine. This is recommended every year. Tetanus, diphtheria, and acellular pertussis (Tdap, Td) vaccine. You may need a Td booster every 10 years. Zoster vaccine. You may need this after age 1. Pneumococcal 13-valent conjugate (PCV13) vaccine. One dose is recommended after age 73. Pneumococcal polysaccharide (PPSV23) vaccine. One dose is recommended after age 67. Talk to your health care provider about which screenings and vaccines you need and how often you  need them. This information is not intended to replace advice given to you by  your health care provider. Make sure you discuss any questions you have with your health care provider. Document Released: 12/29/2015 Document Revised: 08/21/2016 Document Reviewed: 10/03/2015 Elsevier Interactive Patient Education  2017 [place] Prevention in the Home Falls can cause injuries. They can happen to people of all ages. There are many things you can do to make your home safe and to help prevent falls. What can I do on the outside of my home? Regularly fix the edges of walkways and driveways and fix any cracks. Remove anything that might make you trip as you walk through a door, such as a raised step or threshold. Trim any bushes or trees on the path to your home. Use bright outdoor lighting. Clear any walking paths of anything that might make someone trip, such as rocks or tools. Regularly check to see if handrails are loose or broken. Make sure that both sides of any steps have handrails. Any raised decks and porches should have guardrails on the edges. Have any leaves, snow, or ice cleared regularly. Use sand or salt on walking paths during winter. Clean up any spills in your garage right away. This includes oil or grease spills. What can I do in the bathroom? Use night lights. Install grab bars by the toilet and in the tub and shower. Do not use towel bars as grab bars. Use non-skid mats or decals in the tub or shower. If you need to sit down in the shower, use a plastic, non-slip stool. Keep the floor dry. Clean up any water that spills on the floor as soon as it happens. Remove soap buildup in the tub or shower regularly. Attach bath mats securely with double-sided non-slip rug tape. Do not have throw rugs and other things on the floor that can make you trip. What can I do in the bedroom? Use night lights. Make sure that you have a light by your bed that is easy to reach. Do not use any sheets or blankets that are too big for your bed. They should not hang  down onto the floor. Have a firm chair that has side arms. You can use this for support while you get dressed. Do not have throw rugs and other things on the floor that can make you trip. What can I do in the kitchen? Clean up any spills right away. Avoid walking on wet floors. Keep items that you use a lot in easy-to-reach places. If you need to reach something above you, use a strong step stool that has a grab bar. Keep electrical cords out of the way. Do not use floor polish or wax that makes floors slippery. If you must use wax, use non-skid floor wax. Do not have throw rugs and other things on the floor that can make you trip. What can I do with my stairs? Do not leave any items on the stairs. Make sure that there are handrails on both sides of the stairs and use them. Fix handrails that are broken or loose. Make sure that handrails are as long as the stairways. Check any carpeting to make sure that it is firmly attached to the stairs. Fix any carpet that is loose or worn. Avoid having throw rugs at the top or bottom of the stairs. If you do have throw rugs, attach them to the floor with carpet tape. Make sure that you have a light switch  at the top of the stairs and the bottom of the stairs. If you do not have them, ask someone to add them for you. What else can I do to help prevent falls? Wear shoes that: Do not have high heels. Have rubber bottoms. Are comfortable and fit you well. Are closed at the toe. Do not wear sandals. If you use a stepladder: Make sure that it is fully opened. Do not climb a closed stepladder. Make sure that both sides of the stepladder are locked into place. Ask someone to hold it for you, if possible. Clearly mark and make sure that you can see: Any grab bars or handrails. First and last steps. Where the edge of each step is. Use tools that help you move around (mobility aids) if they are needed. These  include: Canes. Walkers. Scooters. Crutches. Turn on the lights when you go into a dark area. Replace any light bulbs as soon as they burn out. Set up your furniture so you have a clear path. Avoid moving your furniture around. If any of your floors are uneven, fix them. If there are any pets around you, be aware of where they are. Review your medicines with your doctor. Some medicines can make you feel dizzy. This can increase your chance of falling. Ask your doctor what other things that you can do to help prevent falls. This information is not intended to replace advice given to you by your health care provider. Make sure you discuss any questions you have with your health care provider. Document Released: 09/28/2009 Document Revised: 05/09/2016 Document Reviewed: 01/06/2015 Elsevier Interactive Patient Education  2017 [employer].

## 2023-03-13 ENCOUNTER — Ambulatory Visit
Admission: RE | Admit: 2023-03-13 | Discharge: 2023-03-13 | Disposition: A | Discharge status: Home / Self Care | Payer: Medicare Other | Source: Ambulatory Visit | Attending: Obstetrics and Gynecology | Admitting: Obstetrics and Gynecology

## 2023-03-13 DIAGNOSIS — Z1231 Encounter for screening mammogram for malignant neoplasm of breast: Secondary | ICD-10-CM

## 2023-03-26 ENCOUNTER — Other Ambulatory Visit: Payer: Self-pay | Admitting: Family Medicine

## 2023-04-12 ENCOUNTER — Other Ambulatory Visit: Payer: Self-pay | Admitting: Family Medicine

## 2023-04-12 DIAGNOSIS — I10 Essential (primary) hypertension: Secondary | ICD-10-CM

## 2023-04-24 ENCOUNTER — Ambulatory Visit (INDEPENDENT_AMBULATORY_CARE_PROVIDER_SITE_OTHER): Payer: Medicare Other | Admitting: Family Medicine

## 2023-04-24 VITALS — BP 110/60 | HR 72 | Temp 99.0°F | Resp 17 | Ht 65.0 in | Wt 148.5 lb

## 2023-04-24 DIAGNOSIS — M79662 Pain in left lower leg: Secondary | ICD-10-CM

## 2023-04-24 NOTE — Progress Notes (Signed)
   Subjective:    Patient ID: Sarah [name], female    DOB: [birth year], 77 y.o.   MRN: [id number]  HPI Leg pain- pt reports that she had calf pain on Tuesday night.  Was not similar to past [place].  Pain was present all day yesterday and has now moved up to behind the knee.  Area is not warm to touch or red.  No recent immobilization or long car/plane rides.  No changes to medication.  Pain is described as a 'throbbing' pain and is minimal currently.  Nothing makes pain worse.  Has not tried anything to relieve pain.  This is not similar to any prior pain.  No change in activity level.   Review of Systems For ROS see HPI     Objective:   Physical Exam Vitals reviewed.  Constitutional:      General: She is not in acute distress.    Appearance: Normal appearance. She is not ill-appearing.  Cardiovascular:     Rate and Rhythm: Normal rate and regular rhythm.     Pulses: Normal pulses.  Pulmonary:     Effort: Pulmonary effort is normal. No respiratory distress.  Musculoskeletal:        General: Tenderness (TTP over L posterior calf and behind knee) present.     Right lower leg: No edema.     Left lower leg: Edema (trace LE edema) present.  Skin:    General: Skin is warm and dry.     Findings: No erythema.  Neurological:     General: No focal deficit present.     Mental Status: She is alert and oriented to person, place, and time.  Psychiatric:        Mood and Affect: Mood normal.        Behavior: Behavior normal.        Thought Content: Thought content normal.           Assessment & Plan:  Pain L lower leg- new.  Sudden onset pain, trace swelling.  No palpable cord, no redness or warmth.  Must r/o DVT.  Get stat venous doppler.  Start full strength ASA in case of clot.  Encouraged heat.  Will determine next steps based on doppler results.  Pt expressed understanding and is in agreement w/ plan.

## 2023-04-24 NOTE — Patient Instructions (Addendum)
Follow up as needed or as scheduled Sarah [name] (our referral coordinator) is working on getting things approved by insurance and scheduled.  She is going to call you with the information- please answer your phone when she calls Start a daily Aspirin 325mg  (full strength) with food- just in case of a clot Apply heat to the area to help w/ pain Call with any questions or concerns Hang in there!

## 2023-04-25 ENCOUNTER — Encounter: Payer: Self-pay | Admitting: Family Medicine

## 2023-04-25 ENCOUNTER — Ambulatory Visit (HOSPITAL_COMMUNITY)
Admission: RE | Admit: 2023-04-25 | Discharge: 2023-04-25 | Disposition: A | Discharge status: Home / Self Care | Payer: Medicare Other | Source: Ambulatory Visit | Attending: Family Medicine | Admitting: Family Medicine

## 2023-04-25 DIAGNOSIS — M79662 Pain in left lower leg: Secondary | ICD-10-CM

## 2023-04-28 ENCOUNTER — Telehealth: Payer: Self-pay

## 2023-04-28 NOTE — Telephone Encounter (Signed)
-----   Message from Sheliah Hatch, MD sent at 04/28/2023  7:42 AM EDT ----- Thankfully no evidence of clot- great news!

## 2023-04-28 NOTE — Telephone Encounter (Signed)
Pt seen results Via my chart  

## 2023-07-16 ENCOUNTER — Other Ambulatory Visit: Payer: Self-pay | Admitting: Family Medicine

## 2023-08-11 ENCOUNTER — Other Ambulatory Visit: Payer: Self-pay

## 2023-08-11 DIAGNOSIS — E785 Hyperlipidemia, unspecified: Secondary | ICD-10-CM

## 2023-08-11 MED ORDER — ATORVASTATIN CALCIUM 10 MG PO TABS: ORAL_TABLET | ORAL | 1 refills | Status: DC

## 2023-08-14 ENCOUNTER — Ambulatory Visit (INDEPENDENT_AMBULATORY_CARE_PROVIDER_SITE_OTHER): Payer: Medicare Other

## 2023-08-14 DIAGNOSIS — Z23 Encounter for immunization: Secondary | ICD-10-CM | POA: Diagnosis not present

## 2023-08-14 NOTE — Progress Notes (Signed)
Pt received her flu vaccine today . Gave 1 mL in left deltoid and pt tolerated well . No concerns at this time

## 2023-08-29 NOTE — Addendum Note (Signed)
Addended byJaci Lazier, Brenae Lasecki on: 08/29/2023 10:56 AM   Modules accepted: Orders

## 2023-09-22 DIAGNOSIS — R0781 Pleurodynia: Secondary | ICD-10-CM | POA: Insufficient documentation

## 2023-10-03 ENCOUNTER — Other Ambulatory Visit: Payer: Self-pay | Admitting: Family Medicine

## 2023-10-03 DIAGNOSIS — I10 Essential (primary) hypertension: Secondary | ICD-10-CM

## 2023-10-11 ENCOUNTER — Other Ambulatory Visit: Payer: Self-pay | Admitting: Family Medicine

## 2023-10-15 DIAGNOSIS — S52502A Unspecified fracture of the lower end of left radius, initial encounter for closed fracture: Secondary | ICD-10-CM | POA: Insufficient documentation

## 2023-10-20 ENCOUNTER — Encounter: Payer: Self-pay | Admitting: Gastroenterology

## 2023-10-20 ENCOUNTER — Ambulatory Visit (INDEPENDENT_AMBULATORY_CARE_PROVIDER_SITE_OTHER): Payer: Medicare Other | Admitting: Gastroenterology

## 2023-10-20 VITALS — BP 120/62 | HR 54 | Ht 64.0 in | Wt 148.0 lb

## 2023-10-20 DIAGNOSIS — K449 Diaphragmatic hernia without obstruction or gangrene: Secondary | ICD-10-CM

## 2023-10-20 DIAGNOSIS — R1319 Other dysphagia: Secondary | ICD-10-CM

## 2023-10-20 DIAGNOSIS — Z8719 Personal history of other diseases of the digestive system: Secondary | ICD-10-CM

## 2023-10-20 NOTE — Patient Instructions (Signed)
You have been scheduled for an endoscopy. Please follow written instructions given to you at your visit today.  If you use inhalers (even only as needed), please bring them with you on the day of your procedure.  If you take any of the following medications, they will need to be adjusted prior to your procedure:   DO NOT TAKE 7 DAYS PRIOR TO TEST- Trulicity (dulaglutide) Ozempic, Wegovy (semaglutide) Mounjaro (tirzepatide) Bydureon Bcise (exanatide extended release)  DO NOT TAKE 1 DAY PRIOR TO YOUR TEST Rybelsus (semaglutide) Adlyxin (lixisenatide) Victoza (liraglutide) Byetta (exanatide) ___________________________________________________________________________   _______________________________________________________  If your blood pressure at your visit was 140/90 or greater, please contact your primary care physician to follow up on this.  _______________________________________________________  If you are age 26 or older, your body mass index should be between 23-30. Your Body mass index is 25.4 kg/m. If this is out of the aforementioned range listed, please consider follow up with your Primary Care Provider.  If you are age 73 or younger, your body mass index should be between 19-25. Your Body mass index is 25.4 kg/m. If this is out of the aformentioned range listed, please consider follow up with your Primary Care Provider.   ________________________________________________________  The [facility] providers would like to encourage you to use [facility] to communicate with providers for non-urgent requests or questions.  Due to long hold times on the telephone, sending your provider a message by [facility] may be a faster and more efficient way to get a response.  Please allow 48 business hours for a response.  Please remember that this is for non-urgent requests.  _______________________________________________________

## 2023-10-20 NOTE — Progress Notes (Signed)
Discussed the use of AI scribe software for clinical note transcription with the patient, who gave verbal consent to proceed.  HPI : Sarah [name] is a 77 y.o. female previously followed by Dr. Christella Hartigan who presents for further evaluation/management of dysphagia. She reports her dysphagia has been a recurrent, longstanding problem.  She underwent an EGD with dilation in 2001 by Dr. Corinda Gubler who noted a tight cricopharyngeus and dilated her with a 38 [place] Maloney.  Her dysphagia returned, and Dr. Christella Hartigan performed an EGD in 2017 and noted a mild stricture at the [facility], which she dilated with a 20 mm balloon and subsequent mucosal rent.  The patient states that following both of these dilations, her dysphagia was significantly improved. She notes that a couple of months ago she started having recurrent episodes of dysphagia again, prompting her to seek repeat upper endoscopy. She describes her dysphagia as sensation of food getting stuck in her chest, with the most recent episode occurring two weeks ago while eating a sandwich. The sensation resolved within minutes after drinking water. The dysphagia is not a daily occurrence and does not seem to be associated with specific foods. Liquids are reported to go down without issue.  No episodes of having to forcefully vomit stuck food.  The patient also has a history of acid reflux, for which she takes Nexium daily. She reports occasional acid reflux symptoms despite medication. There have been no significant changes in weight or other gastrointestinal symptoms such as abdominal pain, nausea, vomiting, or changes in bowel habits. The patient had a colonoscopy in 2022 due to abdominal pain, which revealed diverticulosis, but this pain is no longer an issue.  The patient also has a known hiatal hernia noted on CT scan in 2022.    1. Routine risk for colon cancer: Colonoscopy Dr. Corinda Gubler 2001 for screening; no polyps were found, recommended repeat colonoscopy in  5-10 years.  Colonoscopy Dr. Corinda Gubler for "routine screening" done 02/2006 found left sided diverticulosis, hemorrhoids.  No polyps. He recommended she have annual hemocult testing (?) and repeat colonoscopy in 8 years (?).   Cologuard stool testing 2017 was negative, recall at 3 years.  Colonoscopy October 2018 a single subcentimeter tubular adenoma was removed. 2. "esophageal spasm" led to EGD by Dr. Corinda Gubler 07/2000: found "tight crico" and early stricture in esophagus, dilated with 52 maloney. EGD 06/2016 DR. Christella Hartigan found benign GE junction stricture, dilated with CRE balloon to 20mm, this helped. 3. Clinical diverticulitis; known osis on 2007 colonoscopy 4. Left sided abdominal pain:  Chronic problem, underwent repeat colonoscopy in Dec 2022 to evaluate this which showed left sided diverticulosis, but no other abnormalities.     CT Abdomen/Pelvis Aug 2022 IMPRESSION: 1. Large hiatal hernia, similar to the 08/2020 examination though new compared to remote abdominal CT performed in 2010. 2. Diverticulosis without evidence of superimposed acute diverticulitis. 3. Otherwise, no explanation for patient's left lower quadrant abdominal pain. Specifically, no evidence of enteric or urinary obstruction. 4.  Aortic Atherosclerosis (ICD10-I70.0)  Colonoscopy Dec 2022 (Dr. Christella Hartigan) Impression - Diverticulosis in the left colon.  - The examination was otherwise normal on direct and retroflexion views.  - No polyps or cancers.   EGD July 2017 (Dr. Christella Hartigan) - Benign-appearing esophageal stenosis at [facility]. Dilated with 20 mm balloon.  - The examination was otherwise normal.  - No specimens collected.   Past Medical History:  Diagnosis Date   Allergy    Arthritis    Asthma    Cancer (  HCC)    basal cell cancer--nose   Cataract    left eye and stable    Cervical disc disease    bulging   Diverticulitis    Diverticulosis    GERD (gastroesophageal reflux disease)    Hyperlipidemia    Hypertension     Osteopenia    Osteoporosis    PONV (postoperative nausea and vomiting)    Status post dilation of esophageal narrowing      Past Surgical History:  Procedure Laterality Date   ABDOMINAL HYSTERECTOMY  1995   TVH--still has ovaries   ABDOMINAL SACROCOLPOPEXY  2007   Halban culdoplasty & Burch procedure--Dr. Edward Jolly   APPENDECTOMY     Dr. Daphine Deutscher   arthroscopic shoulder Left    Dr. Cleophas Dunker, bone spur   BLADDER SUSPENSION     CERVICAL FUSION     C-4, C-5 fusion   CHOLECYSTECTOMY     Dr. Lebron Conners   ESOPHAGEAL DILATION     HIP ARTHROPLASTY Left 11/07/2013   Procedure: ARTHROPLASTY BIPOLAR HIP;  Surgeon: [name], MD;  Location: [facility] OR;  Service: Orthopedics;  Laterality: Left;   KNEE ARTHROSCOPY Bilateral    Dr. Cleophas Dunker   OTHER SURGICAL HISTORY  2011   posterior Colporrhaphy with Xenform biological graft--Dr. Sissy Hoff REPAIR  2011   posterior colporrhaphy with Xenform biological graft--Dr. Edward Jolly   SHOULDER ARTHROSCOPY W/ ROTATOR CUFF REPAIR Left 11/2022   UPPER GASTROINTESTINAL ENDOSCOPY     Family History  Problem Relation Age of Onset   Hypertension Mother    Diabetes Father    Hypertension Father    Heart disease Father    Diabetes Sister    Hypertension Sister    Schizophrenia Sister    Thyroid disease Sister    Heart disease Sister    Throat cancer Paternal Uncle        smoker   Lung cancer Cousin        smoker   Colon cancer Neg Hx    Pancreatic cancer Neg Hx    Stomach cancer Neg Hx    Liver disease Neg Hx    Colon polyps Neg Hx    Esophageal cancer Neg Hx    Rectal cancer Neg Hx    Social History   Tobacco Use   Smoking status: Never   Smokeless tobacco: Never  Vaping Use   Vaping status: Never Used  Substance Use Topics   Alcohol use: Yes    Alcohol/week: 2.0 standard drinks of alcohol    Types: 2 Glasses of wine per week   Drug use: No   Current Outpatient Medications  Medication Sig Dispense Refill   Ascorbic  Acid (VITAMIN C PO) Take by mouth.     atorvastatin (LIPITOR) 10 MG tablet TAKE 1 TAKE BY MOUTH EVERY 3 DAYS 90 tablet 1   beta carotene w/minerals (OCUVITE) tablet Take 1 tablet by mouth daily.     Calcium Citrate (CITRACAL PO) Take 2 tablets by mouth daily.     cetirizine (ZYRTEC) 10 MG tablet Take 1 tablet (10 mg total) by mouth daily. 30 tablet 11   Coenzyme Q10 (COQ10) 200 MG CAPS Take 1 tablet by mouth daily.     EPIPEN 2-PAK 0.3 MG/0.3ML SOAJ injection Inject 1 mg into the muscle as needed. Reported on 06/28/2016     Ergocalciferol (VITAMIN D2) 400 units TABS Take 1 capsule by mouth daily.     esomeprazole (NEXIUM) 40 MG capsule TAKE 1 CAPSULE BY MOUTH  DAILY 90 capsule 1   fish oil-omega-3 fatty acids 1000 MG capsule Take 2 g by mouth daily.     fluticasone (FLONASE) 50 MCG/ACT nasal spray Place 2 sprays into both nostrils daily.     lactobacillus acidophilus (BACID) TABS tablet Take 2 tablets by mouth 2 (two) times a week.     Multiple Vitamin (MULTIVITAMIN) capsule Take 1 capsule by mouth daily.     PROAIR HFA 108 (90 Base) MCG/ACT inhaler INL 2 PFS PO Q 4 TO 6 H PRN  0   valsartan (DIOVAN) 80 MG tablet TAKE 1 TABLET BY MOUTH DAILY 90 tablet 1   No current facility-administered medications for this visit.   Allergies  Allergen Reactions   Evista [Raloxifene]     Respiratory infections, shortness of breath(not anaphylaxis), leg heaviness.   Amoxicillin-Pot Clavulanate Hives and Other (See Comments)   Fosamax [Alendronate] Other (See Comments)    "Due to Esophagus problems"--narrow   Other Other (See Comments)   Shellfish Allergy Hives   Singulair [Montelukast] Other (See Comments)    unknown     Review of Systems: All systems reviewed and negative except where noted in HPI.    No results found.  Physical Exam: BP 120/62   Pulse (!) 54   Ht 5\' 4"  (1.626 m)   Wt 148 lb (67.1 kg)   LMP 12/16/1993 (Approximate)   BMI 25.40 kg/m  Constitutional:  Pleasant,well-developed, Caucasian female in no acute distress.   HEENT: Normocephalic and atraumatic. Conjunctivae are normal. No scleral icterus. Neck supple.  Cardiovascular: Normal rate, regular rhythm.  Pulmonary/chest: Effort normal and breath sounds normal. No wheezing, rales or rhonchi. Abdominal: Soft, nondistended, nontender. Bowel sounds active throughout. There are no masses palpable. No hepatomegaly. Extremities: no edema Lymphadenopathy: No cervical adenopathy noted. Neurological: Alert and oriented to person place and time. Skin: Skin is warm and dry. No rashes noted. Psychiatric: Normal mood and affect. Behavior is normal.  CBC    Component Value Date/Time   WBC 3.5 (L) 08/09/2022 0929   RBC 4.38 08/09/2022 0929   HGB 13.2 08/09/2022 0929   HCT 39.1 08/09/2022 0929   PLT 207.0 08/09/2022 0929   MCV 89.2 08/09/2022 0929   MCH 30.2 11/16/2018 2338   MCHC 33.8 08/09/2022 0929   RDW 13.7 08/09/2022 0929   LYMPHSABS 1.1 08/09/2022 0929   MONOABS 0.3 08/09/2022 0929   EOSABS 0.2 08/09/2022 0929   BASOSABS 0.0 08/09/2022 0929    CMP     Component Value Date/Time   NA 139 08/09/2022 0929   NA 143 09/12/2015 0000   K 4.1 08/09/2022 0929   CL 106 08/09/2022 0929   CO2 29 08/09/2022 0929   GLUCOSE 64 (L) 08/09/2022 0929   BUN 18 08/09/2022 0929   BUN 16 09/12/2015 0000   CREATININE 0.92 08/09/2022 0929   CREATININE 0.98 (H) 06/27/2016 1501   CALCIUM 9.3 08/09/2022 0929   PROT 7.1 08/09/2022 0929   ALBUMIN 4.4 08/09/2022 0929   AST 18 08/09/2022 0929   ALT 13 08/09/2022 0929   ALKPHOS 56 08/09/2022 0929   BILITOT 0.5 08/09/2022 0929   GFRNONAA >60 11/16/2018 2338   GFRAA >60 11/16/2018 2338       Latest Ref Rng & Units 08/09/2022    9:29 AM 01/30/2022    3:08 PM 07/10/2021    1:46 PM  CBC EXTENDED  WBC 4.0 - 10.5 K/uL 3.5  5.2  4.4   RBC 3.87 - 5.11 Mil/uL 4.38  4.38  4.48   Hemoglobin 12.0 - 15.0 g/dL 95.2  84.1  32.4   HCT 36.0 - 46.0 % 39.1  38.7   39.8   Platelets 150.0 - 400.0 K/uL 207.0  224.0  208.0   NEUT# 1.4 - 7.7 K/uL 1.9  2.9  2.6   Lymph# 0.7 - 4.0 K/uL 1.1  1.6  1.4       ASSESSMENT AND PLAN:  77 year old female with longstanding history of intermittent solid dysphagia, status post dilation in 2001 and again in 2017, with recent recurrence of dysphagia without other GI symptoms or red flag symptoms.   Dysphagia Recurrent dysphagia with episodes of transient food impaction, most recently two weeks ago. Previous dilations in 2017 and over 20 years ago provided significant relief. Symptoms have recurred over the past couple of months, with occasional episodes triggered by certain foods. No significant weight changes or other gastrointestinal symptoms.  Will plan for repeat EGD with dilation.  Discussed risks, benefits, and alternatives of the procedure, including bougie or balloon dilators based on stricture location.  If no definitive stricture identified, we will plan to empirically dilate with bougie dilator. - Schedule upper endoscopy (EGD) and dilation   Hiatal Hernia Hiatal hernia noted on previous CT scan. Managed with daily Nexium for acid reflux. Explained anatomical and physiological implications, including increased risk for acid reflux and esophageal strictures. - Continue daily Nexium for acid reflux management  Colon cancer screening No recent significant weight changes or other gastrointestinal symptoms.  Most recent colonoscopy in 2022 showed no significant findings except for diverticulosis.   -No further colon cancer screening recommended.  Follow-up - Nurse to schedule EGD and dilation - Provide pre-procedure instructions.        Sheliah Hatch, MD

## 2023-10-24 ENCOUNTER — Encounter: Payer: Self-pay | Admitting: Gastroenterology

## 2023-11-06 ENCOUNTER — Encounter: Payer: Self-pay | Admitting: Gastroenterology

## 2023-11-06 ENCOUNTER — Ambulatory Visit: Payer: Medicare Other | Admitting: Gastroenterology

## 2023-11-06 VITALS — BP 125/84 | HR 56 | Temp 97.3°F | Resp 25 | Ht 64.0 in | Wt 148.0 lb

## 2023-11-06 DIAGNOSIS — K317 Polyp of stomach and duodenum: Secondary | ICD-10-CM

## 2023-11-06 DIAGNOSIS — K3189 Other diseases of stomach and duodenum: Secondary | ICD-10-CM | POA: Diagnosis not present

## 2023-11-06 DIAGNOSIS — R1310 Dysphagia, unspecified: Secondary | ICD-10-CM | POA: Diagnosis not present

## 2023-11-06 DIAGNOSIS — R1319 Other dysphagia: Secondary | ICD-10-CM

## 2023-11-06 DIAGNOSIS — Q399 Congenital malformation of esophagus, unspecified: Secondary | ICD-10-CM

## 2023-11-06 DIAGNOSIS — K449 Diaphragmatic hernia without obstruction or gangrene: Secondary | ICD-10-CM

## 2023-11-06 DIAGNOSIS — K2950 Unspecified chronic gastritis without bleeding: Secondary | ICD-10-CM | POA: Diagnosis not present

## 2023-11-06 DIAGNOSIS — K259 Gastric ulcer, unspecified as acute or chronic, without hemorrhage or perforation: Secondary | ICD-10-CM

## 2023-11-06 MED ORDER — SODIUM CHLORIDE 0.9 % IV SOLN: 500.0000 mL | INTRAVENOUS | Status: DC

## 2023-11-06 NOTE — Progress Notes (Signed)
Called to room to assist during endoscopic procedure.  Patient ID and intended procedure confirmed with present staff. Received instructions for my participation in the procedure from the performing physician.  

## 2023-11-06 NOTE — Progress Notes (Signed)
History and Physical Interval Note:  11/06/2023 1:43 PM  Sarah [name]  has presented today for endoscopic procedure(s), with the diagnosis of  Encounter Diagnosis  Name Primary?   Esophageal dysphagia Yes  .  The various methods of evaluation and treatment have been discussed with the patient and/or family. After consideration of risks, benefits and other options for treatment, the patient has consented to  the endoscopic procedure(s).   The patient's history has been reviewed, patient examined, no change in status, stable for endoscopic procedure(s).  I have reviewed the patient's chart and labs.  Questions were answered to the patient's satisfaction.     Haynes Giannotti E. Tomasa Rand, MD [facility]

## 2023-11-06 NOTE — Patient Instructions (Addendum)
Thank you for letting us take care of your healthcare needs today.     YOU HAD AN ENDOSCOPIC PROCEDURE TODAY AT [facility]:   Refer to the procedure report that was given to you for any specific questions about what was found during the examination.  If the procedure report does not answer your questions, please call your gastroenterologist to clarify.  If you requested that your care partner not be given the details of your procedure findings, then the procedure report has been included in a sealed envelope for you to review at your convenience later.  YOU SHOULD EXPECT: Some feelings of bloating in the abdomen. Passage of more gas than usual.  Walking can help get rid of the air that was put into your GI tract during the procedure and reduce the bloating. If you had a lower endoscopy (such as a colonoscopy or flexible sigmoidoscopy) you may notice spotting of blood in your stool or on the toilet paper. If you underwent a bowel prep for your procedure, you may not have a normal bowel movement for a few days.  Please Note:  You might notice some irritation and congestion in your nose or some drainage.  This is from the oxygen used during your procedure.  There is no need for concern and it should clear up in a day or so.  SYMPTOMS TO REPORT IMMEDIATELY:   Following upper endoscopy (EGD)  Vomiting of blood or coffee ground material  New chest pain or pain under the shoulder blades  Painful or persistently difficult swallowing  New shortness of breath  Fever of 100F or higher  Black, tarry-looking stools  For urgent or emergent issues, a gastroenterologist can be reached at any hour by calling [phone]. Do not use MyChart messaging for urgent concerns.    DIET:  We do recommend a small meal at first, but then you may proceed to your regular diet.  Drink plenty of fluids but you should avoid alcoholic beverages for 24 hours.  ACTIVITY:  You should plan to take it  easy for the rest of today and you should NOT DRIVE or use heavy machinery until tomorrow (because of the sedation medicines used during the test).    FOLLOW UP: Our staff will call the number listed on your records the next business day following your procedure.  We will call around 7:15- 8:00 am to check on you and address any questions or concerns that you may have regarding the information given to you following your procedure. If we do not reach you, we will leave a message.     If any biopsies were taken you will be contacted by phone or by letter within the next 1-3 weeks.  Please call us at [phone] if you have not heard about the biopsies in 3 weeks.    SIGNATURES/CONFIDENTIALITY: You and/or your care partner have signed paperwork which will be entered into your electronic medical record.  These signatures attest to the fact that that the information above on your After Visit Summary has been reviewed and is understood.  Full responsibility of the confidentiality of this discharge information lies with you and/or your care-partner.

## 2023-11-06 NOTE — Op Note (Signed)
[facility] Patient Name: Sarah [name] Procedure Date: 11/06/2023 1:45 PM MRN: [id number] Endoscopist: Lorin Picket E. Tomasa Rand , MD, [id number] Age: 77 Referring MD:  Date of Birth: 12/31/1945 Gender: Female Account #: [id number] Procedure:                Upper GI endoscopy Indications:              Dysphagia, history of response to empiric dilation Medicines:                Monitored Anesthesia Care Procedure:                Pre-Anesthesia Assessment:                           - Prior to the procedure, a History and Physical                            was performed, and patient medications and                            allergies were reviewed. The patient's tolerance of                            previous anesthesia was also reviewed. The risks                            and benefits of the procedure and the sedation                            options and risks were discussed with the patient.                            All questions were answered, and informed consent                            was obtained. Prior Anticoagulants: The patient has                            taken no anticoagulant or antiplatelet agents                            except for aspirin. ASA Grade Assessment: II - A                            patient with mild systemic disease. After reviewing                            the risks and benefits, the patient was deemed in                            satisfactory condition to undergo the procedure.                           After obtaining informed consent, the endoscope was  passed under direct vision. Throughout the                            procedure, the patient's blood pressure, pulse, and                            oxygen saturations were monitored continuously. The                            GIF [id number] #[id number] was introduced through the                            mouth, and advanced to the second part of duodenum.                             The upper GI endoscopy was accomplished without                            difficulty. The patient tolerated the procedure                            well. Scope In: Scope Out: Findings:                 The examined portions of the nasopharynx,                            oropharynx and larynx were normal.                           The distal esophagus was moderately tortuous. A                            definitive stenosis in the esophagus was not                            appreciated. A guidewire was placed and the scope                            was withdrawn. Dilation was performed with a Savary                            dilator with no resistance at 17 mm. The dilation                            site was examined following endoscope reinsertion                            and showed no change. Estimated blood loss: none.                           The exam of the esophagus was otherwise normal.                           A  large paraesophageal hernia was found.                           Few non-bleeding superficial gastric ulcers with                            pigmented material were found in the gastric                            fundus, within the paraesophageal hernia sac. The                            largest lesion was 8 mm in largest dimension.                            Biopsies were taken with a cold forceps for                            histology. Estimated blood loss was minimal.                           Multiple small sessile polyps were found in the                            gastric body. Biopsies were taken with a cold                            forceps for histology. Estimated blood loss was                            minimal.                           The exam of the stomach was otherwise normal.                           The examined duodenum was normal. Complications:            No immediate complications. Estimated Blood Loss:     Estimated  blood loss was minimal. Estimated blood                            loss was minimal. Impression:               - The examined portions of the nasopharynx,                            oropharynx and larynx were normal.                           - Tortuous esophagus. Dilated.                           - Large paraesophageal hernia.                           -  Non-bleeding gastric ulcers with pigmented                            material. Biopsied.                           - Multiple gastric polyps. Biopsied. These were                            consistent with fundic gland polyps.                           - Normal examined duodenum.                           - Query whether patient's dysphagia may be                            secondary to underlying paraesophageal hernia                            rather than esophageal stricture. If no improvement                            in swallowing following dilation, patient could                            consider hernia repair to improve symptoms. Recommendation:           - Patient has a contact number available for                            emergencies. The signs and symptoms of potential                            delayed complications were discussed with the                            patient. Return to normal activities tomorrow.                            Written discharge instructions were provided to the                            patient.                           - Resume previous diet.                           - Continue present medications.                           - Await pathology results. Jermie Hippe E. Tomasa Rand, MD 11/06/2023 2:13:05 PM This report has been signed electronically.

## 2023-11-07 ENCOUNTER — Telehealth: Payer: Self-pay | Admitting: *Deleted

## 2023-11-07 NOTE — Telephone Encounter (Signed)
  Follow up Call-     11/06/2023    1:16 PM 11/16/2021   12:39 PM  Call back number  Post procedure Call Back phone  # [phone]  Permission to leave phone message Yes Yes     Patient questions:  Do you have a fever, pain , or abdominal swelling? No. Pain Score  0 *  Have you tolerated food without any problems? Yes.    Have you been able to return to your normal activities? Yes.    Do you have any questions about your discharge instructions: Diet   No. Medications  No. Follow up visit  No.  Do you have questions or concerns about your Care? No.  Actions: * If pain score is 4 or above: No action needed, pain <4.

## 2023-11-12 LAB — SURGICAL PATHOLOGY

## 2023-11-18 NOTE — Progress Notes (Signed)
Sarah [name],  The biopsies taken from your stomach were notable for reactive gastropathy, but there was no evidence of Helicobacter pylori infection.  I suspect that the focal ulceration in your stomach was related to the hiatal hernia.  Sometimes these ulcers can lead to chronic blood loss, but there has been no evidence of anemia on review of your labs.  The polyp resected from your stomach was a benign fundic gland polyp.  There was no evidence of infection with Helicobacter pylori or intestinal metaplasia/dysplasia.  These types of polyps are typically secondary to acid suppression therapy and no specific follow-up is required for these small, benign polyps   As mentioned, some of your swallowing symptoms may be related to the large hiatal hernia.  If you did not experience any improvement in your swallowing following the dilation, and would like to discuss further evaluation or consideration for surgery repair, please let us know.

## 2024-01-28 ENCOUNTER — Other Ambulatory Visit: Payer: Self-pay | Admitting: Family Medicine

## 2024-01-28 DIAGNOSIS — Z1231 Encounter for screening mammogram for malignant neoplasm of breast: Secondary | ICD-10-CM

## 2024-02-02 ENCOUNTER — Ambulatory Visit (INDEPENDENT_AMBULATORY_CARE_PROVIDER_SITE_OTHER): Payer: Medicare Other | Admitting: Family Medicine

## 2024-02-02 VITALS — BP 100/68 | HR 98 | Ht 64.0 in | Wt 148.0 lb

## 2024-02-02 DIAGNOSIS — M7989 Other specified soft tissue disorders: Secondary | ICD-10-CM | POA: Diagnosis not present

## 2024-02-02 NOTE — Progress Notes (Signed)
   Subjective:    Patient ID: Sarah [name], female    DOB: 06-20-1946, 78 y.o.   MRN: [id number]  HPI SOB- pt was at allergist office this morning for SOB.  At some point in the conversation it came up that her R calf had been swollen.  Allergist (Dr Irena Cords) became concerned for possible DVT/PE.  Pt reports calf was swollen and sore upon waking 10-14 days ago.  Sxs resolved spontaneously.  Today at allergist, calf was TTP.  Pt denies redness or warmth.  No recent long trips.  No hx of immobility.  No hx of clot.  Takes daily ASA 81mg .   Review of Systems For ROS see HPI     Objective:   Physical Exam Vitals reviewed.  Constitutional:      General: She is not in acute distress.    Appearance: She is well-developed. She is not ill-appearing.  HENT:     Head: Normocephalic and atraumatic.  Eyes:     Extraocular Movements: Extraocular movements intact.     Pupils: Pupils are equal, round, and reactive to light.  Cardiovascular:     Rate and Rhythm: Normal rate and regular rhythm.  Pulmonary:     Effort: Pulmonary effort is normal. No tachypnea.     Breath sounds: Normal breath sounds. No decreased breath sounds, wheezing or rhonchi.  Musculoskeletal:     Right lower leg: Tenderness (over R posterior lower leg) present. No edema.     Left lower leg: No tenderness. No edema.  Skin:    General: Skin is warm and dry.     Findings: No erythema.  Neurological:     General: No focal deficit present.     Mental Status: She is alert.  Psychiatric:        Mood and Affect: Mood normal.        Behavior: Behavior normal.           Assessment & Plan:  Calf swelling- new.  Pt reports she woke up and R lower leg was swollen and tender.  The swelling resolved spontaneously but leg remains TTP.  Was seen at Allergy this morning and they were rightfully concerned about a DVT.  Will get venous doppler to assess.  If no DVT, no need to proceed w/ chest CT.  If DVT present, will get CTA  chest given recent SOB.  Pt expressed understanding and is in agreement w/ plan.

## 2024-02-02 NOTE — Patient Instructions (Signed)
Follow up as needed or as scheduled We'll call you to schedule the ultrasound HEAT for pain relief CONTINUE the aspirin daily Call with any questions or concerns HAPPY EARLY BIRTHDAY!!!

## 2024-02-03 ENCOUNTER — Ambulatory Visit (HOSPITAL_COMMUNITY)
Admission: RE | Admit: 2024-02-03 | Discharge: 2024-02-03 | Disposition: A | Discharge status: Home / Self Care | Payer: Medicare Other | Source: Ambulatory Visit | Attending: Family Medicine | Admitting: Family Medicine

## 2024-02-03 DIAGNOSIS — M7989 Other specified soft tissue disorders: Secondary | ICD-10-CM | POA: Insufficient documentation

## 2024-02-04 ENCOUNTER — Telehealth: Payer: Self-pay

## 2024-02-04 ENCOUNTER — Encounter: Payer: Self-pay | Admitting: Family Medicine

## 2024-02-04 NOTE — Telephone Encounter (Signed)
-----   Message from Neena Rhymes sent at 02/04/2024  9:11 AM EST ----- No evidence of clot.  Great news!!

## 2024-02-04 NOTE — Telephone Encounter (Signed)
 Pt has reviewed results via MyChart.

## 2024-02-18 ENCOUNTER — Ambulatory Visit: Admitting: Family Medicine

## 2024-02-18 ENCOUNTER — Encounter: Payer: Self-pay | Admitting: Family Medicine

## 2024-02-18 VITALS — BP 118/68 | HR 89 | Temp 99.9°F | Ht 64.0 in | Wt 139.5 lb

## 2024-02-18 DIAGNOSIS — B9689 Other specified bacterial agents as the cause of diseases classified elsewhere: Secondary | ICD-10-CM | POA: Diagnosis not present

## 2024-02-18 DIAGNOSIS — R051 Acute cough: Secondary | ICD-10-CM

## 2024-02-18 DIAGNOSIS — J329 Chronic sinusitis, unspecified: Secondary | ICD-10-CM | POA: Diagnosis not present

## 2024-02-18 LAB — POC INFLUENZA A&B (BINAX/QUICKVUE)
Influenza A, POC: NEGATIVE
Influenza B, POC: NEGATIVE

## 2024-02-18 MED ORDER — DOXYCYCLINE HYCLATE 100 MG PO TABS: 100.0000 mg | ORAL_TABLET | Freq: Two times a day (BID) | ORAL | 0 refills | Status: DC

## 2024-02-18 NOTE — Progress Notes (Signed)
   Subjective:    Patient ID: Sarah [name], female    DOB: 1946-08-31, 78 y.o.   MRN: [id number]  HPI URI- 'i feel like poop'.  Sxs started yesterday w/ cough.  Temp this morning was 99.9.  + body aches.  + frontal HA.  No ear pain.  Cough is somewhat productive.  Was in a crowd on Saturday but no known sick contacts.     Review of Systems For ROS see HPI     Objective:   Physical Exam Vitals reviewed.  Constitutional:      General: She is not in acute distress.    Appearance: Normal appearance. She is well-developed.  HENT:     Head: Normocephalic and atraumatic.     Right Ear: Tympanic membrane normal.     Left Ear: Tympanic membrane normal.     Nose: Mucosal edema and rhinorrhea present.     Right Sinus: Maxillary sinus tenderness and frontal sinus tenderness present.     Left Sinus: No maxillary sinus tenderness or frontal sinus tenderness.     Mouth/Throat:     Pharynx: Uvula midline. Posterior oropharyngeal erythema present. No oropharyngeal exudate.  Eyes:     Conjunctiva/sclera: Conjunctivae normal.     Pupils: Pupils are equal, round, and reactive to light.  Cardiovascular:     Rate and Rhythm: Normal rate and regular rhythm.     Heart sounds: Normal heart sounds.  Pulmonary:     Effort: Pulmonary effort is normal. No respiratory distress.     Breath sounds: Normal breath sounds. No wheezing.  Musculoskeletal:     Cervical back: Normal range of motion and neck supple.  Lymphadenopathy:     Cervical: No cervical adenopathy.  Skin:    General: Skin is warm and dry.  Neurological:     General: No focal deficit present.     Mental Status: She is alert and oriented to person, place, and time.     Cranial Nerves: No cranial nerve deficit.     Motor: No weakness.     Coordination: Coordination normal.  Psychiatric:        Mood and Affect: Mood normal.        Behavior: Behavior normal.        Thought Content: Thought content normal.           Assessment &  Plan:  Bacterial sinusitis- new.  Pt w/ exquisite TTP over R frontal and R maxillary sinuses.  She yelped on exam.  Start Doxycycline due to Augmentin allergy.  Reviewed supportive care and red flags that should prompt return.  Pt expressed understanding and is in agreement w/ plan.

## 2024-02-18 NOTE — Patient Instructions (Signed)
 Follow up as needed or as scheduled START the Doxycycline twice daily- take w/ food Drink LOTS of fluids Delsym or Mucinex DM for cough REST! Call with any questions or concerns Hang in there! HAPPY EARLY BIRTHDAY!!!

## 2024-02-25 ENCOUNTER — Ambulatory Visit: Payer: Self-pay | Admitting: Family Medicine

## 2024-02-25 ENCOUNTER — Encounter: Payer: Self-pay | Admitting: Student in an Organized Health Care Education/Training Program

## 2024-02-25 ENCOUNTER — Ambulatory Visit (INDEPENDENT_AMBULATORY_CARE_PROVIDER_SITE_OTHER): Admitting: Student in an Organized Health Care Education/Training Program

## 2024-02-25 VITALS — BP 120/80 | HR 74 | Temp 98.9°F | Wt 145.0 lb

## 2024-02-25 DIAGNOSIS — J189 Pneumonia, unspecified organism: Secondary | ICD-10-CM | POA: Insufficient documentation

## 2024-02-25 LAB — COMPREHENSIVE METABOLIC PANEL
ALT: 16 U/L (ref 0–35)
AST: 20 U/L (ref 0–37)
Albumin: 4.2 g/dL (ref 3.5–5.2)
Alkaline Phosphatase: 63 U/L (ref 39–117)
BUN: 10 mg/dL (ref 6–23)
CO2: 31 meq/L (ref 19–32)
Calcium: 9.5 mg/dL (ref 8.4–10.5)
Chloride: 101 meq/L (ref 96–112)
Creatinine, Ser: 0.97 mg/dL (ref 0.40–1.20)
GFR: 56.17 mL/min — ABNORMAL LOW (ref >60.00)
Glucose, Bld: 80 mg/dL (ref 70–99)
Potassium: 4 meq/L (ref 3.5–5.1)
Sodium: 139 meq/L (ref 135–145)
Total Bilirubin: 0.4 mg/dL (ref 0.2–1.2)
Total Protein: 7 g/dL (ref 6.0–8.3)

## 2024-02-25 LAB — CBC WITH DIFFERENTIAL/PLATELET
Basophils Absolute: 0 10*3/uL (ref 0.0–0.1)
Basophils Relative: 1.1 % (ref 0.0–3.0)
Eosinophils Absolute: 0.1 10*3/uL (ref 0.0–0.7)
Eosinophils Relative: 2.9 % (ref 0.0–5.0)
HCT: 40.6 % (ref 36.0–46.0)
Hemoglobin: 13.6 g/dL (ref 12.0–15.0)
Lymphocytes Relative: 24.8 % (ref 12.0–46.0)
Lymphs Abs: 0.8 10*3/uL (ref 0.7–4.0)
MCHC: 33.6 g/dL (ref 30.0–36.0)
MCV: 90 fl (ref 78.0–100.0)
Monocytes Absolute: 0.4 10*3/uL (ref 0.1–1.0)
Monocytes Relative: 14.1 % — ABNORMAL HIGH (ref 3.0–12.0)
Neutro Abs: 1.8 10*3/uL (ref 1.4–7.7)
Neutrophils Relative %: 57.1 % (ref 43.0–77.0)
Platelets: 200 10*3/uL (ref 150.0–400.0)
RBC: 4.51 Mil/uL (ref 3.87–5.11)
RDW: 13.5 % (ref 11.5–15.5)
WBC: 3.1 10*3/uL — ABNORMAL LOW (ref 4.0–10.5)

## 2024-02-25 NOTE — Assessment & Plan Note (Signed)
 Two weeks of URI symptoms and now progressing to have more body aches, productive cough, and fevers. Exam is reassuring, no focal crackles. She does have history of asthma, I don't think she is in exacerbation. At risk for pneumonia, will get CBC today and chest xray. She has intolerance to penicillin and doxycycline, sensitive to medication side effects, so perhaps azithromycin would be a good option for her if xray shows an infiltrate.

## 2024-02-25 NOTE — Telephone Encounter (Signed)
 Copied From CRM [id number]. Reason for Triage: Temperature of 100.4 for the past 2 days. Patient has been experiencing some head congestion and has been coughing up phlegm for the past 2 weeks.   Chief Complaint: cough Symptoms: moderate productive cough  w/clear to yellow phlegm, coughing spells w/ SOB at times Frequency: x 2 week Pertinent Negatives: Patient denies n/v Disposition: [] ED /[] Urgent Care (no appt availability in office) / [] Appointment(In office/virtual)/ []  [facility]/ [] Home Care/ [] Refused Recommended Disposition /[] [facility]/ []  Follow-up with PCP Additional Notes: c/o wheezing at times, runny nose, nasal & chest congestion, body aches & fever x 3 days 100.4.  Last office visit pt was prescribed doxycycline (VIBRA-TABS) 100 MG tablet however pt did not take medication due to its potential side effects.  Reason for Disposition  Fever present > 3 days (72 hours)  Answer Assessment - Initial Assessment Questions 1. ONSET: "When did the cough begin?"      2 week 2. SEVERITY: "How bad is the cough today?"      moderate 3. SPUTUM: "Describe the color of your sputum" (none, dry cough; clear, white, yellow, green)     Clear and yellow 4. HEMOPTYSIS: "Are you coughing up any blood?" If so ask: "How much?" (flecks, streaks, tablespoons, etc.)     no 5. DIFFICULTY BREATHING: "Are you having difficulty breathing?" If Yes, ask: "How bad is it?" (e.g., mild, moderate, severe)    - MILD: No SOB at rest, mild SOB with walking, speaks normally in sentences, can lie down, no retractions, pulse < 100.    - MODERATE: SOB at rest, SOB with minimal exertion and prefers to sit, cannot lie down flat, speaks in phrases, mild retractions, audible wheezing, pulse 100-120.    - SEVERE: Very SOB at rest, speaks in single words, struggling to breathe, sitting hunched forward, retractions, pulse > 120      SOB when coughing spells and sometimes SOB with exertion 6. FEVER: "Do  you have a fever?" If Yes, ask: "What is your temperature, how was it measured, and when did it start?"     X 3 days of temp 7. CARDIAC HISTORY: "Do you have any history of heart disease?" (e.g., heart attack, congestive heart failure)      no 8. LUNG HISTORY: "Do you have any history of lung disease?"  (e.g., pulmonary embolus, asthma, emphysema)     asthma 9. PE RISK FACTORS: "Do you have a history of blood clots?" (or: recent major surgery, recent prolonged travel, bedridden)     no 10. OTHER SYMPTOMS: "Do you have any other symptoms?" (e.g., runny nose, wheezing, chest pain)       Wheezing, runny nose, nasal and chest congestion, body aches 11. PREGNANCY: "Is there any chance you are pregnant?" "When was your last menstrual period?"       N/a 12. TRAVEL: "Have you traveled out of the country in the last month?" (e.g., travel history, exposures)       N/a  Protocols used: Cough - Acute Productive-A-AH

## 2024-02-25 NOTE — Progress Notes (Signed)
   Acute Office Visit  Subjective:     Patient ID: Sarah [name], female    DOB: September 07, 1946, 78 y.o.   MRN: [id number]  Chief Complaint  Patient presents with   Cough    Cough has been going on for a couple weeks. Has seen Dr.Tabori 02/18/2024 and was presriscbed medication but did not take the medication. Was tested for flu and covid on 02/18/2024 and all were negative    Fever    Woke up with fever and congestion. Has been going on for a couple weeks. Fevers have been off and on. Did take tylenol at 5am today.     HPI  Patient is in today for fever and cough. 78 year old woman with asthma here with progressing symptoms of myalgias, fever, and productive cough over two weeks. Started with sinus pressure and congestion. She has done very well with sinus irrigation for a few weeks, has good effect on nasal congestion. But now feeling more productive cough with yellow sputum, more body aches, low energy. Had a fever this morning to 100.4. No sick contacts. No recent antibiotics.   ROS  Eating and drinking ok, low appetite, low energy levels.      Objective:    BP 120/80   Pulse 74   Temp 98.9 F (37.2 C) (Temporal)   Wt 145 lb (65.8 kg)   LMP 12/16/1993 (Approximate)   SpO2 96%   BMI 24.89 kg/m    Physical Exam  Gen: tired appearing person Ears: cerumen present Neck: No pain, normal thyroid, no adenopathy Mouth: moderate erythema in the posterior oropharynx CV: regular, no murmur Lungs: unlabored, clear throughout, no crackles or wheezing.       Assessment & Plan:   Problem List Items Addressed This Visit       Unprioritized   Pneumonia - Primary   Two weeks of URI symptoms and now progressing to have more body aches, productive cough, and fevers. Exam is reassuring, no focal crackles. She does have history of asthma, I don't think she is in exacerbation. At risk for pneumonia, will get CBC today and chest xray. She has intolerance to penicillin and doxycycline,  sensitive to medication side effects, so perhaps azithromycin would be a good option for her if xray shows an infiltrate.       Relevant Orders   CBC with Differential/Platelet   Comprehensive metabolic panel   DG Chest 2 View    No follow-ups on file.  Tyson Alias, MD

## 2024-02-25 NOTE — Telephone Encounter (Signed)
 Pt has acute visit with Dr.Vincent -Lorain Childes

## 2024-02-26 ENCOUNTER — Ambulatory Visit (HOSPITAL_COMMUNITY)
Admission: RE | Admit: 2024-02-26 | Discharge: 2024-02-26 | Disposition: A | Discharge status: Home / Self Care | Source: Ambulatory Visit | Attending: Student in an Organized Health Care Education/Training Program | Admitting: Student in an Organized Health Care Education/Training Program

## 2024-02-26 DIAGNOSIS — J189 Pneumonia, unspecified organism: Secondary | ICD-10-CM | POA: Insufficient documentation

## 2024-02-27 ENCOUNTER — Ambulatory Visit
Admission: EM | Admit: 2024-02-27 | Discharge: 2024-02-27 | Disposition: A | Discharge status: Home / Self Care | Attending: Family Medicine | Admitting: Family Medicine

## 2024-02-27 DIAGNOSIS — J4521 Mild intermittent asthma with (acute) exacerbation: Secondary | ICD-10-CM

## 2024-02-27 DIAGNOSIS — J0100 Acute maxillary sinusitis, unspecified: Secondary | ICD-10-CM

## 2024-02-27 MED ORDER — AZELASTINE HCL 0.1 % NA SOLN: 1.0000 | Freq: Two times a day (BID) | NASAL | 0 refills | Status: DC

## 2024-02-27 MED ORDER — PROMETHAZINE-DM 6.25-15 MG/5ML PO SYRP: 5.0000 mL | ORAL_SOLUTION | Freq: Four times a day (QID) | ORAL | 0 refills | Status: DC | PRN

## 2024-02-27 MED ORDER — AZITHROMYCIN 250 MG PO TABS: ORAL_TABLET | ORAL | 0 refills | Status: DC

## 2024-02-27 MED ORDER — DEXAMETHASONE SODIUM PHOSPHATE 10 MG/ML IJ SOLN
10.0000 mg | Freq: Once | INTRAMUSCULAR | Status: AC
  Administered 2024-02-27: 10 mg via INTRAMUSCULAR

## 2024-02-27 NOTE — ED Provider Notes (Signed)
 [facility]    CSN: [id number] Arrival date & time: 02/27/24  1729      History   Chief Complaint No chief complaint on file.   HPI Sarah [name] is a 78 y.o. female.   Presenting today with progressively worsening congestion, sinus pain and pressure, fever, productive cough, wheezing, chest tightness.  Denies chest pain, shortness of breath, abdominal pain, vomiting, diarrhea.  Taking her typical allergy and asthma regimen with only mild temporary benefit.    Past Medical History:  Diagnosis Date   Allergy    Arthritis    Asthma    Cancer (HCC)    basal cell cancer--nose   Cataract    left eye and stable    Cervical disc disease    bulging   Diverticulitis    Diverticulosis    GERD (gastroesophageal reflux disease)    Hyperlipidemia    Hypertension    Osteopenia    Osteoporosis    PONV (postoperative nausea and vomiting)    Status post dilation of esophageal narrowing     Patient Active Problem List   Diagnosis Date Noted   Pneumonia 02/25/2024   Closed fracture of distal end of left radius 10/15/2023   Rib pain 09/22/2023   Trochanteric bursitis, left hip 11/05/2022   Pain in left knee 03/06/2021   Allergic rhinitis 02/20/2021   Bee allergy status 02/20/2021   Gastro-esophageal reflux disease without esophagitis 02/20/2021   Rash and other nonspecific skin eruption 02/20/2021   Seafood allergy 02/20/2021   Toxic effect of venom of bees, accidental (unintentional), subsequent encounter 02/20/2021   Pain in left leg 10/11/2020   Diverticulitis 07/09/2017   Primary osteoarthritis of both hands 12/23/2016   Primary osteoarthritis of both knees 12/23/2016   Postural kyphosis of thoracic region 12/23/2016   DJD (degenerative joint disease), cervical 12/23/2016   History of gastroesophageal reflux (GERD) 12/23/2016   History of fracture of left hip 12/23/2016   Physical exam 11/04/2016   Environmental and seasonal allergies 06/27/2016    Esophageal dysphagia 06/03/2016   Osteoporosis 08/03/2014   Hyperlipidemia 11/06/2013   Hypertension 11/06/2013    Past Surgical History:  Procedure Laterality Date   ABDOMINAL HYSTERECTOMY  1995   TVH--still has ovaries   ABDOMINAL SACROCOLPOPEXY  2007   Halban culdoplasty & Burch procedure--Dr. Edward Jolly   APPENDECTOMY     Dr. Daphine Deutscher   arthroscopic shoulder Left    Dr. Cleophas Dunker, bone spur   BLADDER SUSPENSION     CERVICAL FUSION     C-4, C-5 fusion   CHOLECYSTECTOMY     Dr. Lebron Conners   ESOPHAGEAL DILATION     HIP ARTHROPLASTY Left 11/07/2013   Procedure: ARTHROPLASTY BIPOLAR HIP;  Surgeon: [name], MD;  Location: [facility];  Service: Orthopedics;  Laterality: Left;   KNEE ARTHROSCOPY Bilateral    Dr. Cleophas Dunker   OTHER SURGICAL HISTORY  2011   posterior Colporrhaphy with Xenform biological graft--Dr. Sissy Hoff REPAIR  2011   posterior colporrhaphy with Xenform biological graft--Dr. Edward Jolly   SHOULDER ARTHROSCOPY W/ ROTATOR CUFF REPAIR Left 11/2022   UPPER GASTROINTESTINAL ENDOSCOPY      OB History     Gravida  2   Para  1   Term  1   Preterm  0   AB  1   Living  1      SAB  0   IAB  0   Ectopic  0   Multiple  Live Births               Home Medications    Prior to Admission medications   Medication Sig Start Date End Date Taking? Authorizing Provider  azelastine (ASTELIN) 0.1 % nasal spray Place 1 spray into both nostrils 2 (two) times daily. Use in each nostril as directed 02/27/24  Yes Particia Nearing, PA-C  azithromycin (ZITHROMAX) 250 MG tablet Take first 2 tablets together, then 1 every day until finished. 02/27/24  Yes Particia Nearing, PA-C  promethazine-dextromethorphan (PROMETHAZINE-DM) 6.25-15 MG/5ML syrup Take 5 mLs by mouth 4 (four) times daily as needed. 02/27/24  Yes Particia Nearing, PA-C  Ascorbic Acid (VITAMIN C PO) Take by mouth.    [provider]  aspirin (ASPIRIN 81) 81 MG  chewable tablet 81 mg.    [provider]  atorvastatin (LIPITOR) 10 MG tablet TAKE 1 TAKE BY MOUTH EVERY 3 DAYS 08/11/23   Sheliah Hatch, MD  beta carotene w/minerals (OCUVITE) tablet Take 1 tablet by mouth daily.    [provider]  Calcium Citrate (CITRACAL PO) Take 2 tablets by mouth daily.    [provider]  cetirizine (ZYRTEC) 10 MG tablet Take 1 tablet (10 mg total) by mouth daily. 11/04/16   Sheliah Hatch, MD  Coenzyme Q10 (COQ10) 200 MG CAPS Take 1 tablet by mouth daily.    [provider]  doxycycline (VIBRA-TABS) 100 MG tablet Take 1 tablet (100 mg total) by mouth 2 (two) times daily. Patient not taking: Reported on 02/25/2024 02/18/24   Sheliah Hatch, MD  EPIPEN 2-PAK 0.3 MG/0.3ML SOAJ injection Inject 1 mg into the muscle as needed. Reported on 06/28/2016 06/29/13   [provider]  Ergocalciferol (VITAMIN D2) 400 units TABS Take 1 capsule by mouth daily.    [provider]  esomeprazole (NEXIUM) 40 MG capsule TAKE 1 CAPSULE BY MOUTH DAILY 07/16/23   Sheliah Hatch, MD  fish oil-omega-3 fatty acids 1000 MG capsule Take 2 g by mouth daily.    [provider]  fluticasone (FLONASE) 50 MCG/ACT nasal spray Place 2 sprays into both nostrils daily.    [provider]  lactobacillus acidophilus (BACID) TABS tablet Take 2 tablets by mouth 2 (two) times a week.    [provider]  Multiple Vitamin (MULTIVITAMIN) capsule Take 1 capsule by mouth daily.    [provider]  PROAIR HFA 108 [phone] Base) MCG/ACT inhaler INL 2 PFS PO Q 4 TO 6 H PRN 04/18/17   [provider]  valsartan (DIOVAN) 80 MG tablet TAKE 1 TABLET BY MOUTH DAILY 10/03/23   Sheliah Hatch, MD    Family History Family History  Problem Relation Age of Onset   Hypertension Mother    Diabetes Father    Hypertension Father    Heart disease Father    Diabetes Sister    Hypertension Sister    Schizophrenia Sister     Thyroid disease Sister    Heart disease Sister    Throat cancer Paternal Uncle        smoker   Lung cancer Cousin        smoker   Colon cancer Neg Hx    Pancreatic cancer Neg Hx    Stomach cancer Neg Hx    Liver disease Neg Hx    Colon polyps Neg Hx    Esophageal cancer Neg Hx    Rectal cancer Neg Hx     Social  History Social History   Tobacco Use   Smoking status: Never   Smokeless tobacco: Never  Vaping Use   Vaping status: Never Used  Substance Use Topics   Alcohol use: Yes    Alcohol/week: 2.0 standard drinks of alcohol    Types: 2 Glasses of wine per week   Drug use: No     Allergies   Evista [raloxifene], Amoxicillin-pot clavulanate, Doxycycline, Fosamax [alendronate], Other, Shellfish allergy, and Singulair [montelukast]   Review of Systems Review of Systems Per HPI  Physical Exam Triage Vital Signs ED Triage Vitals  Encounter Vitals Group     BP 02/27/24 1920 (!) 142/84     Systolic BP Percentile --      Diastolic BP Percentile --      Pulse Rate 02/27/24 1920 74     Resp 02/27/24 1920 20     Temp 02/27/24 1920 98.5 F (36.9 C)     Temp Source 02/27/24 1920 Oral     SpO2 02/27/24 1920 93 %     Weight --      Height --      Head Circumference --      Peak Flow --      Pain Score 02/27/24 1922 0     Pain Loc --      Pain Education --      Exclude from Growth Chart --    No data found.  Updated Vital Signs BP (!) 142/84 (BP Location: Right Arm)   Pulse 74   Temp 98.5 F (36.9 C) (Oral)   Resp 20   LMP 12/16/1993 (Approximate)   SpO2 93%   Visual Acuity Right Eye Distance:   Left Eye Distance:   Bilateral Distance:    Right Eye Near:   Left Eye Near:    Bilateral Near:     Physical Exam Vitals and nursing note reviewed.  Constitutional:      Appearance: Normal appearance.  HENT:     Head: Atraumatic.     Right Ear: Tympanic membrane and external ear normal.     Left Ear: Tympanic membrane and external ear normal.      Nose: Congestion present.     Mouth/Throat:     Mouth: Mucous membranes are moist.     Pharynx: Posterior oropharyngeal erythema present.  Eyes:     Extraocular Movements: Extraocular movements intact.     Conjunctiva/sclera: Conjunctivae normal.  Cardiovascular:     Rate and Rhythm: Normal rate and regular rhythm.     Heart sounds: Normal heart sounds.  Pulmonary:     Effort: Pulmonary effort is normal.     Breath sounds: Normal breath sounds. No wheezing or rales.  Musculoskeletal:        General: Normal range of motion.     Cervical back: Normal range of motion and neck supple.  Skin:    General: Skin is warm and dry.  Neurological:     Mental Status: She is alert and oriented to person, place, and time.  Psychiatric:        Mood and Affect: Mood normal.        Thought Content: Thought content normal.      UC Treatments / Results  Labs (all labs ordered are listed, but only abnormal results are displayed) Labs Reviewed - No data to display  EKG   Radiology No results found.  Procedures Procedures (including critical care time)  Medications Ordered in UC Medications  dexamethasone (DECADRON) injection 10 mg (  has no administration in time range)    Initial Impression / Assessment and Plan / UC Course  I have reviewed the triage vital signs and the nursing notes.  Pertinent labs & imaging results that were available during my care of the patient were reviewed by me and considered in my medical decision making (see chart for details).     Duration worsening course of symptoms, will treat for sinusitis with Zithromax, Astelin, ongoing antihistamine regimen and treat asthma exacerbation with IM Decadron, albuterol as needed, Phenergan DM.  Discussed supportive over-the-counter medications, home care and return precautions.  Final Clinical Impressions(s) / UC Diagnoses   Final diagnoses:  Acute non-recurrent maxillary sinusitis  Mild intermittent asthma with  acute exacerbation   Discharge Instructions   None    ED Prescriptions     Medication Sig Dispense Auth. Provider   azithromycin (ZITHROMAX) 250 MG tablet Take first 2 tablets together, then 1 every day until finished. 6 tablet Particia Nearing, [place]   azelastine (ASTELIN) 0.1 % nasal spray Place 1 spray into both nostrils 2 (two) times daily. Use in each nostril as directed 30 mL Particia Nearing, PA-C   promethazine-dextromethorphan (PROMETHAZINE-DM) 6.25-15 MG/5ML syrup Take 5 mLs by mouth 4 (four) times daily as needed. 100 mL Particia Nearing, [place]      PDMP not reviewed this encounter.   Particia Nearing, [place] 02/27/24 1939

## 2024-02-27 NOTE — ED Triage Notes (Signed)
 Pt reports she has some head congestion, fever, and chest congestion x 1 week

## 2024-03-15 ENCOUNTER — Ambulatory Visit
Admission: RE | Admit: 2024-03-15 | Discharge: 2024-03-15 | Disposition: A | Discharge status: Home / Self Care | Payer: Medicare Other | Source: Ambulatory Visit | Attending: Family Medicine | Admitting: Family Medicine

## 2024-03-15 DIAGNOSIS — Z1231 Encounter for screening mammogram for malignant neoplasm of breast: Secondary | ICD-10-CM

## 2024-03-16 ENCOUNTER — Ambulatory Visit (INDEPENDENT_AMBULATORY_CARE_PROVIDER_SITE_OTHER): Admitting: Family Medicine

## 2024-03-16 VITALS — BP 104/68 | HR 78 | Temp 98.4°F | Wt 141.0 lb

## 2024-03-16 DIAGNOSIS — K449 Diaphragmatic hernia without obstruction or gangrene: Secondary | ICD-10-CM

## 2024-03-16 NOTE — Patient Instructions (Addendum)
 Follow up as needed or as scheduled We'll call you to schedule your GI appt Continue the Nexium daily Call with any questions or concerns Stay Safe!  Stay Healthy! Happy [birth year] Birthday!!!

## 2024-03-16 NOTE — Progress Notes (Unsigned)
   Subjective:    Patient ID: Sarah [name], female    DOB: 1946/07/19, 78 y.o.   MRN: [id number]  HPI Hiatal Hernia- pt has known hiatal hernia and was told to contact surgeon when sxs became bothersome.  At this time, she is having some difficulty w/ food passing and increased reflux.  Called Dr Tomasa Rand and was told she needed PCP referral.  Currently takes Nexium daily   Review of Systems For ROS see HPI     Objective:   Physical Exam Vitals reviewed.  Constitutional:      General: She is not in acute distress.    Appearance: Normal appearance. She is not ill-appearing.  HENT:     Head: Normocephalic and atraumatic.  Cardiovascular:     Rate and Rhythm: Normal rate and regular rhythm.  Pulmonary:     Effort: Pulmonary effort is normal. No respiratory distress.  Skin:    General: Skin is warm and dry.  Neurological:     General: No focal deficit present.     Mental Status: She is alert and oriented to person, place, and time.  Psychiatric:        Mood and Affect: Mood normal.        Behavior: Behavior normal.           Assessment & Plan:  Hiatal hernia- new to provider, ongoing for pt.  She was told she needs referral back to GI so they could refer to surgery.  She is currently on Nexium daily.  Reviewed lifestyle modifications that may be helpful.  Referral placed.

## 2024-03-18 ENCOUNTER — Encounter: Payer: Self-pay | Admitting: Family Medicine

## 2024-03-26 ENCOUNTER — Other Ambulatory Visit: Payer: Self-pay

## 2024-03-26 DIAGNOSIS — I10 Essential (primary) hypertension: Secondary | ICD-10-CM

## 2024-03-26 MED ORDER — VALSARTAN 80 MG PO TABS: ORAL_TABLET | ORAL | 1 refills | Status: DC

## 2024-04-27 NOTE — Progress Notes (Unsigned)
 se     Brigitte Canard, PA-C [address] [place], [place]  [address] Phone: [phone]   Primary Care Physician: Jess Morita, MD  Primary Gastroenterologist:  Brigitte Canard, PA-C / Darol Elizabeth, MD   Chief Complaint: Hiatal hernia, chronic dysphagia       HPI:   Sarah [name] is a 78 y.o. female returns for follow-up of chronic dysphagia.  She last saw Dr. [name] to evaluate dysphagia 10/2023.  She has history of chronic intermittent dysphagia for many years.  EGD with dilation in 2001 by Dr. Rubin Corp who noted a tight cricopharyngeus and dilated her with a 67 [place] Maloney. Her dysphagia returned, and Dr. Howard Macho performed an EGD in 2017 and noted a mild stricture at the [facility], which she dilated with a 20 mm balloon and subsequent mucosal rent. The patient states that following both of these dilations, her dysphagia was significantly improved.   10/2023 EGD by Dr. [name]: A large paraesophageal hernia was found.  A few nonbleeding superficial gastric ulcers in the gastric fundus, within the periesophageal hernia sac.  Largest ulcer 8 mm.  Multiple small benign fundic gland gastric polyps.  Torturous esophagus.  Lower esophageal stricture was dilated to 17 mm with savory dilator.  Biopsies were negative for H. pylori.  Biopsy showed reactive gastropathy and erosion.  No metaplasia or dysplasia.  11/2021 last Colonoscopy by Dr. Howard Macho: Left colon diverticulosis, otherwise normal with no polyps.  Good prep.  No further screening colonoscopies recommended.  Current Outpatient Medications  Medication Sig Dispense Refill   Ascorbic Acid (VITAMIN C PO) Take by mouth.     aspirin (ASPIRIN 81) 81 MG chewable tablet 81 mg.     atorvastatin  (LIPITOR) 10 MG tablet TAKE 1 TAKE BY MOUTH EVERY 3 DAYS 90 tablet 1   azelastine  (ASTELIN ) 0.1 % nasal spray Place 1 spray into both nostrils 2 (two) times daily. Use in each nostril as directed 30 mL 0   azithromycin  (ZITHROMAX ) 250  MG tablet Take first 2 tablets together, then 1 every day until finished. 6 tablet 0   beta carotene w/minerals (OCUVITE) tablet Take 1 tablet by mouth daily.     Calcium  Citrate (CITRACAL PO) Take 2 tablets by mouth daily.     cetirizine  (ZYRTEC ) 10 MG tablet Take 1 tablet (10 mg total) by mouth daily. 30 tablet 11   Coenzyme Q10 (COQ10) 200 MG CAPS Take 1 tablet by mouth daily.     EPIPEN  2-PAK 0.3 MG/0.3ML SOAJ injection Inject 1 mg into the muscle as needed. Reported on 06/28/2016     Ergocalciferol  (VITAMIN D2) 400 units TABS Take 1 capsule by mouth daily.     esomeprazole  (NEXIUM ) 40 MG capsule TAKE 1 CAPSULE BY MOUTH DAILY 90 capsule 1   fish oil-omega-3 fatty acids 1000 MG capsule Take 2 g by mouth daily.     fluticasone  (FLONASE ) 50 MCG/ACT nasal spray Place 2 sprays into both nostrils daily.     lactobacillus acidophilus (BACID) TABS tablet Take 2 tablets by mouth 2 (two) times a week.     Multiple Vitamin (MULTIVITAMIN) capsule Take 1 capsule by mouth daily.     PROAIR HFA 108 (90 Base) MCG/ACT inhaler INL 2 PFS PO Q 4 TO 6 H PRN  0   valsartan  (DIOVAN ) 80 MG tablet TAKE 1 TABLET BY MOUTH DAILY 90 tablet 1   No current facility-administered medications for this visit.    Allergies as of 04/28/2024 - Review Complete 03/18/2024  Allergen Reaction Noted   Evista  [raloxifene ]  10/21/2013   Amoxicillin -pot clavulanate Hives and Other (See Comments) 08/02/2013   Doxycycline   02/27/2024   Fosamax [alendronate] Other (See Comments) 09/13/2015   Other Other (See Comments) 03/23/2021   Shellfish allergy Hives 05/23/2021   Singulair  [montelukast ] Other (See Comments) 10/16/2018    Past Medical History:  Diagnosis Date   Allergy    Arthritis    Asthma    Cancer (HCC)    basal cell cancer--nose   Cataract    left eye and stable    Cervical disc disease    bulging   Diverticulitis    Diverticulosis    GERD (gastroesophageal reflux disease)    Hyperlipidemia    Hypertension     Osteopenia    Osteoporosis    PONV (postoperative nausea and vomiting)    Status post dilation of esophageal narrowing     Past Surgical History:  Procedure Laterality Date   ABDOMINAL HYSTERECTOMY  1995   TVH--still has ovaries   ABDOMINAL SACROCOLPOPEXY  2007   Halban culdoplasty & Burch procedure--Dr. Colvin Dec   APPENDECTOMY     Dr. Gaylyn Keas   arthroscopic shoulder Left    Dr. Aviva Lemmings, bone spur   BLADDER SUSPENSION     CERVICAL FUSION     C-4, C-5 fusion   CHOLECYSTECTOMY     Dr. Rockey Church   ESOPHAGEAL DILATION     HIP ARTHROPLASTY Left 11/07/2013   Procedure: ARTHROPLASTY BIPOLAR HIP;  Surgeon: Shirlee Dotter, MD;  Location: [facility] OR;  Service: Orthopedics;  Laterality: Left;   KNEE ARTHROSCOPY Bilateral    Dr. Aviva Lemmings   OTHER SURGICAL HISTORY  2011   posterior Colporrhaphy with Xenform biological graft--Dr. Risa Cheney REPAIR  2011   posterior colporrhaphy with Xenform biological graft--Dr. Colvin Dec   SHOULDER ARTHROSCOPY W/ ROTATOR CUFF REPAIR Left 11/2022   UPPER GASTROINTESTINAL ENDOSCOPY      Review of Systems:    All systems reviewed and negative except where noted in HPI.    Physical Exam:  LMP 12/16/1993 (Approximate)  Patient's last menstrual period was 12/16/1993 (approximate).  General: Well-nourished, well-developed in no acute distress.  Lungs: Clear to auscultation bilaterally. Non-labored. Heart: Regular rate and rhythm, no murmurs rubs or gallops.  Abdomen: Bowel sounds are normal; Abdomen is Soft; No hepatosplenomegaly, masses or hernias;  No Abdominal Tenderness; No guarding or rebound tenderness. Neuro: Alert and oriented x 3.  Grossly intact.  Psych: Alert and cooperative, normal mood and affect.   Imaging Studies: No results found.  Labs: CBC    Component Value Date/Time   WBC 3.1 (L) 02/25/2024 1113   RBC 4.51 02/25/2024 1113   HGB 13.6 02/25/2024 1113   HCT 40.6 02/25/2024 1113   PLT 200.0 02/25/2024 1113   MCV 90.0  02/25/2024 1113   MCH 30.2 11/16/2018 2338   MCHC 33.6 02/25/2024 1113   RDW 13.5 02/25/2024 1113   LYMPHSABS 0.8 02/25/2024 1113   MONOABS 0.4 02/25/2024 1113   EOSABS 0.1 02/25/2024 1113   BASOSABS 0.0 02/25/2024 1113    CMP     Component Value Date/Time   NA 139 02/25/2024 1113   NA 143 09/12/2015 0000   K 4.0 02/25/2024 1113   CL 101 02/25/2024 1113   CO2 31 02/25/2024 1113   GLUCOSE 80 02/25/2024 1113   BUN 10 02/25/2024 1113   BUN 16 09/12/2015 0000   CREATININE 0.97 02/25/2024 1113   CREATININE 0.98 (H) 06/27/2016 1501  CALCIUM  9.5 02/25/2024 1113   PROT 7.0 02/25/2024 1113   ALBUMIN  4.2 02/25/2024 1113   AST 20 02/25/2024 1113   ALT 16 02/25/2024 1113   ALKPHOS 63 02/25/2024 1113   BILITOT 0.4 02/25/2024 1113   GFRNONAA >60 11/16/2018 2338   GFRAA >60 11/16/2018 2338       Assessment and Plan:   ANNETTIE MULLAN is a 78 y.o. y/o female returns for follow-up of chronic dysphagia.  Recent EGD showed distal esophageal stricture which was dilated.  Also showed large paraesophageal hernia and erosive gastropathy.  Biopsies negative for H. pylori.  1.  Lower esophageal stricture s/p dilation to 17 mm -Barium Swallow with Tablet - If recurrent stricture, then schedule EGD w/ DIL  2.  Large paraesophageal hernia - Refer to a surgeon to discuss surgical repair  3.  Erosive gastropathy (H. pylori negative) - Continue PPI - Avoid NSAIDs    Brigitte Canard, PA-C  Follow up ***

## 2024-04-28 ENCOUNTER — Encounter: Payer: Self-pay | Admitting: Physician Assistant

## 2024-04-28 ENCOUNTER — Ambulatory Visit (INDEPENDENT_AMBULATORY_CARE_PROVIDER_SITE_OTHER): Admitting: Physician Assistant

## 2024-04-28 VITALS — BP 100/60 | HR 72 | Ht 64.0 in | Wt 146.1 lb

## 2024-04-28 DIAGNOSIS — K259 Gastric ulcer, unspecified as acute or chronic, without hemorrhage or perforation: Secondary | ICD-10-CM

## 2024-04-28 DIAGNOSIS — R1310 Dysphagia, unspecified: Secondary | ICD-10-CM

## 2024-04-28 DIAGNOSIS — Z8719 Personal history of other diseases of the digestive system: Secondary | ICD-10-CM | POA: Diagnosis not present

## 2024-04-28 DIAGNOSIS — K449 Diaphragmatic hernia without obstruction or gangrene: Secondary | ICD-10-CM | POA: Diagnosis not present

## 2024-04-28 DIAGNOSIS — K319 Disease of stomach and duodenum, unspecified: Secondary | ICD-10-CM | POA: Diagnosis not present

## 2024-04-28 DIAGNOSIS — R1319 Other dysphagia: Secondary | ICD-10-CM

## 2024-04-28 DIAGNOSIS — K2100 Gastro-esophageal reflux disease with esophagitis, without bleeding: Secondary | ICD-10-CM

## 2024-04-28 MED ORDER — ESOMEPRAZOLE MAGNESIUM 40 MG PO CPDR: DELAYED_RELEASE_CAPSULE | ORAL | 3 refills | Status: DC

## 2024-04-28 NOTE — Patient Instructions (Signed)
 We have sent the following medications to your pharmacy for you to pick up at your convenience: Esomeprazole  40 mg daily  We have sent a surgical referral to [facility] they will be giving you a call to schedule an appointment.  You have been scheduled for a Barium Esophogram at [facility] (1st floor of the hospital) on 04/30/24 at 10:30 am. Please arrive 30 minutes prior to your appointment for registration. Make certain not to have anything to eat or drink 3 hours prior to your test. If you need to reschedule for any reason, please contact radiology at [phone] to do so. __________________________________________________________________ A barium swallow is an examination that concentrates on views of the esophagus. This tends to be a double contrast exam (barium and two liquids which, when combined, create a gas to distend the wall of the oesophagus) or single contrast (non-ionic iodine based). The study is usually tailored to your symptoms so a good history is essential. Attention is paid during the study to the form, structure and configuration of the esophagus, looking for functional disorders (such as aspiration, dysphagia, achalasia, motility and reflux) EXAMINATION You may be asked to change into a gown, depending on the type of swallow being performed. A radiologist and radiographer will perform the procedure. The radiologist will advise you of the type of contrast selected for your procedure and direct you during the exam. You will be asked to stand, sit or lie in several different positions and to hold a small amount of fluid in your mouth before being asked to swallow while the imaging is performed .In some instances you may be asked to swallow barium coated marshmallows to assess the motility of a solid food bolus. The exam can be recorded as a digital or video fluoroscopy procedure. POST PROCEDURE It will take 1-2 days for the barium to pass through your system.  To facilitate this, it is important, unless otherwise directed, to increase your fluids for the next 24-48hrs and to resume your normal diet.  This test typically takes about 30 minutes to perform. ___________________________________________________________________________  Please follow up sooner if symptoms increase or worsen  Due to recent changes in healthcare laws, you may see the results of your imaging and laboratory studies on MyChart before your provider has had a chance to review them.  We understand that in some cases there may be results that are confusing or concerning to you. Not all laboratory results come back in the same time frame and the provider may be waiting for multiple results in order to interpret others.  Please give us  48 hours in order for your provider to thoroughly review all the results before contacting the office for clarification of your results.   _______________________________________________________  If your blood pressure at your visit was 140/90 or greater, please contact your primary care physician to follow up on this.  _______________________________________________________  If you are age 78 or older, your body mass index should be between 23-30. Your Body mass index is 25.08 kg/m. If this is out of the aforementioned range listed, please consider follow up with your Primary Care Provider.  If you are age 67 or younger, your body mass index should be between 19-25. Your Body mass index is 25.08 kg/m. If this is out of the aformentioned range listed, please consider follow up with your Primary Care Provider.   ________________________________________________________  The [facility] providers would like to encourage you to use MYCHART to communicate with providers for non-urgent requests  or questions.  Due to long hold times on the telephone, sending your provider a message by [facility] may be a faster and more efficient way to get a response.  Please  allow 48 business hours for a response.  Please remember that this is for non-urgent requests.  _______________________________________________________ Thank you for trusting me with your gastrointestinal care!   Brigitte Canard, PA

## 2024-04-29 ENCOUNTER — Encounter: Payer: Self-pay | Admitting: Physician Assistant

## 2024-04-30 ENCOUNTER — Ambulatory Visit (HOSPITAL_COMMUNITY)
Admission: RE | Admit: 2024-04-30 | Discharge: 2024-04-30 | Disposition: A | Discharge status: Home / Self Care | Source: Ambulatory Visit | Attending: Physician Assistant | Admitting: Physician Assistant

## 2024-04-30 DIAGNOSIS — K2100 Gastro-esophageal reflux disease with esophagitis, without bleeding: Secondary | ICD-10-CM | POA: Diagnosis present

## 2024-04-30 DIAGNOSIS — R1319 Other dysphagia: Secondary | ICD-10-CM | POA: Insufficient documentation

## 2024-04-30 DIAGNOSIS — K259 Gastric ulcer, unspecified as acute or chronic, without hemorrhage or perforation: Secondary | ICD-10-CM | POA: Insufficient documentation

## 2024-04-30 DIAGNOSIS — K449 Diaphragmatic hernia without obstruction or gangrene: Secondary | ICD-10-CM | POA: Insufficient documentation

## 2024-05-03 ENCOUNTER — Ambulatory Visit: Payer: Self-pay | Admitting: Physician Assistant

## 2024-05-03 DIAGNOSIS — K2100 Gastro-esophageal reflux disease with esophagitis, without bleeding: Secondary | ICD-10-CM

## 2024-05-03 DIAGNOSIS — K259 Gastric ulcer, unspecified as acute or chronic, without hemorrhage or perforation: Secondary | ICD-10-CM

## 2024-05-03 MED ORDER — ESOMEPRAZOLE MAGNESIUM 40 MG PO CPDR: 40.0000 mg | DELAYED_RELEASE_CAPSULE | Freq: Two times a day (BID) | ORAL | 1 refills | Status: DC

## 2024-05-03 MED ORDER — ESOMEPRAZOLE MAGNESIUM 40 MG PO CPDR: 40.0000 mg | DELAYED_RELEASE_CAPSULE | Freq: Two times a day (BID) | ORAL | 1 refills | Status: AC

## 2024-05-03 NOTE — Telephone Encounter (Signed)
 Pt informed of results and recommendations to take nexium  40 mg take 1 capsule twice daily, referral sent to general surgeon for hiatal hernia repair consult, after repair will discuss decreasing nexium  back to once daily. Pt verbalized understanding. I will follow-up with surgery consult appt in a couple of weeks. Pt verbalized understanding.

## 2024-05-03 NOTE — Progress Notes (Signed)
 Call and notify patient barium swallow with tablet showed: 1.  Large paraesophageal hiatal hernia. 2.  Esophageal dysmotility (abnormal muscle contractions of the esophagus). 3.  Moderate gastroesophageal reflux to the mid upper esophagus. 4.  No evidence of esophageal stricture or masses.  Repeat EGD is not necessary. **I recommend continue with plan to follow-up with general surgeon to discuss hiatal hernia repair.  Referral already done.  I recommend increase Nexium  to 40 mg 1 tablet twice daily, #60, 5 refills.  After hiatal hernia repair surgery, then we can decrease Nexium  again. Brigitte Canard, PA-C

## 2024-05-14 NOTE — Progress Notes (Signed)
 Agree with the assessment and plan as outlined by Brigitte Canard, PA-C.  Given large hernia and lack of improvement with dilation, suspect her symptoms are secondary to the hernia itself.  Agree with surgical referral.

## 2024-05-17 ENCOUNTER — Other Ambulatory Visit (HOSPITAL_COMMUNITY)

## 2024-05-21 ENCOUNTER — Encounter: Payer: Self-pay | Admitting: Family Medicine

## 2024-06-14 ENCOUNTER — Encounter: Payer: Self-pay | Admitting: Family Medicine

## 2024-06-14 NOTE — Telephone Encounter (Signed)
 Please call patient to schedule pre-surgical clarence

## 2024-06-15 NOTE — Telephone Encounter (Signed)
 [facility] did not require a surgical clearance from Dr. Mahlon. They stated she will be provided with medication instructions during her pre-op visit so she does not need an appointment with us 

## 2024-06-17 ENCOUNTER — Ambulatory Visit: Admitting: Family Medicine

## 2024-06-24 ENCOUNTER — Other Ambulatory Visit: Payer: Self-pay | Admitting: Family Medicine

## 2024-06-24 DIAGNOSIS — I10 Essential (primary) hypertension: Secondary | ICD-10-CM

## 2024-06-29 ENCOUNTER — Ambulatory Visit: Payer: Self-pay | Admitting: Surgery

## 2024-07-06 NOTE — Patient Instructions (Signed)
 SURGICAL WAITING ROOM VISITATION  Patients having surgery or a procedure may have no more than 2 support people in the waiting area - these visitors may rotate.    Children under the age of 16 must have an adult with them who is not the patient.  Visitors with respiratory illnesses are discouraged from visiting and should remain at home.  If the patient needs to stay at the hospital during part of their recovery, the visitor guidelines for inpatient rooms apply. Pre-op nurse will coordinate an appropriate time for 1 support person to accompany patient in pre-op.  This support person may not rotate.    Please refer to the [facility] website for the visitor guidelines for Inpatients (after your surgery is over and you are in a regular room).       Your procedure is scheduled on: 07/16/24   Report to [facility] Main Entrance    Report to admitting at : 7:15 AM   Call this number if you have problems the morning of surgery [phone]   Do not eat food :After Midnight.   After Midnight you may have the following liquids until : 6:30 AM DAY OF SURGERY  Water Non-Citrus Juices (without pulp, NO RED-Apple, White grape, White cranberry) Black Coffee (NO MILK/CREAM OR CREAMERS, sugar ok)  Clear Tea (NO MILK/CREAM OR CREAMERS, sugar ok) regular and decaf                             Plain Jell-O (NO RED)                                           Fruit ices (not with fruit pulp, NO RED)                                     Popsicles (NO RED)                                                               Sports drinks like Gatorade (NO RED)              FOLLOW ANY ADDITIONAL PRE OP INSTRUCTIONS YOU RECEIVED FROM YOUR SURGEON'S OFFICE!!!   Oral Hygiene is also important to reduce your risk of infection.                                    Remember - BRUSH YOUR TEETH THE MORNING OF SURGERY WITH YOUR REGULAR TOOTHPASTE  DENTURES WILL BE REMOVED PRIOR TO SURGERY PLEASE DO NOT APPLY  Poly grip OR ADHESIVES!!!   Do NOT smoke after Midnight   Stop all vitamins and herbal supplements 7 days before surgery.   Take these medicines the morning of surgery with A SIP OF WATER: esomeprazole .Cetirizine  as needed.Use inhalers as usual and bring them.  DO NOT TAKE ANY ORAL DIABETIC MEDICATIONS DAY OF YOUR SURGERY  Bring CPAP mask and tubing day of surgery.  You may not have any metal on your body including hair pins, jewelry, and body piercing             Do not wear make-up, lotions, powders, perfumes/cologne, or deodorant  Do not wear nail polish including gel and S&S, artificial/acrylic nails, or any other type of covering on natural nails including finger and toenails. If you have artificial nails, gel coating, etc. that needs to be removed by a nail salon please have this removed prior to surgery or surgery may need to be canceled/ delayed if the surgeon/ anesthesia feels like they are unable to be safely monitored.   Do not shave  48 hours prior to surgery.    Do not bring valuables to the hospital. [facility] IS NOT             RESPONSIBLE   FOR VALUABLES.   Contacts, glasses, dentures or bridgework may not be worn into surgery.   Bring small overnight bag day of surgery.   DO NOT BRING YOUR HOME MEDICATIONS TO THE HOSPITAL. PHARMACY WILL DISPENSE MEDICATIONS LISTED ON YOUR MEDICATION LIST TO YOU DURING YOUR ADMISSION IN THE HOSPITAL!    Patients discharged on the day of surgery will not be allowed to drive home.  Someone NEEDS to stay with you for the first 24 hours after anesthesia.   Special Instructions: Bring a copy of your healthcare power of attorney and living will documents the day of surgery if you haven't scanned them before.              Please read over the following fact sheets you were given: IF YOU HAVE QUESTIONS ABOUT YOUR PRE-OP INSTRUCTIONS PLEASE CALL [phone].   If you received a COVID test during your pre-op  visit  it is requested that you wear a mask when out in public, stay away from anyone that may not be feeling well and notify your surgeon if you develop symptoms. If you test positive for Covid or have been in contact with anyone that has tested positive in the last 10 days please notify you surgeon.     - Preparing for Surgery Before surgery, you can play an important role.  Because skin is not sterile, your skin needs to be as free of germs as possible.  You can reduce the number of germs on your skin by washing with CHG (chlorahexidine gluconate) soap before surgery.  CHG is an antiseptic cleaner which kills germs and bonds with the skin to continue killing germs even after washing. Please DO NOT use if you have an allergy to CHG or antibacterial soaps.  If your skin becomes reddened/irritated stop using the CHG and inform your nurse when you arrive at Short Stay. Do not shave (including legs and underarms) for at least 48 hours prior to the first CHG shower.  You may shave your face/neck. Please follow these instructions carefully:  1.  Shower with CHG Soap the night before surgery and the  morning of Surgery.  2.  If you choose to wash your hair, wash your hair first as usual with your  normal  shampoo.  3.  After you shampoo, rinse your hair and body thoroughly to remove the  shampoo.                           4.  Use CHG as you would any other liquid soap.  You can apply chg directly  to  the skin and wash                       Gently with a scrungie or clean washcloth.  5.  Apply the CHG Soap to your body ONLY FROM THE NECK DOWN.   Do not use on face/ open                           Wound or open sores. Avoid contact with eyes, ears mouth and genitals (private parts).                       Wash face,  Genitals (private parts) with your normal soap.             6.  Wash thoroughly, paying special attention to the area where your surgery  will be performed.  7.  Thoroughly rinse your  body with warm water from the neck down.  8.  DO NOT shower/wash with your normal soap after using and rinsing off  the CHG Soap.                9.  Pat yourself dry with a clean towel.            10.  Wear clean pajamas.            11.  Place clean sheets on your bed the night of your first shower and do not  sleep with pets. Day of Surgery : Do not apply any lotions/deodorants the morning of surgery.  Please wear clean clothes to the hospital/surgery center.  FAILURE TO FOLLOW THESE INSTRUCTIONS MAY RESULT IN THE CANCELLATION OF YOUR SURGERY PATIENT SIGNATURE_________________________________  NURSE SIGNATURE__________________________________  ________________________________________________________________________

## 2024-07-07 ENCOUNTER — Encounter (HOSPITAL_COMMUNITY)
Admission: RE | Admit: 2024-07-07 | Discharge: 2024-07-07 | Disposition: A | Discharge status: Home / Self Care | Source: Ambulatory Visit | Attending: Surgery | Admitting: Surgery

## 2024-07-07 ENCOUNTER — Other Ambulatory Visit: Payer: Self-pay

## 2024-07-07 ENCOUNTER — Encounter (HOSPITAL_COMMUNITY): Payer: Self-pay

## 2024-07-07 VITALS — BP 121/76 | HR 64 | Temp 97.7°F | Ht 65.0 in | Wt 144.0 lb

## 2024-07-07 DIAGNOSIS — I10 Essential (primary) hypertension: Secondary | ICD-10-CM | POA: Insufficient documentation

## 2024-07-07 DIAGNOSIS — Z01818 Encounter for other preprocedural examination: Secondary | ICD-10-CM | POA: Diagnosis present

## 2024-07-07 LAB — BASIC METABOLIC PANEL WITH GFR
Anion gap: 12 (ref 5–15)
BUN: 14 mg/dL (ref 8–23)
CO2: 23 mmol/L (ref 22–32)
Calcium: 9.3 mg/dL (ref 8.9–10.3)
Chloride: 107 mmol/L (ref 98–111)
Creatinine, Ser: 0.85 mg/dL (ref 0.44–1.00)
GFR, Estimated: >60 mL/min (ref >60)
Glucose, Bld: 84 mg/dL (ref 70–99)
Potassium: 4.1 mmol/L (ref 3.5–5.1)
Sodium: 142 mmol/L (ref 135–145)

## 2024-07-07 LAB — CBC
HCT: 41.4 % (ref 36.0–46.0)
Hemoglobin: 13.3 g/dL (ref 12.0–15.0)
MCH: 29 pg (ref 26.0–34.0)
MCHC: 32.1 g/dL (ref 30.0–36.0)
MCV: 90.2 fL (ref 80.0–100.0)
Platelets: 206 K/uL (ref 150–400)
RBC: 4.59 MIL/uL (ref 3.87–5.11)
RDW: 13.2 % (ref 11.5–15.5)
WBC: 3.7 K/uL — ABNORMAL LOW (ref 4.0–10.5)
nRBC: 0 % (ref 0.0–0.2)

## 2024-07-07 NOTE — Progress Notes (Signed)
 For Anesthesia: PCP - Mahlon Comer BRAVO, MD  Cardiologist - N/A  Bowel Prep reminder:N/A  Chest x-ray -  EKG - 07/07/24 Stress Test -  ECHO -  Cardiac Cath -  Pacemaker/ICD device last checked: Pacemaker orders received: Device Rep notified:  Spinal Cord Stimulator:N/A  Sleep Study - N/A CPAP -   Fasting Blood Sugar - N/A Checks Blood Sugar _____ times a day Date and result of last Hgb A1c-  Last dose of GLP1 agonist- N/A GLP1 instructions:   Last dose of SGLT-2 inhibitors- N/A SGLT-2 instructions:   Blood Thinner Instructions:N/A Aspirin Instructions:No instructions Last Dose:  Activity level: Can go up a flight of stairs and activities of daily living without stopping and without chest pain and/or shortness of breath   Able to exercise without chest pain and/or shortness of breath  Anesthesia review: Hx: HTN  Patient denies shortness of breath, fever, cough and chest pain at PAT appointment   Patient verbalized understanding of instructions that were reviewed over the telephone.

## 2024-07-16 ENCOUNTER — Encounter (HOSPITAL_COMMUNITY)
Admission: RE | Disposition: A | Discharge status: Home / Self Care | Payer: Self-pay | Source: Ambulatory Visit | Attending: Surgery

## 2024-07-16 ENCOUNTER — Encounter (HOSPITAL_COMMUNITY)

## 2024-07-16 ENCOUNTER — Inpatient Hospital Stay (HOSPITAL_COMMUNITY)

## 2024-07-16 ENCOUNTER — Other Ambulatory Visit: Payer: Self-pay

## 2024-07-16 ENCOUNTER — Encounter (HOSPITAL_COMMUNITY): Payer: Self-pay | Admitting: Surgery

## 2024-07-16 ENCOUNTER — Inpatient Hospital Stay (HOSPITAL_COMMUNITY)
Admission: RE | Admit: 2024-07-16 | Discharge: 2024-07-17 | DRG: 328 | Disposition: A | Discharge status: Home / Self Care | Source: Ambulatory Visit | Attending: Surgery | Admitting: Surgery

## 2024-07-16 DIAGNOSIS — Z79899 Other long term (current) drug therapy: Secondary | ICD-10-CM | POA: Diagnosis not present

## 2024-07-16 DIAGNOSIS — Z96642 Presence of left artificial hip joint: Secondary | ICD-10-CM | POA: Diagnosis present

## 2024-07-16 DIAGNOSIS — Z818 Family history of other mental and behavioral disorders: Secondary | ICD-10-CM

## 2024-07-16 DIAGNOSIS — Z880 Allergy status to penicillin: Secondary | ICD-10-CM

## 2024-07-16 DIAGNOSIS — I10 Essential (primary) hypertension: Secondary | ICD-10-CM | POA: Diagnosis present

## 2024-07-16 DIAGNOSIS — Z833 Family history of diabetes mellitus: Secondary | ICD-10-CM

## 2024-07-16 DIAGNOSIS — E785 Hyperlipidemia, unspecified: Secondary | ICD-10-CM | POA: Diagnosis present

## 2024-07-16 DIAGNOSIS — K449 Diaphragmatic hernia without obstruction or gangrene: Secondary | ICD-10-CM

## 2024-07-16 DIAGNOSIS — Z8349 Family history of other endocrine, nutritional and metabolic diseases: Secondary | ICD-10-CM | POA: Diagnosis not present

## 2024-07-16 DIAGNOSIS — Z7982 Long term (current) use of aspirin: Secondary | ICD-10-CM

## 2024-07-16 DIAGNOSIS — J45909 Unspecified asthma, uncomplicated: Secondary | ICD-10-CM | POA: Diagnosis present

## 2024-07-16 DIAGNOSIS — M810 Age-related osteoporosis without current pathological fracture: Secondary | ICD-10-CM | POA: Diagnosis present

## 2024-07-16 DIAGNOSIS — Z808 Family history of malignant neoplasm of other organs or systems: Secondary | ICD-10-CM

## 2024-07-16 DIAGNOSIS — Z91013 Allergy to seafood: Secondary | ICD-10-CM | POA: Diagnosis not present

## 2024-07-16 DIAGNOSIS — Z981 Arthrodesis status: Secondary | ICD-10-CM | POA: Diagnosis not present

## 2024-07-16 DIAGNOSIS — R1314 Dysphagia, pharyngoesophageal phase: Secondary | ICD-10-CM | POA: Diagnosis present

## 2024-07-16 DIAGNOSIS — K224 Dyskinesia of esophagus: Secondary | ICD-10-CM | POA: Diagnosis present

## 2024-07-16 DIAGNOSIS — Z8249 Family history of ischemic heart disease and other diseases of the circulatory system: Secondary | ICD-10-CM | POA: Diagnosis not present

## 2024-07-16 DIAGNOSIS — K219 Gastro-esophageal reflux disease without esophagitis: Secondary | ICD-10-CM | POA: Diagnosis present

## 2024-07-16 DIAGNOSIS — Z888 Allergy status to other drugs, medicaments and biological substances status: Secondary | ICD-10-CM | POA: Diagnosis not present

## 2024-07-16 DIAGNOSIS — Z85828 Personal history of other malignant neoplasm of skin: Secondary | ICD-10-CM | POA: Diagnosis not present

## 2024-07-16 DIAGNOSIS — Z801 Family history of malignant neoplasm of trachea, bronchus and lung: Secondary | ICD-10-CM | POA: Diagnosis not present

## 2024-07-16 HISTORY — PX: XI ROBOTIC ASSISTED HIATAL HERNIA REPAIR: SHX6889

## 2024-07-16 LAB — CBC
HCT: 36.9 % (ref 36.0–46.0)
Hemoglobin: 12.3 g/dL (ref 12.0–15.0)
MCH: 29.9 pg (ref 26.0–34.0)
MCHC: 33.3 g/dL (ref 30.0–36.0)
MCV: 89.6 fL (ref 80.0–100.0)
Platelets: 183 K/uL (ref 150–400)
RBC: 4.12 MIL/uL (ref 3.87–5.11)
RDW: 12.9 % (ref 11.5–15.5)
WBC: 9.2 K/uL (ref 4.0–10.5)
nRBC: 0 % (ref 0.0–0.2)

## 2024-07-16 LAB — CREATININE, SERUM
Creatinine, Ser: 1.09 mg/dL — ABNORMAL HIGH (ref 0.44–1.00)
GFR, Estimated: 52 mL/min — ABNORMAL LOW (ref >60)

## 2024-07-16 SURGERY — REPAIR, HERNIA, HIATAL, ROBOT-ASSISTED
Anesthesia: General

## 2024-07-16 MED ORDER — LIDOCAINE HCL (CARDIAC) PF 100 MG/5ML IV SOSY
PREFILLED_SYRINGE | INTRAVENOUS | Status: DC | PRN
  Administered 2024-07-16: 60 mg via INTRAVENOUS

## 2024-07-16 MED ORDER — BUPIVACAINE-EPINEPHRINE 0.25% -1:200000 IJ SOLN
INTRAMUSCULAR | Status: DC | PRN
  Administered 2024-07-16: 30 mL

## 2024-07-16 MED ORDER — FENTANYL CITRATE PF 50 MCG/ML IJ SOSY: 25.0000 ug | PREFILLED_SYRINGE | INTRAMUSCULAR | Status: DC | PRN

## 2024-07-16 MED ORDER — ONDANSETRON HCL 4 MG/2ML IJ SOLN
INTRAMUSCULAR | Status: AC
  Filled 2024-07-16: qty 2

## 2024-07-16 MED ORDER — HEPARIN SODIUM (PORCINE) 5000 UNIT/ML IJ SOLN
5000.0000 [IU] | Freq: Once | INTRAMUSCULAR | Status: AC
  Administered 2024-07-16: 5000 [IU] via SUBCUTANEOUS
  Filled 2024-07-16: qty 1

## 2024-07-16 MED ORDER — ALBUMIN HUMAN 5 % IV SOLN
INTRAVENOUS | Status: AC
  Filled 2024-07-16: qty 250

## 2024-07-16 MED ORDER — DOCUSATE SODIUM 100 MG PO CAPS
100.0000 mg | ORAL_CAPSULE | Freq: Two times a day (BID) | ORAL | Status: DC
  Administered 2024-07-16 – 2024-07-17 (×2): 100 mg via ORAL
  Filled 2024-07-16 (×2): qty 1

## 2024-07-16 MED ORDER — PANTOPRAZOLE SODIUM 40 MG PO TBEC
40.0000 mg | DELAYED_RELEASE_TABLET | Freq: Every day | ORAL | Status: DC
  Administered 2024-07-17: 40 mg via ORAL
  Filled 2024-07-16: qty 1

## 2024-07-16 MED ORDER — SUGAMMADEX SODIUM 200 MG/2ML IV SOLN
INTRAVENOUS | Status: DC | PRN
  Administered 2024-07-16: 150 mg via INTRAVENOUS

## 2024-07-16 MED ORDER — ONDANSETRON HCL 4 MG/2ML IJ SOLN
INTRAMUSCULAR | Status: DC | PRN
  Administered 2024-07-16: 4 mg via INTRAVENOUS

## 2024-07-16 MED ORDER — DEXAMETHASONE SODIUM PHOSPHATE 10 MG/ML IJ SOLN
INTRAMUSCULAR | Status: DC | PRN
Start: 2024-07-16 — End: 2024-07-16
  Administered 2024-07-16: 8 mg via INTRAVENOUS

## 2024-07-16 MED ORDER — ENOXAPARIN SODIUM 40 MG/0.4ML IJ SOSY
40.0000 mg | PREFILLED_SYRINGE | INTRAMUSCULAR | Status: DC
  Administered 2024-07-17: 40 mg via SUBCUTANEOUS
  Filled 2024-07-16: qty 0.4

## 2024-07-16 MED ORDER — DEXAMETHASONE SODIUM PHOSPHATE 10 MG/ML IJ SOLN
INTRAMUSCULAR | Status: AC
  Filled 2024-07-16: qty 1

## 2024-07-16 MED ORDER — KETAMINE HCL 50 MG/5ML IJ SOSY
PREFILLED_SYRINGE | INTRAMUSCULAR | Status: AC
  Filled 2024-07-16: qty 5

## 2024-07-16 MED ORDER — CHLORHEXIDINE GLUCONATE CLOTH 2 % EX PADS: 6.0000 | MEDICATED_PAD | Freq: Once | CUTANEOUS | Status: DC

## 2024-07-16 MED ORDER — AMISULPRIDE (ANTIEMETIC) 5 MG/2ML IV SOLN: 10.0000 mg | Freq: Once | INTRAVENOUS | Status: DC | PRN

## 2024-07-16 MED ORDER — KETAMINE HCL 50 MG/5ML IJ SOSY
PREFILLED_SYRINGE | INTRAMUSCULAR | Status: DC | PRN
Start: 2024-07-16 — End: 2024-07-16
  Administered 2024-07-16: 20 mg via INTRAVENOUS

## 2024-07-16 MED ORDER — IRBESARTAN 75 MG PO TABS
75.0000 mg | ORAL_TABLET | Freq: Every day | ORAL | Status: DC
  Administered 2024-07-17: 75 mg via ORAL
  Filled 2024-07-16: qty 1

## 2024-07-16 MED ORDER — PROPOFOL 1000 MG/100ML IV EMUL
INTRAVENOUS | Status: AC
  Filled 2024-07-16: qty 100

## 2024-07-16 MED ORDER — GABAPENTIN 300 MG PO CAPS
300.0000 mg | ORAL_CAPSULE | Freq: Three times a day (TID) | ORAL | Status: DC
  Administered 2024-07-16 – 2024-07-17 (×3): 300 mg via ORAL
  Filled 2024-07-16: qty 3
  Filled 2024-07-16 (×2): qty 1

## 2024-07-16 MED ORDER — ALBUMIN HUMAN 5 % IV SOLN: INTRAVENOUS | Status: DC | PRN

## 2024-07-16 MED ORDER — PHENYLEPHRINE 80 MCG/ML (10ML) SYRINGE FOR IV PUSH (FOR BLOOD PRESSURE SUPPORT)
PREFILLED_SYRINGE | INTRAVENOUS | Status: DC | PRN
Start: 2024-07-16 — End: 2024-07-16
  Administered 2024-07-16 (×2): 160 ug via INTRAVENOUS
  Administered 2024-07-16: 80 ug via INTRAVENOUS
  Administered 2024-07-16: 160 ug via INTRAVENOUS

## 2024-07-16 MED ORDER — LACTATED RINGERS IV SOLN: INTRAVENOUS | Status: DC | PRN

## 2024-07-16 MED ORDER — SUGAMMADEX SODIUM 200 MG/2ML IV SOLN
INTRAVENOUS | Status: AC
  Filled 2024-07-16: qty 2

## 2024-07-16 MED ORDER — ONDANSETRON HCL 4 MG/2ML IJ SOLN: 4.0000 mg | Freq: Four times a day (QID) | INTRAMUSCULAR | Status: DC | PRN

## 2024-07-16 MED ORDER — GABAPENTIN 300 MG PO CAPS
300.0000 mg | ORAL_CAPSULE | ORAL | Status: AC
  Administered 2024-07-16: 300 mg via ORAL
  Filled 2024-07-16: qty 1

## 2024-07-16 MED ORDER — DEXMEDETOMIDINE HCL IN NACL 80 MCG/20ML IV SOLN
INTRAVENOUS | Status: AC
  Filled 2024-07-16: qty 20

## 2024-07-16 MED ORDER — FENTANYL CITRATE (PF) 250 MCG/5ML IJ SOLN
INTRAMUSCULAR | Status: DC | PRN
  Administered 2024-07-16 (×2): 25 ug via INTRAVENOUS

## 2024-07-16 MED ORDER — PHENYLEPHRINE HCL-NACL 20-0.9 MG/250ML-% IV SOLN
INTRAVENOUS | Status: DC | PRN
  Administered 2024-07-16: 40 ug/min via INTRAVENOUS

## 2024-07-16 MED ORDER — PHENYLEPHRINE 80 MCG/ML (10ML) SYRINGE FOR IV PUSH (FOR BLOOD PRESSURE SUPPORT)
PREFILLED_SYRINGE | INTRAVENOUS | Status: AC
  Filled 2024-07-16: qty 10

## 2024-07-16 MED ORDER — ACETAMINOPHEN 10 MG/ML IV SOLN
1000.0000 mg | Freq: Four times a day (QID) | INTRAVENOUS | Status: AC
  Administered 2024-07-16 – 2024-07-17 (×4): 1000 mg via INTRAVENOUS
  Filled 2024-07-16 (×4): qty 100

## 2024-07-16 MED ORDER — PROPOFOL 10 MG/ML IV BOLUS
INTRAVENOUS | Status: DC | PRN
Start: 2024-07-16 — End: 2024-07-16
  Administered 2024-07-16: 130 mg via INTRAVENOUS

## 2024-07-16 MED ORDER — ORAL CARE MOUTH RINSE: 15.0000 mL | Freq: Once | OROMUCOSAL | Status: AC

## 2024-07-16 MED ORDER — LIDOCAINE HCL (PF) 2 % IJ SOLN
INTRAMUSCULAR | Status: AC
  Filled 2024-07-16: qty 20

## 2024-07-16 MED ORDER — ASPIRIN 81 MG PO TBEC
81.0000 mg | DELAYED_RELEASE_TABLET | Freq: Every morning | ORAL | Status: DC
  Administered 2024-07-17: 81 mg via ORAL
  Filled 2024-07-16: qty 1

## 2024-07-16 MED ORDER — 0.9 % SODIUM CHLORIDE (POUR BTL) OPTIME
TOPICAL | Status: DC | PRN
  Administered 2024-07-16: 1000 mL

## 2024-07-16 MED ORDER — ROCURONIUM BROMIDE 100 MG/10ML IV SOLN
INTRAVENOUS | Status: DC | PRN
  Administered 2024-07-16: 10 mg via INTRAVENOUS
  Administered 2024-07-16: 5 mg via INTRAVENOUS
  Administered 2024-07-16: 50 mg via INTRAVENOUS

## 2024-07-16 MED ORDER — METHOCARBAMOL 1000 MG/10ML IJ SOLN: 500.0000 mg | Freq: Four times a day (QID) | INTRAMUSCULAR | Status: DC | PRN

## 2024-07-16 MED ORDER — CHLORHEXIDINE GLUCONATE 0.12 % MT SOLN
15.0000 mL | Freq: Once | OROMUCOSAL | Status: AC
  Administered 2024-07-16: 15 mL via OROMUCOSAL

## 2024-07-16 MED ORDER — ACETAMINOPHEN 500 MG PO TABS
1000.0000 mg | ORAL_TABLET | ORAL | Status: AC
  Administered 2024-07-16: 1000 mg via ORAL
  Filled 2024-07-16: qty 2

## 2024-07-16 MED ORDER — LIDOCAINE HCL (PF) 2 % IJ SOLN
INTRAMUSCULAR | Status: DC | PRN
  Administered 2024-07-16: 1.5 mg/kg/h via INTRADERMAL

## 2024-07-16 MED ORDER — ATORVASTATIN CALCIUM 10 MG PO TABS
10.0000 mg | ORAL_TABLET | ORAL | Status: DC
  Administered 2024-07-17: 10 mg via ORAL
  Filled 2024-07-16: qty 1

## 2024-07-16 MED ORDER — PROPOFOL 10 MG/ML IV BOLUS
INTRAVENOUS | Status: AC
  Filled 2024-07-16: qty 20

## 2024-07-16 MED ORDER — LACTATED RINGERS IV SOLN: INTRAVENOUS | Status: DC

## 2024-07-16 MED ORDER — FENTANYL CITRATE (PF) 250 MCG/5ML IJ SOLN
INTRAMUSCULAR | Status: AC
Start: 2024-07-16 — End: 2024-07-16
  Filled 2024-07-16: qty 5

## 2024-07-16 MED ORDER — PROCHLORPERAZINE EDISYLATE 10 MG/2ML IJ SOLN: 10.0000 mg | INTRAMUSCULAR | Status: DC | PRN

## 2024-07-16 MED ORDER — PROPOFOL 500 MG/50ML IV EMUL
INTRAVENOUS | Status: DC | PRN
  Administered 2024-07-16: 150 ug/kg/min via INTRAVENOUS
  Administered 2024-07-16: 100 ug/kg/min via INTRAVENOUS

## 2024-07-16 MED ORDER — LIDOCAINE HCL (PF) 2 % IJ SOLN
INTRAMUSCULAR | Status: AC
  Filled 2024-07-16: qty 5

## 2024-07-16 MED ORDER — PROPOFOL 500 MG/50ML IV EMUL
INTRAVENOUS | Status: AC
  Filled 2024-07-16: qty 50

## 2024-07-16 MED ORDER — CEFAZOLIN SODIUM-DEXTROSE 2-4 GM/100ML-% IV SOLN
2.0000 g | INTRAVENOUS | Status: AC
  Administered 2024-07-16: 2 g via INTRAVENOUS
  Filled 2024-07-16: qty 100

## 2024-07-16 MED ORDER — ROCURONIUM BROMIDE 10 MG/ML (PF) SYRINGE
PREFILLED_SYRINGE | INTRAVENOUS | Status: AC
Start: 2024-07-16 — End: 2024-07-16
  Filled 2024-07-16: qty 10

## 2024-07-16 SURGICAL SUPPLY — 59 items
BAG COUNTER SPONGE SURGICOUNT (BAG) ×1 IMPLANT
BLADE SURG SZ11 CARB STEEL (BLADE) ×1 IMPLANT
CHLORAPREP W/TINT 26 (MISCELLANEOUS) ×1 IMPLANT
CLIP APPLIE 5 13 M/L LIGAMAX5 (MISCELLANEOUS) IMPLANT
CLIP APPLIE ROT 10 11.4 M/L (STAPLE) IMPLANT
COVER SURGICAL LIGHT HANDLE (MISCELLANEOUS) ×1 IMPLANT
COVER TIP SHEARS 8 DVNC (MISCELLANEOUS) IMPLANT
DEFOGGER SCOPE WARM SEASHARP (MISCELLANEOUS) IMPLANT
DERMABOND ADVANCED .7 DNX12 (GAUZE/BANDAGES/DRESSINGS) ×1 IMPLANT
DRAIN PENROSE 0.5X18 (DRAIN) ×1 IMPLANT
DRAPE ARM DVNC X/XI (DISPOSABLE) ×4 IMPLANT
DRAPE COLUMN DVNC XI (DISPOSABLE) ×1 IMPLANT
DRIVER NDL LRG 8 DVNC XI (INSTRUMENTS) ×1 IMPLANT
DRIVER NDL MEGA SUTCUT DVNCXI (INSTRUMENTS) ×1 IMPLANT
DRIVER NDLE LRG 8 DVNC XI (INSTRUMENTS) ×1 IMPLANT
DRIVER NDLE MEGA SUTCUT DVNCXI (INSTRUMENTS) ×1 IMPLANT
ELECT REM PT RETURN 15FT ADLT (MISCELLANEOUS) ×1 IMPLANT
ENDOLOOP SUT PDS II 0 18 (SUTURE) IMPLANT
GAUZE 4X4 16PLY ~~LOC~~+RFID DBL (SPONGE) ×1 IMPLANT
GLOVE BIO SURGEON STRL SZ7.5 (GLOVE) ×3 IMPLANT
GLOVE INDICATOR 8.0 STRL GRN (GLOVE) ×3 IMPLANT
GOWN STRL REUS W/ TWL XL LVL3 (GOWN DISPOSABLE) ×2 IMPLANT
GRASPER SUT TROCAR 14GX15 (MISCELLANEOUS) ×1 IMPLANT
GRASPER TIP-UP FEN DVNC XI (INSTRUMENTS) ×1 IMPLANT
IRRIGATION SUCT STRKRFLW 2 WTP (MISCELLANEOUS) IMPLANT
KIT BASIN OR (CUSTOM PROCEDURE TRAY) ×1 IMPLANT
KIT TURNOVER KIT A (KITS) ×1 IMPLANT
LUBRICANT JELLY K Y 4OZ (MISCELLANEOUS) IMPLANT
MARKER SKIN DUAL TIP RULER LAB (MISCELLANEOUS) ×1 IMPLANT
MESH BIO-A 7X10 SYN MAT (Mesh General) IMPLANT
NDL HYPO 22X1.5 SAFETY MO (MISCELLANEOUS) ×1 IMPLANT
NDL INSUFFLATION 14GA 120MM (NEEDLE) IMPLANT
NEEDLE HYPO 22X1.5 SAFETY MO (MISCELLANEOUS) ×1 IMPLANT
NEEDLE INSUFFLATION 14GA 120MM (NEEDLE) ×1 IMPLANT
PACK CARDIOVASCULAR III (CUSTOM PROCEDURE TRAY) ×1 IMPLANT
PAD POSITIONING PINK XL (MISCELLANEOUS) ×1 IMPLANT
POUCH RETRIEVAL ECOSAC 10 (ENDOMECHANICALS) IMPLANT
RETRACTOR GRSP SML 8 DVNC XI (INSTRUMENTS) ×1 IMPLANT
SCISSORS LAP 5X35 DISP (ENDOMECHANICALS) IMPLANT
SCISSORS MNPLR CVD DVNC XI (INSTRUMENTS) ×1 IMPLANT
SEAL UNIV 5-12 XI (MISCELLANEOUS) ×3 IMPLANT
SEALER VESSEL EXT DVNC XI (MISCELLANEOUS) ×1 IMPLANT
SET TUBE SMOKE EVAC HIGH FLOW (TUBING) ×1 IMPLANT
SOLUTION ANTFG W/FOAM PAD STRL (MISCELLANEOUS) ×1 IMPLANT
SOLUTION ELECTROSURG ANTI STCK (MISCELLANEOUS) ×1 IMPLANT
SPIKE FLUID TRANSFER (MISCELLANEOUS) ×1 IMPLANT
SUT ETHIBOND 0 36 GRN (SUTURE) ×2 IMPLANT
SUT MNCRL AB 4-0 PS2 18 (SUTURE) ×1 IMPLANT
SUT SILK 0 SH 30 (SUTURE) IMPLANT
SUT SILK 2 0 SH (SUTURE) IMPLANT
SUT VICRYL 0 TIES 12 18 (SUTURE) IMPLANT
SYR 20ML LL LF (SYRINGE) ×1 IMPLANT
TIP INNERVISION DETACH 40FR (MISCELLANEOUS) IMPLANT
TIP INNERVISION DETACH 50FR (MISCELLANEOUS) IMPLANT
TIP INNERVISION DETACH 56FR (MISCELLANEOUS) IMPLANT
TOWEL OR 17X26 10 PK STRL BLUE (TOWEL DISPOSABLE) ×1 IMPLANT
TRAY FOLEY MTR SLVR 16FR STAT (SET/KITS/TRAYS/PACK) IMPLANT
TROCAR ADV FIXATION 5X100MM (TROCAR) IMPLANT
TROCAR BALLN 12MMX100 BLUNT (TROCAR) IMPLANT

## 2024-07-16 NOTE — H&P (Signed)
 Admitting Physician: Deward PARAS Fenix Rorke  Service: General Surgery  CC: General surgery  Subjective   HPI: Notes from the office: Sarah [name] is a 78 year old female with a hiatal hernia who presents with dysphagia and vomiting. She is accompanied by her husband, Sarah [name].  She experiences dysphagia, described as a sensation of food 'stacking up' in her esophagus, occurring multiple times weekly. This sensation is not accompanied by heartburn, and acid reflux into the throat is rare. She avoids certain foods like pizza and dry bread to manage her symptoms.  She has a hiatal hernia that causes pain and vomiting, particularly after eating. Vomiting provides relief from the discomfort. She recalls an episode where she felt sick and vomited after eating chicken, which led to the discovery of her condition.  She has undergone upper endoscopy procedures where her esophagus was dilated, which provided some relief but did not completely resolve her swallowing issues. The last dilation was approximately seven to eight months ago.  Her current management includes lifestyle modifications such as chewing food thoroughly, drinking liquids between bites, and eating slowly to alleviate swallowing difficulties.  She has a history of allergies and allergy-related asthma, which she believes contribute to her breathing difficulties. No heartburn, and rare occurrences of acid washing up into her throat. No fever or symptoms suggestive of food poisoning during vomiting episodes.     Past Medical History:  Diagnosis Date   Allergy    Arthritis    Asthma    Cancer (HCC)    basal cell cancer--nose   Cataract    left eye and stable    Cervical disc disease    bulging   Diverticulitis    Diverticulosis    GERD (gastroesophageal reflux disease)    Hyperlipidemia    Hypertension    Osteopenia    Osteoporosis    PONV (postoperative nausea and vomiting)    Status post dilation of esophageal  narrowing     Past Surgical History:  Procedure Laterality Date   ABDOMINAL HYSTERECTOMY  1995   TVH--still has ovaries   ABDOMINAL SACROCOLPOPEXY  2007   Halban culdoplasty & Burch procedure--Dr. Nikki   APPENDECTOMY     Dr. Gladis   arthroscopic shoulder Left    Dr. Anderson, bone spur   BLADDER SUSPENSION     CERVICAL FUSION     C-4, C-5 fusion   CHOLECYSTECTOMY     Dr. Elsie Sink   ESOPHAGEAL DILATION     HIP ARTHROPLASTY Left 11/07/2013   Procedure: ARTHROPLASTY BIPOLAR HIP;  Surgeon: Maude LELON Anderson, MD;  Location: [facility] OR;  Service: Orthopedics;  Laterality: Left;   KNEE ARTHROSCOPY Bilateral    Dr. Anderson   OTHER SURGICAL HISTORY  2011   posterior Colporrhaphy with Xenform biological graft--Dr. Nikki DOWNY REPAIR  2011   posterior colporrhaphy with Xenform biological graft--Dr. Nikki   SHOULDER ARTHROSCOPY W/ ROTATOR CUFF REPAIR Left 11/2022   UPPER GASTROINTESTINAL ENDOSCOPY      Family History  Problem Relation Age of Onset   Hypertension Mother    Diabetes Father    Hypertension Father    Heart disease Father    Diabetes Sister    Hypertension Sister    Schizophrenia Sister    Thyroid  disease Sister    Heart disease Sister    Throat cancer Paternal Uncle        smoker   Lung cancer Cousin        smoker   Colon  cancer Neg Hx    Pancreatic cancer Neg Hx    Stomach cancer Neg Hx    Liver disease Neg Hx    Colon polyps Neg Hx    Esophageal cancer Neg Hx    Rectal cancer Neg Hx     Social:  reports that she has never smoked. She has never used smokeless tobacco. She reports current alcohol use of about 2.0 standard drinks of alcohol per week. She reports that she does not use drugs.  Allergies:  Allergies  Allergen Reactions   Evista  [Raloxifene ]     Respiratory infections, shortness of breath(not anaphylaxis), leg heaviness.   Amoxicillin -Pot Clavulanate Hives and Other (See Comments)   Doxycycline      Not allergic just doesn't want  to take it due to possible side effects   Fosamax [Alendronate] Other (See Comments)    Due to Esophagus problems--narrow   Shellfish Allergy Hives   Singulair  [Montelukast ] Other (See Comments)    unknown    Medications: Current Outpatient Medications  Medication Instructions   Ascorbic Acid (VITAMIN C PO) 1 tablet, Oral, Every morning   aspirin EC 81 mg, Oral, Every morning   atorvastatin  (LIPITOR) 10 MG tablet TAKE 1 TAKE BY MOUTH EVERY 3 DAYS   B Complex-C (B-COMPLEX WITH VITAMIN C) tablet 1 tablet, Oral, Every morning   cetirizine  (ZYRTEC ) 10 mg, Oral, Daily   Cholecalciferol  (VITAMIN D3 PO) 1 tablet, Oral, Every morning   clobetasol  ointment (TEMOVATE ) 0.05 % 1 Application, 2 times daily PRN   Coenzyme Q10 (COQ10 PO) 1 capsule, Oral, 3D (72 hours)   docusate sodium  (COLACE) 100 mg, Oral, At bedtime PRN   EpiPen  2-Pak 0.3 mg, Intramuscular, As needed, Reported on 06/28/2016   esomeprazole  (NEXIUM ) 40 mg, Oral, 2 times daily before meals   fluticasone  (FLONASE ) 50 MCG/ACT nasal spray 2 sprays, Each Nare, Daily PRN   Multiple Vitamin (MULTIVITAMIN WITH MINERALS) TABS tablet 1 tablet, Oral, Every morning   Multiple Vitamins-Minerals (CITRACAL +D3 PO) 1 tablet, Oral, Every morning   Multiple Vitamins-Minerals (OCUVITE PO) 1 tablet, Oral, Every evening   Omega 3-6-9 Fatty Acids (TRIPLE OMEGA-3-6-9 PO) 1 capsule, Oral, Every evening   PROAIR HFA 108 (90 Base) MCG/ACT inhaler 1-2 puffs, Inhalation, Every 6 hours PRN   Probiotic TABS 1 tablet, Oral, Every morning   TURMERIC PO 1,000 mg, Oral, Every morning   valsartan  (DIOVAN ) 80 mg, Oral, Daily    ROS - all of the below systems have been reviewed with the patient and positives are indicated with bold text General: chills, fever or night sweats Eyes: blurry vision or double vision ENT: epistaxis or sore throat Allergy/Immunology: itchy/watery eyes or nasal congestion Hematologic/Lymphatic: bleeding problems, blood clots or swollen  lymph nodes Endocrine: temperature intolerance or unexpected weight changes Breast: new or changing breast lumps or nipple discharge Resp: cough, shortness of breath, or wheezing CV: chest pain or dyspnea on exertion GI: as per HPI GU: dysuria, trouble voiding, or hematuria MSK: joint pain or joint stiffness Neuro: TIA or stroke symptoms Derm: pruritus and skin lesion changes Psych: anxiety and depression  Objective   PE Last menstrual period 12/16/1993. Constitutional: NAD; conversant; no deformities Eyes: Moist conjunctiva; no lid lag; anicteric; PERRL Neck: Trachea midline; no thyromegaly Lungs: Normal respiratory effort; no tactile fremitus CV: RRR; no palpable thrills; no pitting edema GI: Abd Soft, nontender; no palpable hepatosplenomegaly MSK: Normal range of motion of extremities; no clubbing/cyanosis Psychiatric: Appropriate affect; alert and oriented x3 Lymphatic: No palpable cervical  or axillary lymphadenopathy  No results found for this or any previous visit (from the past 24 hours).  Imaging Orders  No imaging studies ordered today  Esophagram 04/30/24 IMPRESSION: 1. Large paraesophageal hiatal hernia  2. Esophageal dysmotility with poor primary peristaltic wave and tertiary contractions.  3. Moderate gastroesophageal reflux, to level the midthoracic esophagus.  CT abd/Pel 08/06/21 IMPRESSION: 1. Large hiatal hernia, similar to the 08/2020 examination though new compared to remote abdominal CT performed in 2010. 2. Diverticulosis without evidence of superimposed acute diverticulitis. 3. Otherwise, no explanation for patient's left lower quadrant abdominal pain. Specifically, no evidence of enteric or urinary obstruction. 4. Aortic Atherosclerosis (ICD10-I70.0).  EGD 11/06/23 - The examined portions of the nasopharynx, oropharynx and larynx were normal.  - Tortuous esophagus. Dilated. - Large paraesophageal hernia.  - Non- bleeding gastric ulcers with  pigmented material. Biopsied.  - Multiple gastric polyps. Biopsied. These were consistent with fundic gland polyps.  - Normal examined duodenum.  - Query whether patient' s dysphagia may be secondary to underlying paraesophageal hernia rather than esophageal stricture. If no improvement in swallowing following dilation, patient could consider hernia repair to improve symptoms.    Assessment and Plan   ALVENIA TREESE is an 78 y.o. female with hiatal hernia with dysphagia  CT, EGD, and esophagram reviewed above, large hiatal hernia containing majority of the stomach and some associated esophageal dysmotility.   I recommended robotic hiatal hernia repair with absorbable mesh. We discussed the procedure, its risks, benefits and alternatives and the patient granted consent to proceed. Our surgery scheduler will reach out to the patient to schedule surgery.  Deward JINNY Foy, MD  [facility] Use [email or web address] to contact on call provider

## 2024-07-16 NOTE — Op Note (Signed)
 Patient: Sarah [name] (1946-03-29, [id number])  Date of Surgery: 07/16/2024  Preoperative Diagnosis: HIATAL HERNIA   Postoperative Diagnosis: HIATAL HERNIA   Surgical Procedure: Robotic hiatal hernia repair with BIO-A mesh placement   Operative Team Members:  Surgeons and Role:    * Dora Simeone, Deward PARAS, MD - Primary   Anesthesiologist: Lucious Debby BRAVO, MD CRNA: Landy Chip HERO, CRNA; Therisa Doyal CROME, CRNA   Anesthesia: General   Fluids:  Total I/O In: 812 [I.V.:462; IV Piggyback:350] Out: 25 [Blood:25]  Complications: * No complications entered in OR log *  Drains:  none   Specimen: * No specimens in log *   Disposition:  PACU - hemodynamically stable.  Plan of Care: Admit to inpatient   Indications for Procedure: Sarah [name] is a 78 y.o. female who presented with a massive hiatal hernia with regurgitation and dysphagia.  I recommended robotic hiatal hernia repair with BIO-A mesh placement.  We discussed the procedure, its risks, benefits and alternatives and the patient granted consent to proceed.  Findings: Massive hiatal hernia.  Description of Procedure:   The patient was positioned supine, padded and secured to the bed.  The abdomen was widely prepped and draped.  A time out procedure was performed.   The abdomen was inflated at Palmer's point units seen of the Veress needle.  Four 8 mm trocars were placed across the mid abdomen and the [facility] liver retractor was placed through the Palmer's point incision.  This was all done under direct vision with out any trauma to the underlying viscera.  The patient was placed in reverse Trendelenburg positioning and the [employer] robot system was docked.  I used the vessel sealer, Mega suture cut needle driver, tip up fenestrated grasper, and fenestrated bipolar grasper throughout the case.  Beginning at the twelve o'clock position on the crus, the hernia sac was grasped and reduced form the mediastinum.  The  hernia sac was divided to enter the plane between the sac and the mediastinum.  The hernia sac was divided towards the left and right with continued traction on the hernia sac to reduce it.  A mediastinal dissection was performed to further reduce the hernia sac which facilitated reduction of the stomach as well.  The hernia sac was disconnected from the right and left crura and subsequently, the hernia sac was dissected from the stomach and esophagus, and excised.  The short gastric vessels were divided from the inferior pole of the spleen, superiorly, to completely disconnect the stomach from the spleen and the left hemidiaphragm.  All posterior gastric attachments to the lesser sac and retroperitoneum were divided.  The left crus was further delineated.  A retroesophageal window was created for retraction with the tip up grasper.   A high, circumferential mediastinal dissection was performed in an effort to mobilize the esophagus and provide for adequate intraabdominal esophageal length.  The mediastinal dissection was performed bluntly, with little to no thermal energy.  The anterior and posterior vagus nerves were both identified and preserved.  The crural defect was reapproximated with multiple, interrupted, 0 Ethibond sutures posteriorly.  The defect was quite large.  Using a 0 Ethibond suture I gathered the anterior diaphragm tissue by taking running horizontal mattress sutures of the diaphragm from the right crus around anteriorly to the left crus.  This catheter the diaphragm tissues around the esophagus and help close the defect anteriorly.  There is also provided circumferential closure of the hiatal defect.  After the anterior  tissues were gathered using an Ethibond suture a few additional Ethibond sutures were placed posteriorly to appropriately sized the hiatal hernia closure..  The crural pillars came together well without tearing of the adjacent diaphragmatic tissue.     I elected not to  perform a fundoplication as the patient's main complaint preoperatively was dysphagia and regurgitation.  A piece of Bio-A mesh was brought into the abdomen and sutured in 3 places to reinforce the crural closure.  It was sutured at the right anterior left anterior locations on the crural closure as well as to the crus itself posteriorly.  The operative field was inspected for hemostasis.  The robot was undocked and the laparoscope was inserted into the peritoneal cavity to visualize the removal of the liver retractor.  The hernia sac was removed in a Endo Catch bag.  The left most incision was closed at the fascial level using a 0 Vicryl suture in a PMI suture passer as the hernia sac had been removed through this location.  The other 8 mm trocars were not closed at the fascial level.  The peritoneal cavity was desufflated and the trocars removed.  The incisions were closed with 4-0 Monocryl subcuticular sutures and skin glue.     Deward Foy, MD General, Bariatric, & Minimally Invasive Surgery [facility], [place]

## 2024-07-16 NOTE — Anesthesia Postprocedure Evaluation (Signed)
 Anesthesia Post Note  Patient: Sarah [name]  Procedure(s) Performed: REPAIR, HERNIA, HIATAL, ROBOT-ASSISTED     Patient location during evaluation: PACU Anesthesia Type: General Level of consciousness: awake and alert Pain management: pain level controlled Vital Signs Assessment: post-procedure vital signs reviewed and stable Respiratory status: spontaneous breathing, nonlabored ventilation and respiratory function stable Cardiovascular status: stable and blood pressure returned to baseline Anesthetic complications: no   No notable events documented.  Last Vitals:  Vitals:   07/16/24 1358 07/16/24 1525  BP: (!) 150/59 (!) 145/60  Pulse: (!) 46 (!) 48  Resp: 14 16  Temp: 36.6 C   SpO2: 90% 94%    Last Pain:  Vitals:   07/16/24 1358  TempSrc: Oral  PainSc: 3                  Debby FORBES Like

## 2024-07-16 NOTE — Anesthesia Preprocedure Evaluation (Addendum)
 Anesthesia Evaluation  Patient identified by MRN, date of birth, ID band Patient awake    Reviewed: Allergy & Precautions, NPO status , Patient's Chart, lab work & pertinent test results  History of Anesthesia Complications (+) PONV and history of anesthetic complications  Airway Mallampati: III  TM Distance: >3 FB Neck ROM: Full    Dental  (+) Dental Advisory Given   Pulmonary asthma    Pulmonary exam normal        Cardiovascular hypertension, Pt. on medications Normal cardiovascular exam     Neuro/Psych negative neurological ROS  negative psych ROS   GI/Hepatic Neg liver ROS, hiatal hernia,GERD  Medicated and Controlled,,  Endo/Other  negative endocrine ROS    Renal/GU negative Renal ROS     Musculoskeletal  (+) Arthritis ,    Abdominal   Peds  Hematology negative hematology ROS (+)   Anesthesia Other Findings   Reproductive/Obstetrics                              Anesthesia Physical Anesthesia Plan  ASA: 2  Anesthesia Plan: General   Post-op Pain Management: Tylenol  PO (pre-op)* and Ketamine IV*   Induction: Intravenous  PONV Risk Score and Plan: 4 or greater and Treatment may vary due to age or medical condition, Ondansetron  and TIVA  Airway Management Planned: Oral ETT  Additional Equipment: None  Intra-op Plan:   Post-operative Plan: Extubation in OR  Informed Consent: I have reviewed the patients History and Physical, chart, labs and discussed the procedure including the risks, benefits and alternatives for the proposed anesthesia with the patient or authorized representative who has indicated his/her understanding and acceptance.     Dental advisory given  Plan Discussed with: CRNA and Anesthesiologist  Anesthesia Plan Comments:          Anesthesia Quick Evaluation

## 2024-07-16 NOTE — Anesthesia Procedure Notes (Addendum)
 Procedure Name: Intubation Date/Time: 07/16/2024 10:10 AM  Performed by: Lucious Debby BRAVO, MDPre-anesthesia Checklist: Patient identified, Emergency Drugs available, Suction available and Patient being monitored Patient Re-evaluated:Patient Re-evaluated prior to induction Oxygen Delivery Method: Circle System Utilized Preoxygenation: Pre-oxygenation with 100% oxygen Induction Type: IV induction Ventilation: Mask ventilation without difficulty Laryngoscope Size: Miller and 2 Grade View: Grade II Tube type: Oral Tube size: 7.0 mm Number of attempts: 1 Airway Equipment and Method: Stylet Placement Confirmation: ETT inserted through vocal cords under direct vision, positive ETCO2 and breath sounds checked- equal and bilateral Tube secured with: Tape Dental Injury: Teeth and Oropharynx as per pre-operative assessment  Comments: 1st DVL attempt with MAC 4 unsuccessful by CRNA secondary to view. 2nd attempt successful by Dr. Lucious, view 2A with Cleotilde 2.

## 2024-07-16 NOTE — Plan of Care (Signed)

## 2024-07-16 NOTE — Transfer of Care (Signed)
 Immediate Anesthesia Transfer of Care Note  Patient: Sarah [name]  Procedure(s) Performed: REPAIR, HERNIA, HIATAL, ROBOT-ASSISTED  Patient Location: PACU  Anesthesia Type:General  Level of Consciousness: drowsy  Airway & Oxygen Therapy: Patient Spontanous Breathing and Patient connected to face mask oxygen  Post-op Assessment: Report given to RN and Post -op Vital signs reviewed and stable  Post vital signs: Reviewed and stable  Last Vitals:  Vitals Value Taken Time  BP 133/79 07/16/24 12:32  Temp    Pulse 64 07/16/24 12:37  Resp 19 07/16/24 12:37  SpO2 94 % 07/16/24 12:37  Vitals shown include unfiled device data.  Last Pain:  Vitals:   07/16/24 0734  TempSrc:   PainSc: 0-No pain         Complications: No notable events documented.

## 2024-07-16 NOTE — Discharge Instructions (Signed)
HIATAL HERNIA REPAIR POST OPERATIVE INSTRUCTIONS  Thinking Clearly  The anesthesia may cause you to feel different for 1 or 2 days. Do not drive, drink alcohol, or make any big decisions for at least 2 days.  Nutrition Take small bites, chew food well, eat slowly. Avoid carbonated beverages, avoid chewing gum, these can cause you to swallow air and cause discomfort as you may not belch as well after hiatal hernia repair. Slowly advance the consistency of your diet to solid food.   Avoid foods that were problematic to swallow before surgery. Try to take in about 70 grams of protein per day - use protein shakes if needed to meet this goal until you're taking a more normal diet. Activity Slowly increase your activity. Be sure to get up and walk every hour or so to prevent blood clots. No heavy lifting or strenuous activity for 4 weeks following surgery to prevent hernias at your incision sites or recurrence of your hernia. It is normal to feel tired. You may need more sleep than usual.  Get your rest but make sure to get up and move around frequently to prevent blood clots and pneumonia.  Work and Return to [employer] can go back to work when you feel well enough. Discuss the timing with your surgeon. If your work requires heavy lifting or strenuous activity you need to be placed on light duty for 4 weeks following surgery. You can return to gym class, sports or other physical activities 4 weeks after surgery.  Wound Care You may experience significant bruising. Do not soak in a bathtub until cleared at your follow up appointment. You may take a shower 24 hours after surgery. If you have dermabond glue covering over the incision, allow the glue to flake off on its own. Protect the new skin, especially from the sun. The sun can burn and cause darker scarring. Your scar will heal in about 4 to 6 weeks and will become softer and continue to fade over the next year.  The cosmetic appearance of  the incisions will improve over the course of the first year after surgery. Sensation around your incision will return in a few weeks or months.  Bowel Movements After intestinal surgery, you may have loose watery stools for several days. If watery diarrhea lasts longer than 3 days, contact your surgeon. Pain medication (narcotics) can cause constipation. Increase the fiber in your diet with high-fiber foods if you are constipated. You can take an over the counter stool softener like Colace to avoid constipation.  Additional over the counter medications can also be used if Colace isn't sufficient (for example, Milk of Magnesia or Miralax).  Pain The amount of pain is different for each person. Some people need only 1 to 3 doses of pain control medication, while others need more. Take alternating doses of tylenol and ibuprofen around the clock for the first five days following surgery.  This will provide a baseline of pain control and help with inflammation.  Take the narcotic pain medication in addition if needed for severe pain.  Contact Your Surgeon at [phone], if you have: Pain that will not go away Pain that gets worse A fever of more than 101F (38.3C) Repeated vomiting Swelling, redness, bleeding, or bad-smelling drainage from your wound site Strong abdominal pain No bowel movement or unable to pass gas for 3 days Watery diarrhea lasting longer than 3 days  Pain Control The goal of pain control is to minimize pain, keep  you moving and help you heal. Your surgical team will work with you on your pain plan. Most often a combination of therapies and medications are used to control your pain. You may also be given medication (local anesthetic) at the surgical site. This may help control your pain for several days. Extreme pain puts extra stress on your body at a time when your body needs to focus on healing. Do not wait until your pain has reached a level "10" or is unbearable before  telling your doctor or nurse. It is much easier to control pain before it becomes severe. Following a laparoscopic procedure, pain is sometimes felt in the shoulder. This is due to the gas inserted into your abdomen during the procedure. Moving and walking helps to decrease the gas and the right shoulder pain.  Use the guide below for ways to manage your post-operative pain. Learn more by going to facs.org/safepaincontrol.  How Intense Is My Pain Common Therapies to Feel Better       I hardly notice my pain, and it does not interfere with my activities.  I notice my pain and it distracts me, but I can still do activities (sitting up, walking, standing).  Non-Medication Therapies  Ice (in a bag, applied over clothing at the surgical site), elevation, rest, meditation, massage, distraction (music, TV, play) walking and mild exercise Splinting the abdomen with pillows +  Non-Opioid Medications Acetaminophen (Tylenol) Non-steroidal anti-inflammatory drugs (NSAIDS) Aspirin, Ibuprofen (Motrin, Advil) Naproxen (Aleve) Take these as needed, when you feel pain. Both acetaminophen and NSAIDs help to decrease pain and swelling (inflammation).      My pain is hard to ignore and is more noticeable even when I rest.  My pain interferes with my usual activities.  Non-Medication Therapies  +  Non-Opioid medications  Take on a regular schedule (around-the-clock) instead of as needed. (For example, Tylenol every 6 hours at 9:00 am, 3:00 pm, 9:00 pm, 3:00 am and Motrin every 6 hours at 12:00 am, 6:00 am, 12:00 pm, 6:00 pm)         I am focused on my pain, and I am not doing my daily activities.  I am groaning in pain, and I cannot sleep. I am unable to do anything.  My pain is as bad as it could be, and nothing else matters.  Non-Medication Therapies  +  Around-the-Clock Non-Opioid Medications  +  Short-acting opioids  Opioids should be used with other medications to  manage severe pain. Opioids block pain and give a feeling of euphoria (feel high). Addiction, a serious side effect of opioids, is rare with short-term (a few days) use.  Examples of short-acting opioids include: Tramadol (Ultram), Hydrocodone (Norco, Vicodin), Hydromorphone (Dilaudid), Oxycodone (Oxycontin)     The above directions have been adapted from the [employer] Surgical Patient Education Program.  Please refer to the ACS website if needed: http://chapman.info/.ashx   Ivar Drape, MD [facility] [address], [address], [place], [place]  [address] ?  [address] [address], [place], [place]   [address] [phone] ? [phone] ? FAX [phone] Web site: [email or web address]

## 2024-07-17 LAB — CBC
HCT: 33.2 % — ABNORMAL LOW (ref 36.0–46.0)
Hemoglobin: 11.2 g/dL — ABNORMAL LOW (ref 12.0–15.0)
MCH: 29.9 pg (ref 26.0–34.0)
MCHC: 33.7 g/dL (ref 30.0–36.0)
MCV: 88.5 fL (ref 80.0–100.0)
Platelets: 174 K/uL (ref 150–400)
RBC: 3.75 MIL/uL — ABNORMAL LOW (ref 3.87–5.11)
RDW: 13 % (ref 11.5–15.5)
WBC: 9.7 K/uL (ref 4.0–10.5)
nRBC: 0 % (ref 0.0–0.2)

## 2024-07-17 LAB — BASIC METABOLIC PANEL WITH GFR
Anion gap: 9 (ref 5–15)
BUN: 14 mg/dL (ref 8–23)
CO2: 23 mmol/L (ref 22–32)
Calcium: 8.9 mg/dL (ref 8.9–10.3)
Chloride: 105 mmol/L (ref 98–111)
Creatinine, Ser: 0.92 mg/dL (ref 0.44–1.00)
GFR, Estimated: >60 mL/min (ref >60)
Glucose, Bld: 109 mg/dL — ABNORMAL HIGH (ref 70–99)
Potassium: 3.7 mmol/L (ref 3.5–5.1)
Sodium: 137 mmol/L (ref 135–145)

## 2024-07-17 NOTE — Progress Notes (Signed)
   07/17/24 1001  TOC Brief Assessment  Insurance and Status Reviewed ([employer] MEDICARE / [facility] MEDICARE)  Patient has primary care physician Yes Lenn, Comer BRAVO, MD)  Home environment has been reviewed Single family home with spouse  Prior level of function: Independent with ADL's  Prior/Current Home Services No current home services  Social Drivers of Health Review SDOH reviewed no interventions necessary  Readmission risk has been reviewed Yes  Transition of care needs no transition of care needs at this time

## 2024-07-17 NOTE — Progress Notes (Signed)
 1 Day Post-Op   Subjective/Chief Complaint: Tol diet, doing great, pain controlled, wants to go home   Objective: Vital signs in last 24 hours: Temp:  [96.5 F (35.8 C)-98 F (36.7 C)] 98 F (36.7 C) (08/02 0544) Pulse Rate:  [45-67] 55 (08/02 0544) Resp:  [14-20] 17 (08/02 0544) BP: (108-169)/(59-90) 119/80 (08/02 0544) SpO2:  [90 %-96 %] 93 % (08/02 0544) Last BM Date : 07/16/24  Intake/Output from previous day: 08/01 0701 - 08/02 0700 In: 2272 [P.O.:660; I.V.:1162; IV Piggyback:450] Out: 1175 [Urine:1150; Blood:25] Intake/Output this shift: No intake/output data recorded.  General nad Pulm effort normal Ab soft approp tender incisions with small amount ecchymosis  Lab Results:  Recent Labs    07/16/24 1613 07/17/24 0521  WBC 9.2 9.7  HGB 12.3 11.2*  HCT 36.9 33.2*  PLT 183 174   BMET Recent Labs    07/16/24 1613 07/17/24 0521  NA  --  137  K  --  3.7  CL  --  105  CO2  --  23  GLUCOSE  --  109*  BUN  --  14  CREATININE 1.09* 0.92  CALCIUM   --  8.9   PT/INR No results for input(s): LABPROT, INR in the last 72 hours. ABG No results for input(s): PHART, HCO3 in the last 72 hours.  Invalid input(s): PCO2, PO2  Studies/Results: No results found.  Anti-infectives: Anti-infectives (From admission, onward)    Start     Dose/Rate Route Frequency Ordered Stop   07/16/24 0715  ceFAZolin  (ANCEF ) IVPB 2g/100 mL premix        2 g 200 mL/hr over 30 Minutes Intravenous On call to O.R. 07/16/24 0705 07/16/24 1011       Assessment/Plan: POD 1 HH repair -home today  Donnice Bury 07/17/2024

## 2024-07-17 NOTE — Plan of Care (Signed)
  Problem: Education: Goal: Knowledge of General Education information will improve Description: Including pain rating scale, medication(s)/side effects and non-pharmacologic comfort measures Outcome: Progressing   Problem: Health Behavior/Discharge Planning: Goal: Ability to manage health-related needs will improve Outcome: Progressing   Problem: Clinical Measurements: Goal: Ability to maintain clinical measurements within normal limits will improve Outcome: Progressing   Problem: Activity: Goal: Risk for activity intolerance will decrease Outcome: Progressing   Problem: Nutrition: Goal: Adequate nutrition will be maintained Outcome: Progressing   Problem: Elimination: Goal: Will not experience complications related to bowel motility Outcome: Progressing   Problem: Pain Managment: Goal: General experience of comfort will improve and/or be controlled Outcome: Progressing   Problem: Safety: Goal: Ability to remain free from injury will improve Outcome: Progressing   Problem: Skin Integrity: Goal: Risk for impaired skin integrity will decrease Outcome: Progressing

## 2024-07-19 ENCOUNTER — Encounter (HOSPITAL_COMMUNITY): Payer: Self-pay | Admitting: Surgery

## 2024-07-19 NOTE — Discharge Summary (Signed)
 Patient ID: Sarah [name] [id number] y.o. Jul 19, 1946  07/16/2024  Discharge date and time: 07/19/2024  Admitting Physician: Deward PARAS [place]  Discharge Physician: Deward PARAS Elizaveta Mattice  Admission Diagnoses: Hiatal hernia [K44.9] Esophageal dysphagia [R13.19] Esophageal dysmotilities [K22.4] Patient Active Problem List   Diagnosis Date Noted   Hiatal hernia 07/16/2024   Pneumonia 02/25/2024   Closed fracture of distal end of left radius 10/15/2023   Rib pain 09/22/2023   Trochanteric bursitis, left hip 11/05/2022   Pain in left knee 03/06/2021   Allergic rhinitis 02/20/2021   Bee allergy status 02/20/2021   Gastro-esophageal reflux disease without esophagitis 02/20/2021   Rash and other nonspecific skin eruption 02/20/2021   Seafood allergy 02/20/2021   Toxic effect of venom of bees, accidental (unintentional), subsequent encounter 02/20/2021   Pain in left leg 10/11/2020   Diverticulitis 07/09/2017   Primary osteoarthritis of both hands 12/23/2016   Primary osteoarthritis of both knees 12/23/2016   Postural kyphosis of thoracic region 12/23/2016   DJD (degenerative joint disease), cervical 12/23/2016   History of gastroesophageal reflux (GERD) 12/23/2016   History of fracture of left hip 12/23/2016   Physical exam 11/04/2016   Environmental and seasonal allergies 06/27/2016   Esophageal dysphagia 06/03/2016   Osteoporosis 08/03/2014   Hyperlipidemia 11/06/2013   Hypertension 11/06/2013     Discharge Diagnoses:  Patient Active Problem List   Diagnosis Date Noted   Hiatal hernia 07/16/2024   Pneumonia 02/25/2024   Closed fracture of distal end of left radius 10/15/2023   Rib pain 09/22/2023   Trochanteric bursitis, left hip 11/05/2022   Pain in left knee 03/06/2021   Allergic rhinitis 02/20/2021   Bee allergy status 02/20/2021   Gastro-esophageal reflux disease without esophagitis 02/20/2021   Rash and other nonspecific skin eruption 02/20/2021   Seafood  allergy 02/20/2021   Toxic effect of venom of bees, accidental (unintentional), subsequent encounter 02/20/2021   Pain in left leg 10/11/2020   Diverticulitis 07/09/2017   Primary osteoarthritis of both hands 12/23/2016   Primary osteoarthritis of both knees 12/23/2016   Postural kyphosis of thoracic region 12/23/2016   DJD (degenerative joint disease), cervical 12/23/2016   History of gastroesophageal reflux (GERD) 12/23/2016   History of fracture of left hip 12/23/2016   Physical exam 11/04/2016   Environmental and seasonal allergies 06/27/2016   Esophageal dysphagia 06/03/2016   Osteoporosis 08/03/2014   Hyperlipidemia 11/06/2013   Hypertension 11/06/2013    Operations: Procedure(s): REPAIR, HERNIA, HIATAL, ROBOT-ASSISTED  Admission Condition: good  Discharged Condition: good  Indication for Admission: Hiatal hernia  Hospital Course: Robotic hiatal hernia repair  Consults: None  Significant Diagnostic Studies: None  Treatments: surgery: As above  Disposition: Home  Patient Instructions:  Allergies as of 07/17/2024       Reactions   Evista  [raloxifene ]    Respiratory infections, shortness of breath(not anaphylaxis), leg heaviness.   Amoxicillin -pot Clavulanate Hives, Other (See Comments)   Doxycycline     Not allergic just doesn't want to take it due to possible side effects   Fosamax [alendronate] Other (See Comments)   Due to Esophagus problems--narrow   Shellfish Allergy Hives   Singulair  [montelukast ] Other (See Comments)   unknown        Medication List     TAKE these medications    aspirin  EC 81 MG tablet Take 81 mg by mouth in the morning.   atorvastatin  10 MG tablet Commonly known as: LIPITOR TAKE 1 TAKE BY MOUTH EVERY 3 DAYS   B-complex with  vitamin C tablet Take 1 tablet by mouth in the morning.   cetirizine  10 MG tablet Commonly known as: ZYRTEC  Take 1 tablet (10 mg total) by mouth daily. What changed:  when to take this reasons  to take this   CITRACAL +D3 PO Take 1 tablet by mouth in the morning.   clobetasol  ointment 0.05 % Commonly known as: TEMOVATE  Apply 1 Application topically 2 (two) times daily as needed (irritation/discomfort).   COQ10 PO Take 1 capsule by mouth 3 days.   docusate sodium  100 MG capsule Commonly known as: COLACE Take 100 mg by mouth at bedtime as needed (constipation.).   EpiPen  2-Pak 0.3 MG/0.3ML Soaj injection Generic drug: EPINEPHrine  Inject 0.3 mg into the muscle as needed. Reported on 06/28/2016   esomeprazole  40 MG capsule Commonly known as: NEXIUM  Take 1 capsule (40 mg total) by mouth 2 (two) times daily before a meal.   fluticasone  50 MCG/ACT nasal spray Commonly known as: FLONASE  Place 2 sprays into both nostrils daily as needed for allergies.   multivitamin with minerals Tabs tablet Take 1 tablet by mouth in the morning.   OCUVITE PO Take 1 tablet by mouth every evening.   ProAir HFA 108 (90 Base) MCG/ACT inhaler Generic drug: albuterol Inhale 1-2 puffs into the lungs every 6 (six) hours as needed for shortness of breath or wheezing.   Probiotic Tabs Take 1 tablet by mouth in the morning.   TRIPLE OMEGA-3-6-9 PO Take 1 capsule by mouth every evening.   TURMERIC PO Take 1,000 mg by mouth in the morning.   valsartan  80 MG tablet Commonly known as: DIOVAN  TAKE 1 TABLET BY MOUTH DAILY   VITAMIN C PO Take 1 tablet by mouth in the morning.   VITAMIN D3 PO Take 1 tablet by mouth in the morning.        Activity: no heavy lifting for 4 weeks Diet: regular diet Wound Care: keep wound clean and dry  Follow-up:  With Dr. Lyndel  Signed: Deward PARAS [facility], [place]   07/19/2024, 5:38 PM

## 2024-08-04 ENCOUNTER — Ambulatory Visit: Admitting: Obstetrics and Gynecology

## 2024-08-25 ENCOUNTER — Ambulatory Visit (INDEPENDENT_AMBULATORY_CARE_PROVIDER_SITE_OTHER)

## 2024-08-25 VITALS — Ht 64.0 in | Wt 143.0 lb

## 2024-08-25 DIAGNOSIS — Z0000 Encounter for general adult medical examination without abnormal findings: Secondary | ICD-10-CM

## 2024-08-25 NOTE — Patient Instructions (Signed)
 Ms. Aungst,  Thank you for taking the time for your Medicare Wellness Visit. I appreciate your continued commitment to your health goals. Please review the care plan we discussed, and feel free to reach out if I can assist you further.  Medicare recommends these wellness visits once per year to help you and your care team stay ahead of potential health issues. These visits are designed to focus on prevention, allowing your provider to concentrate on managing your acute and chronic conditions during your regular appointments.  Please note that Annual Wellness Visits do not include a physical exam. Some assessments may be limited, especially if the visit was conducted virtually. If needed, we may recommend a separate in-person follow-up with your provider.  Ongoing Care Seeing your primary care provider every 3 to 6 months helps us  monitor your health and provide consistent, personalized care. Keep up the good work.  Referrals If a referral was made during today's visit and you haven't received any updates within two weeks, please contact the referred provider directly to check on the status.  Recommended Screenings:  Health Maintenance  Topic Date Due   DTaP/Tdap/Td vaccine (1 - Tdap) Never done   Cologuard (Stool DNA test)  02/02/2024   Medicare Annual Wellness Visit  03/05/2024   Flu Shot  07/16/2024   COVID-19 Vaccine (7 - 2025-26 season) 08/16/2024   Pneumococcal Vaccine for age over 76  Completed   DEXA scan (bone density measurement)  Completed   Hepatitis C Screening  Completed   Zoster (Shingles) Vaccine  Completed   HPV Vaccine  Aged Out   Meningitis B Vaccine  Aged Out       08/25/2024   10:19 AM  Advanced Directives  Does Patient Have a Medical Advance Directive? Yes  Type of Estate agent of [place];Living will  Copy of Healthcare Power of Attorney in Chart? No - copy requested   Advance Care Planning is important because it: Ensures you  receive medical care that aligns with your values, goals, and preferences. Provides guidance to your family and loved ones, reducing the emotional burden of decision-making during critical moments.  Vision: Annual vision screenings are recommended for early detection of glaucoma, cataracts, and diabetic retinopathy. These exams can also reveal signs of chronic conditions such as diabetes and high blood pressure.  Dental: Annual dental screenings help detect early signs of oral cancer, gum disease, and other conditions linked to overall health, including heart disease and diabetes.  Please see the attached documents for additional preventive care recommendations.

## 2024-08-25 NOTE — Progress Notes (Signed)
 Subjective:   Sarah [name] is a 78 y.o. who presents for a Medicare Wellness preventive visit.  As a reminder, Annual Wellness Visits don't include a physical exam, and some assessments may be limited, especially if this visit is performed virtually. We may recommend an in-person follow-up visit with your provider if needed.  Visit Complete: Virtual I connected with  Sarah [name] on 08/25/24 by a audio enabled telemedicine application and verified that I am speaking with the correct person using two identifiers.  Patient Location: Home  Provider Location: Home Office  I discussed the limitations of evaluation and management by telemedicine. The patient expressed understanding and agreed to proceed.  Vital Signs: Because this visit was a virtual/telehealth visit, some criteria may be missing or patient reported. Any vitals not documented were not able to be obtained and vitals that have been documented are patient reported.  VideoDeclined- This patient declined Librarian, academic. Therefore the visit was completed with audio only.  Persons Participating in Visit: Patient.  AWV Questionnaire: No: Patient Medicare AWV questionnaire was not completed prior to this visit.  Cardiac Risk Factors include: advanced age (>36men, >63 women);hypertension;dyslipidemia     Objective:    Today's Vitals   08/25/24 1013  Weight: 143 lb (64.9 kg)  Height: 5' 4 (1.626 m)   Body mass index is 24.55 kg/m.     08/25/2024   10:19 AM 07/16/2024    2:00 PM 07/16/2024    7:29 AM 07/07/2024    8:50 AM 03/06/2023    2:05 PM 11/27/2022    9:03 PM 04/12/2022    9:45 AM  Advanced Directives  Does Patient Have a Medical Advance Directive? Yes Yes Yes No Yes Yes Yes  Type of Estate agent of [place];Living will Healthcare Power of [employer] Power of [employer] Power of [employer] Power of [place];Living will Living  will;Healthcare Power of Attorney  Does patient want to make changes to medical advance directive?  No - Patient declined     No - Patient declined  Copy of Healthcare Power of Attorney in Chart? No - copy requested No - copy requested   Yes - validated most recent copy scanned in chart (See row information)  No - copy requested    Current Medications (verified) Outpatient Encounter Medications as of 08/25/2024  Medication Sig   Ascorbic Acid (VITAMIN C PO) Take 1 tablet by mouth in the morning.   aspirin  EC 81 MG tablet Take 81 mg by mouth in the morning.   atorvastatin  (LIPITOR) 10 MG tablet TAKE 1 TAKE BY MOUTH EVERY 3 DAYS   B Complex-C (B-COMPLEX WITH VITAMIN C) tablet Take 1 tablet by mouth in the morning.   budesonide-formoterol (SYMBICORT) 80-4.5 MCG/ACT inhaler 2 puffs Inhalation Q4H as needed up to 4x a day; Duration: 30 days (Patient taking differently: Inhale 2 puffs into the lungs as needed.)   cetirizine  (ZYRTEC ) 10 MG tablet Take 1 tablet (10 mg total) by mouth daily. (Patient taking differently: Take 10 mg by mouth daily as needed for allergies.)   Cholecalciferol  (VITAMIN D3 PO) Take 1 tablet by mouth in the morning.   clobetasol  ointment (TEMOVATE ) 0.05 % Apply 1 Application topically 2 (two) times daily as needed (irritation/discomfort).   Coenzyme Q10 (COQ10 PO) Take 1 capsule by mouth 3 days.   docusate sodium  (COLACE) 100 MG capsule Take 100 mg by mouth at bedtime as needed (constipation.).   EPIPEN  2-PAK 0.3 MG/0.3ML  SOAJ injection Inject 0.3 mg into the muscle as needed. Reported on 06/28/2016   esomeprazole  (NEXIUM ) 40 MG capsule Take 1 capsule (40 mg total) by mouth 2 (two) times daily before a meal.   fluticasone  (FLONASE ) 50 MCG/ACT nasal spray Place 2 sprays into both nostrils daily as needed for allergies.   Multiple Vitamin (MULTIVITAMIN WITH MINERALS) TABS tablet Take 1 tablet by mouth in the morning.   Multiple Vitamins-Minerals (CITRACAL +D3 PO) Take 1 tablet by  mouth in the morning.   Multiple Vitamins-Minerals (OCUVITE PO) Take 1 tablet by mouth every evening.   Omega 3-6-9 Fatty Acids (TRIPLE OMEGA-3-6-9 PO) Take 1 capsule by mouth every evening.   PROAIR HFA 108 (90 Base) MCG/ACT inhaler Inhale 1-2 puffs into the lungs every 6 (six) hours as needed for shortness of breath or wheezing.   Probiotic TABS Take 1 tablet by mouth in the morning.   TURMERIC PO Take 1,000 mg by mouth in the morning.   valsartan  (DIOVAN ) 80 MG tablet TAKE 1 TABLET BY MOUTH DAILY   No facility-administered encounter medications on file as of 08/25/2024.    Allergies (verified) Evista  [raloxifene ], Amoxicillin -pot clavulanate, Doxycycline , Fosamax [alendronate], Shellfish allergy, and Singulair  [montelukast ]   History: Past Medical History:  Diagnosis Date   Allergy    Arthritis    Asthma    Cancer (HCC)    basal cell cancer--nose   Cataract    left eye and stable    Cervical disc disease    bulging   Diverticulitis    Diverticulosis    GERD (gastroesophageal reflux disease)    Hyperlipidemia    Hypertension    Osteopenia    Osteoporosis    PONV (postoperative nausea and vomiting)    Status post dilation of esophageal narrowing    Past Surgical History:  Procedure Laterality Date   ABDOMINAL HYSTERECTOMY  1995   TVH--still has ovaries   ABDOMINAL SACROCOLPOPEXY  2007   Halban culdoplasty & Burch procedure--Dr. Nikki   APPENDECTOMY     Dr. Gladis   arthroscopic shoulder Left    Dr. Anderson, bone spur   BLADDER SUSPENSION     CERVICAL FUSION     C-4, C-5 fusion   CHOLECYSTECTOMY     Dr. Elsie Sink   ESOPHAGEAL DILATION     HIP ARTHROPLASTY Left 11/07/2013   Procedure: ARTHROPLASTY BIPOLAR HIP;  Surgeon: Maude LELON Anderson, MD;  Location: [facility] OR;  Service: Orthopedics;  Laterality: Left;   KNEE ARTHROSCOPY Bilateral    Dr. Anderson   OTHER SURGICAL HISTORY  2011   posterior Colporrhaphy with Xenform biological graft--Dr. Nikki DOWNY  REPAIR  2011   posterior colporrhaphy with Xenform biological graft--Dr. Nikki   SHOULDER ARTHROSCOPY W/ ROTATOR CUFF REPAIR Left 11/2022   UPPER GASTROINTESTINAL ENDOSCOPY     XI ROBOTIC ASSISTED HIATAL HERNIA REPAIR N/A 07/16/2024   Procedure: REPAIR, HERNIA, HIATAL, ROBOT-ASSISTED;  Surgeon: Lyndel Deward PARAS, MD;  Location: [facility] ORS;  Service: General;  Laterality: N/A;   Family History  Problem Relation Age of Onset   Hypertension Mother    Diabetes Father    Hypertension Father    Heart disease Father    Diabetes Sister    Hypertension Sister    Schizophrenia Sister    Thyroid  disease Sister    Heart disease Sister    Throat cancer Paternal Uncle        smoker   Lung cancer Cousin        smoker  Colon cancer Neg Hx    Pancreatic cancer Neg Hx    Stomach cancer Neg Hx    Liver disease Neg Hx    Colon polyps Neg Hx    Esophageal cancer Neg Hx    Rectal cancer Neg Hx    Social History   Socioeconomic History   Marital status: Married    Spouse name: [name]   Number of children: 1   Years of education: Not on file   Highest education level: Bachelor's degree (e.g., BA, AB, BS)  Occupational History   Occupation: retired  Tobacco Use   Smoking status: Never   Smokeless tobacco: Never  Vaping Use   Vaping status: Never Used  Substance and Sexual Activity   Alcohol use: Yes    Alcohol/week: 2.0 standard drinks of alcohol    Types: 2 Glasses of wine per week    Comment: occas.   Drug use: No   Sexual activity: Yes    Partners: Male    Birth control/protection: Surgical    Comment: TVH--Still has ovaries,declined inuracne questions  Other Topics Concern   Not on file  Social History Narrative   ** Merged History Encounter **    Lives with husband/2025   Social Drivers of Health   Financial Resource Strain: Low Risk  (08/25/2024)   Overall Financial Resource Strain (CARDIA)    Difficulty of Paying Living Expenses: Not hard at all  Food Insecurity: No Food  Insecurity (08/25/2024)   Hunger Vital Sign    Worried About Running Out of Food in the Last Year: Never true    Ran Out of Food in the Last Year: Never true  Transportation Needs: No Transportation Needs (08/25/2024)   PRAPARE - Administrator, Civil Service (Medical): No    Lack of Transportation (Non-Medical): No  Physical Activity: Insufficiently Active (08/25/2024)   Exercise Vital Sign    Days of Exercise per Week: 4 days    Minutes of Exercise per Session: 20 min  Stress: No Stress Concern Present (08/25/2024)   [employer] of Occupational Health - Occupational Stress Questionnaire    Feeling of Stress: Not at all  Social Connections: Socially Integrated (08/25/2024)   Social Connection and Isolation Panel    Frequency of Communication with Friends and Family: Once a week    Frequency of Social Gatherings with Friends and Family: More than three times a week    Attends Religious Services: More than 4 times per year    Active Member of [employer] or Organizations: Yes    Attends Engineer, structural: More than 4 times per year    Marital Status: Married    Tobacco Counseling Counseling given: Not Answered    Clinical Intake:  Pre-visit preparation completed: Yes  Pain : No/denies pain     BMI - recorded: 24.55 Nutritional Status: BMI of 19-24  Normal Nutritional Risks: None Diabetes: No  No results found for: HGBA1C   How often do you need to have someone help you when you read instructions, pamphlets, or other written materials from your doctor or pharmacy?: 1 - Never  Interpreter Needed?: No  Information entered by :: Isabel Ardila, RMA   Activities of Daily Living     08/25/2024   10:16 AM 07/16/2024    2:00 PM  In your present state of health, do you have any difficulty performing the following activities:  Hearing? 0 0  Vision? 0 0  Difficulty concentrating or making decisions? 0 0  Walking or climbing stairs? 0   Dressing or  bathing? 0   Doing errands, shopping? 0 0  Preparing Food and eating ? N   Using the Toilet? N   In the past six months, have you accidently leaked urine? N   Do you have problems with loss of bowel control? N   Managing your Medications? N   Managing your Finances? N   Housekeeping or managing your Housekeeping? N     Patient Care Team: Mahlon Comer BRAVO, MD as PCP - General (Family Medicine) Teressa Toribio SQUIBB, MD (Inactive) as Attending Physician (Gastroenterology) Fleeta Smock, Lamar BROCKS, MD as Consulting Physician (Allergy and Immunology) Cathlyn BROCKS Nikki Bobie BRAVO, MD as Consulting Physician (Obstetrics and Gynecology) Lanis Pupa, MD as Consulting Physician (Neurosurgery) Elspeth Lauraine DEL, OD as Consulting Physician Delores Doughty, DC (Chiropractic Medicine) [facility]  I have updated your Care Teams any recent Medical Services you may have received from other providers in the past year.     Assessment:   This is a routine wellness examination for Sarah [name].  Hearing/Vision screen Hearing Screening - Comments:: Denies hearing difficulties   Vision Screening - Comments:: Wears eyeglasses/ [facility]   Goals Addressed             This Visit's Progress    Patient Stated   On track    Maintain current health & drink more water       Depression Screen     08/25/2024   10:23 AM 02/02/2024    2:51 PM 04/24/2023    2:08 PM 03/06/2023    2:11 PM 01/09/2023    9:30 AM 08/09/2022    9:03 AM 05/08/2022    2:24 PM  PHQ 2/9 Scores  PHQ - 2 Score 0 0 0 0 0 0 0  PHQ- 9 Score 0 0 0 1 0 0 0    Fall Risk     08/25/2024   10:20 AM 02/18/2024    9:12 AM 02/02/2024    2:51 PM 04/24/2023    2:08 PM 03/06/2023    2:04 PM  Fall Risk   Falls in the past year? 0 0 0 1 1  Number falls in past yr: 0 0 0 0 0  Injury with Fall? 0 0 0 1 1  Risk for fall due to :  No Fall Risks  History of fall(s);Orthopedic patient   Follow up Falls evaluation completed;Falls prevention  discussed    Falls evaluation completed;Education provided;Falls prevention discussed    MEDICARE RISK AT HOME:  Medicare Risk at Home Any stairs in or around the home?: Yes (4 steps up to door) If so, are there any without handrails?: No Home free of loose throw rugs in walkways, pet beds, electrical cords, etc?: Yes Adequate lighting in your home to reduce risk of falls?: Yes Life alert?: No Use of a cane, walker or w/c?: No Grab bars in the bathroom?: Yes Shower chair or bench in shower?: No Elevated toilet seat or a handicapped toilet?: No  TIMED UP AND GO:  Was the test performed?  No  Cognitive Function: Declined/Normal: No cognitive concerns noted by patient or family. Patient alert, oriented, able to answer questions appropriately and recall recent events. No signs of memory loss or confusion.    11/19/2018    9:15 AM 11/12/2017    9:37 AM  MMSE - Mini Mental State Exam  Orientation to time 5 5   Orientation to Place 5 5  Registration 3 3   Attention/ Calculation 5 5   Recall 3 3   Language- name 2 objects 2 2   Language- repeat 1 1  Language- follow 3 step command 3 3   Language- read & follow direction 1 1   Write a sentence 1 1   Copy design 1 1   Total score 30 30      Data saved with a previous flowsheet row definition        03/06/2023    2:08 PM 11/27/2020    8:22 AM  6CIT Screen  What Year? 0 points 0 points  What month? 3 points 0 points  What time? 0 points 0 points  Count back from 20 0 points 0 points  Months in reverse 0 points 0 points  Repeat phrase 0 points 0 points  Total Score 3 points 0 points    Immunizations Immunization History  Administered Date(s) Administered   Fluad Quad(high Dose 65+) 09/28/2021   Fluad Trivalent(High Dose 65+) 08/14/2023   INFLUENZA, HIGH DOSE SEASONAL PF 09/21/2014, 09/02/2016, 03/25/2017, 07/15/2018, 01/20/2020, 09/12/2020   Influenza,inj,Quad PF,6+ Mos 09/05/2015, 09/15/2018, 10/02/2018, 08/10/2019,  08/09/2022   Influenza-Unspecified 08/15/2017   PFIZER Comirnaty(Gray Top)Covid-19 Tri-Sucrose Vaccine 04/20/2021   PFIZER(Purple Top)SARS-COV-2 Vaccination 12/27/2019, 01/17/2020, 09/12/2020, 03/23/2021, 03/19/2022   Pneumococcal Conjugate-13 05/22/2015   Pneumococcal Polysaccharide-23 11/04/2016, 03/25/2017, 07/15/2018, 09/21/2018, 01/20/2020, 03/23/2021   Zoster Recombinant(Shingrix) 03/24/2017, 07/15/2017    Screening Tests Health Maintenance  Topic Date Due   DTaP/Tdap/Td (1 - Tdap) Never done   Fecal DNA (Cologuard)  02/02/2024   Medicare Annual Wellness (AWV)  03/05/2024   Influenza Vaccine  07/16/2024   COVID-19 Vaccine (7 - 2025-26 season) 08/16/2024   Pneumococcal Vaccine: 50+ Years  Completed   DEXA SCAN  Completed   Hepatitis C Screening  Completed   Zoster Vaccines- Shingrix  Completed   HPV VACCINES  Aged Out   Meningococcal B Vaccine  Aged Out    Health Maintenance Items Addressed: See Nurse Notes at the end of this note  Additional Screening:  Vision Screening: Recommended annual ophthalmology exams for early detection of glaucoma and other disorders of the eye. Is the patient up to date with their annual eye exam?  No  Who is the provider or what is the name of the office in which the patient attends annual eye exams? [facility]  Dental Screening: Recommended annual dental exams for proper oral hygiene  Community Resource Referral / Chronic Care Management: CRR required this visit?  No   CCM required this visit?  No   Plan:    I have personally reviewed and noted the following in the patient's chart:   Medical and social history Use of alcohol, tobacco or illicit drugs  Current medications and supplements including opioid prescriptions. Patient is not currently taking opioid prescriptions. Functional ability and status Nutritional status Physical activity Advanced directives List of other physicians Hospitalizations, surgeries, and ER  visits in previous 12 months Vitals Screenings to include cognitive, depression, and falls Referrals and appointments  In addition, I have reviewed and discussed with patient certain preventive protocols, quality metrics, and best practice recommendations. A written personalized care plan for preventive services as well as general preventive health recommendations were provided to patient.   Kaye Luoma L Lydian Chavous, CMA   08/25/2024   After Visit Summary: (MyChart) Due to this being a telephonic visit, the after visit summary with patients personalized plan was offered to patient via MyChart   Notes: Patient  stated that she had a colonoscopy earlier this year and will be getting her Flu vaccine at her allergist office in 2 weeks.  Patient declines a DEXA.  She had no concerns to address today.

## 2024-09-19 ENCOUNTER — Other Ambulatory Visit: Payer: Self-pay | Admitting: Family Medicine

## 2024-09-19 DIAGNOSIS — I10 Essential (primary) hypertension: Secondary | ICD-10-CM

## 2024-10-08 ENCOUNTER — Encounter: Payer: Self-pay | Admitting: Family Medicine

## 2024-10-08 ENCOUNTER — Ambulatory Visit (INDEPENDENT_AMBULATORY_CARE_PROVIDER_SITE_OTHER): Admitting: Family Medicine

## 2024-10-08 VITALS — BP 120/72 | HR 76 | Temp 97.7°F | Resp 15 | Ht 64.0 in | Wt 139.6 lb

## 2024-10-08 DIAGNOSIS — R300 Dysuria: Secondary | ICD-10-CM | POA: Diagnosis not present

## 2024-10-08 LAB — POCT URINALYSIS DIPSTICK
Bilirubin, UA: NEGATIVE
Glucose, UA: NEGATIVE
Ketones, UA: NEGATIVE
Nitrite, UA: NEGATIVE
Protein, UA: NEGATIVE
Spec Grav, UA: 1.01 (ref 1.010–1.025)
Urobilinogen, UA: 0.2 U/dL
pH, UA: 6 (ref 5.0–8.0)

## 2024-10-08 MED ORDER — CEPHALEXIN 500 MG PO CAPS
500.0000 mg | ORAL_CAPSULE | Freq: Two times a day (BID) | ORAL | 0 refills | Status: DC
Start: 2024-10-08 — End: 2024-10-11

## 2024-10-08 MED ORDER — NITROFURANTOIN MONOHYD MACRO 100 MG PO CAPS: 100.0000 mg | ORAL_CAPSULE | Freq: Two times a day (BID) | ORAL | 0 refills | Status: AC

## 2024-10-08 NOTE — Patient Instructions (Signed)
 Follow up as needed or as scheduled START the Cephalexin twice daily Drink LOTS of fluids Call with any questions or concerns Stay Safe!  Stay Healthy! Hang in there!

## 2024-10-08 NOTE — Progress Notes (Signed)
   Subjective:    Patient ID: Sarah [name], female    DOB: [birth year], 78 y.o.   MRN: [id number]  HPI Dysuria- sxs started ~1 week ago.  + pain w/ urination.  Unsure if change in frequency.  No visible blood.  No fevers.  Denies suprapubic pain/pressure.   Review of Systems For ROS see HPI     Objective:   Physical Exam Vitals reviewed.  Constitutional:      General: She is not in acute distress.    Appearance: She is well-developed.  Abdominal:     General: There is no distension.     Palpations: Abdomen is soft.     Tenderness: There is no abdominal tenderness (no suprapubic or CVA tenderness). There is no guarding or rebound.  Skin:    General: Skin is warm and dry.  Neurological:     General: No focal deficit present.     Mental Status: She is oriented to person, place, and time.  Psychiatric:        Mood and Affect: Mood normal.        Behavior: Behavior normal.        Thought Content: Thought content normal.           Assessment & Plan:  Dysuria- new.  Pt's UA suspicious for infection.  Start Keflex BID.  Reviewed supportive care and red flags that should prompt return.  Pt expressed understanding and is in agreement w/ plan.

## 2024-10-11 ENCOUNTER — Ambulatory Visit: Payer: Self-pay | Admitting: Family Medicine

## 2024-10-11 ENCOUNTER — Encounter: Payer: Self-pay | Admitting: Family Medicine

## 2024-10-11 ENCOUNTER — Ambulatory Visit (INDEPENDENT_AMBULATORY_CARE_PROVIDER_SITE_OTHER): Admitting: Family Medicine

## 2024-10-11 ENCOUNTER — Ambulatory Visit: Payer: Self-pay

## 2024-10-11 VITALS — BP 116/78 | HR 65 | Temp 97.9°F | Resp 14 | Ht 64.0 in | Wt 140.6 lb

## 2024-10-11 DIAGNOSIS — T63441A Toxic effect of venom of bees, accidental (unintentional), initial encounter: Secondary | ICD-10-CM | POA: Insufficient documentation

## 2024-10-11 DIAGNOSIS — M25571 Pain in right ankle and joints of right foot: Secondary | ICD-10-CM | POA: Diagnosis not present

## 2024-10-11 DIAGNOSIS — M79671 Pain in right foot: Secondary | ICD-10-CM | POA: Diagnosis not present

## 2024-10-11 DIAGNOSIS — R062 Wheezing: Secondary | ICD-10-CM | POA: Insufficient documentation

## 2024-10-11 DIAGNOSIS — T7819XA Other adverse food reactions, not elsewhere classified, initial encounter: Secondary | ICD-10-CM | POA: Insufficient documentation

## 2024-10-11 DIAGNOSIS — A498 Other bacterial infections of unspecified site: Secondary | ICD-10-CM

## 2024-10-11 LAB — URINE CULTURE
MICRO NUMBER:: 17144734
SPECIMEN QUALITY:: ADEQUATE

## 2024-10-11 NOTE — Progress Notes (Signed)
 Pt aware.

## 2024-10-11 NOTE — Progress Notes (Signed)
 Subjective:  Patient ID: Sarah [name], female    DOB: 04/03/1946  Age: 78 y.o. MRN: [id number]  CC:  Chief Complaint  Patient presents with   Foot Pain    Right heel and foot pain.sx started yesterday. Getting worse. Thinks its from an abx she is taking. Started abx Saturday afternoon.     HPI Kyla Duffy Mans presents for   Acute visit for above, PCP is Dr. Mahlon. Treated by PCP on 10/08/2024 with cephalexin for dysuria.  Follow-up MyChart message noted with concerns regarding that medication.  She was changed to Macrobid 3 days ago.   Culture positive for Enterobacter greater than 100,000.  Sensitive to Macrobid.   Today presents with symptoms of right foot and heel pain. Started this am - pain under R heel and behind the achilles.  Nki.  No falls. Walking program past 3 weeks., no recent changes.  No swelling/redness/skin changes.  Similar symptoms with a different med in the past. Unsure which med, but similar pain.  Tx: none.  Able to weight bear.      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 10/11/2024   Wheezing 10/11/2024   Hiatal hernia 07/16/2024   Pneumonia 02/25/2024   Closed fracture of distal end of left radius 10/15/2023   Rib pain 09/22/2023   Trochanteric bursitis, left hip 11/05/2022   Pain in left knee 03/06/2021   Allergic rhinitis 02/20/2021   Bee allergy status 02/20/2021   Gastro-esophageal reflux disease without esophagitis 02/20/2021   Rash and other nonspecific skin eruption 02/20/2021   Seafood allergy 02/20/2021   Toxic effect of venom of bees, accidental (unintentional), subsequent encounter 02/20/2021   Pain in left leg 10/11/2020   Diverticulitis 07/09/2017   Primary osteoarthritis of both hands 12/23/2016   Primary osteoarthritis of both knees 12/23/2016   Postural kyphosis of thoracic region 12/23/2016   DJD (degenerative joint disease),  cervical 12/23/2016   History of gastroesophageal reflux (GERD) 12/23/2016   History of fracture of left hip 12/23/2016   Physical exam 11/04/2016   Environmental and seasonal allergies 06/27/2016   Esophageal dysphagia 06/03/2016   Osteoporosis 08/03/2014   Hyperlipidemia 11/06/2013   Hypertension 11/06/2013   Past Medical History:  Diagnosis Date   Allergy    Arthritis    Asthma    Cancer (HCC)    basal cell cancer--nose   Cataract    left eye and stable    Cervical disc disease    bulging   Diverticulitis    Diverticulosis    GERD (gastroesophageal reflux disease)    Hyperlipidemia    Hypertension    Osteopenia    Osteoporosis    PONV (postoperative nausea and vomiting)    Status post dilation of esophageal narrowing    Past Surgical History:  Procedure Laterality Date   ABDOMINAL HYSTERECTOMY  1995   TVH--still has ovaries   ABDOMINAL SACROCOLPOPEXY  2007   Halban culdoplasty & Burch procedure--Dr. Nikki   APPENDECTOMY     Dr. Gladis   arthroscopic shoulder Left    Dr. Anderson, bone spur   BLADDER SUSPENSION     CERVICAL FUSION     C-4, C-5 fusion   CHOLECYSTECTOMY     Dr. Elsie Sink   ESOPHAGEAL DILATION     FRACTURE SURGERY  see joint replacement above   HIP ARTHROPLASTY Left 11/07/2013   Procedure: ARTHROPLASTY BIPOLAR HIP;  Surgeon: Maude LELON Right, MD;  Location: [facility] OR;  Service: Orthopedics;  Laterality: Left;   JOINT REPLACEMENT  December 2015   Partial hip (ball) after fall   KNEE ARTHROSCOPY Bilateral    Dr. Right   OTHER SURGICAL HISTORY  2011   posterior Colporrhaphy with Xenform biological graft--Dr. Nikki DOWNY REPAIR  2011   posterior colporrhaphy with Xenform biological graft--Dr. Nikki   SHOULDER ARTHROSCOPY W/ ROTATOR CUFF REPAIR Left 11/2022   SPINE SURGERY  2016   two vertebrae fused   UPPER GASTROINTESTINAL ENDOSCOPY     XI ROBOTIC ASSISTED HIATAL HERNIA REPAIR N/A 07/16/2024   Procedure: REPAIR, HERNIA, HIATAL,  ROBOT-ASSISTED;  Surgeon: Lyndel Deward PARAS, MD;  Location: [facility] ORS;  Service: General;  Laterality: N/A;   Allergies  Allergen Reactions   Evista  [Raloxifene ]     Respiratory infections, shortness of breath(not anaphylaxis), leg heaviness.   Amoxicillin -Pot Clavulanate Hives and Other (See Comments)   Doxycycline      Not allergic just doesn't want to take it due to possible side effects   Fosamax [Alendronate] Other (See Comments)    Due to Esophagus problems--narrow   Shellfish Allergy Hives   Singulair  [Montelukast ] Other (See Comments)    unknown   Prior to Admission medications   Medication Sig Start Date End Date Taking? Authorizing Provider  Ascorbic Acid (VITAMIN C PO) Take 1 tablet by mouth in the morning.   Yes [provider]  aspirin  EC 81 MG tablet Take 81 mg by mouth in the morning.   Yes [provider]  atorvastatin  (LIPITOR) 10 MG tablet TAKE 1 TAKE BY MOUTH EVERY 3 DAYS 08/11/23  Yes Tabori, Katherine E, MD  B Complex-C (B-COMPLEX WITH VITAMIN C) tablet Take 1 tablet by mouth in the morning.   Yes [provider]  budesonide-formoterol (SYMBICORT) 80-4.5 MCG/ACT inhaler 2 puffs Inhalation Q4H as needed up to 4x a day; Duration: 30 days Patient taking differently: Inhale 2 puffs into the lungs as needed.   Yes [provider]  cetirizine  (ZYRTEC ) 10 MG tablet Take 1 tablet (10 mg total) by mouth daily. Patient taking differently: Take 10 mg by mouth daily as needed for allergies. 11/04/16  Yes Tabori, Katherine E, MD  Cholecalciferol  (VITAMIN D3 PO) Take 1 tablet by mouth in the morning.   Yes [provider]  clobetasol  ointment (TEMOVATE ) 0.05 % Apply 1 Application topically 2 (two) times daily as needed (irritation/discomfort).   Yes [provider]  Coenzyme Q10 (COQ10 PO) Take 1 capsule by mouth 3 days.   Yes [provider]  docusate sodium  (COLACE) 100 MG capsule Take 100 mg by mouth at bedtime as  needed (constipation.).   Yes [provider]  EPIPEN  2-PAK 0.3 MG/0.3ML SOAJ injection Inject 0.3 mg into the muscle as needed. Reported on 06/28/2016 06/29/13  Yes [provider]  esomeprazole  (NEXIUM ) 40 MG capsule Take 1 capsule (40 mg total) by mouth 2 (two) times daily before a meal. 05/03/24  Yes [place], PA-C  fluticasone  (FLONASE ) 50 MCG/ACT nasal spray Place 2 sprays into both nostrils daily as needed for allergies.   Yes [provider]  Multiple Vitamins-Minerals (CITRACAL +D3 PO) Take 1 tablet by mouth in the morning.   Yes [provider]  nitrofurantoin, macrocrystal-monohydrate, (MACROBID) 100 MG capsule Take 1 capsule (100 mg total) by mouth 2 (two) times daily. 10/08/24  Yes Tabori, Katherine E, MD  Omega 3-6-9 Fatty Acids (TRIPLE OMEGA-3-6-9 PO) Take 1  capsule by mouth every evening.   Yes [provider]  PROAIR HFA 108 (90 Base) MCG/ACT inhaler Inhale 1-2 puffs into the lungs every 6 (six) hours as needed for shortness of breath or wheezing. 04/18/17  Yes [provider]  Probiotic TABS Take 1 tablet by mouth in the morning.   Yes [provider]  TURMERIC PO Take 1,000 mg by mouth in the morning.   Yes [provider]  valsartan  (DIOVAN ) 80 MG tablet TAKE 1 TABLET BY MOUTH DAILY 06/24/24  Yes Tabori, Katherine E, MD  cephALEXin (KEFLEX) 500 MG capsule Take 1 capsule (500 mg total) by mouth 2 (two) times daily. Patient not taking: Reported on 10/11/2024 10/08/24   Tabori, Katherine E, MD  Multiple Vitamin (MULTIVITAMIN WITH MINERALS) TABS tablet Take 1 tablet by mouth in the morning. Patient not taking: Reported on 10/11/2024    [provider]  Multiple Vitamins-Minerals (OCUVITE PO) Take 1 tablet by mouth every evening. Patient not taking: Reported on 10/11/2024    [provider]   Social History   Socioeconomic History   Marital status: Married    Spouse name: [name]   Number of  children: 1   Years of education: Not on file   Highest education level: Bachelor's degree (e.g., BA, AB, BS)  Occupational History   Occupation: retired  Tobacco Use   Smoking status: Never   Smokeless tobacco: Never  Vaping Use   Vaping status: Never Used  Substance and Sexual Activity   Alcohol use: Yes    Alcohol/week: 2.0 standard drinks of alcohol    Types: 2 Glasses of wine per week    Comment: occas.   Drug use: No   Sexual activity: Yes    Partners: Male    Birth control/protection: Surgical    Comment: TVH--Still has ovaries,declined inuracne questions  Other Topics Concern   Not on file  Social History Narrative   ** Merged History Encounter **    Lives with husband/2025   Social Drivers of Health   Financial Resource Strain: Low Risk  (10/06/2024)   Overall Financial Resource Strain (CARDIA)    Difficulty of Paying Living Expenses: Not hard at all  Food Insecurity: No Food Insecurity (10/06/2024)   Hunger Vital Sign    Worried About Running Out of Food in the Last Year: Never true    Ran Out of Food in the Last Year: Never true  Transportation Needs: No Transportation Needs (10/06/2024)   PRAPARE - Administrator, Civil Service (Medical): No    Lack of Transportation (Non-Medical): No  Physical Activity: Sufficiently Active (10/06/2024)   Exercise Vital Sign    Days of Exercise per Week: 7 days    Minutes of Exercise per Session: 30 min  Recent Concern: Physical Activity - Insufficiently Active (08/25/2024)   Exercise Vital Sign    Days of Exercise per Week: 4 days    Minutes of Exercise per Session: 20 min  Stress: No Stress Concern Present (10/06/2024)   [employer] of Occupational Health - Occupational Stress Questionnaire    Feeling of Stress: Only a little  Social Connections: Socially Integrated (10/06/2024)   Social Connection and Isolation Panel    Frequency of Communication with Friends and Family: Three times a week     Frequency of Social Gatherings with Friends and Family: Three times a week    Attends Religious Services: More than 4 times per year    Active Member of Clubs or  Organizations: Yes    Attends Banker Meetings: 1 to 4 times per year    Marital Status: Married  Catering Manager Violence: Not At Risk (08/25/2024)   Humiliation, Afraid, Rape, and Kick questionnaire    Fear of Current or Ex-Partner: No    Emotionally Abused: No    Physically Abused: No    Sexually Abused: No    Review of Systems   Objective:   Vitals:   10/11/24 1127  BP: 116/78  Pulse: 65  Resp: 14  Temp: 97.9 F (36.6 C)  TempSrc: Temporal  SpO2: 98%  Weight: 140 lb 9.6 oz (63.8 kg)  Height: 5' 4 (1.626 m)     Physical Exam Constitutional:      General: She is not in acute distress.    Appearance: Normal appearance. She is well-developed.  HENT:     Head: Normocephalic and atraumatic.  Cardiovascular:     Rate and Rhythm: Normal rate.  Pulmonary:     Effort: Pulmonary effort is normal.  Musculoskeletal:     Comments: Right lower extremity Calf, no edema, swelling or focal calf tenderness.  Negative Homans.  Negative Thompson's.  Pain-free resisted plantarflexion and dorsiflexion of ankle.  No weakness.  Slight discomfort at the distal Achilles, retrocalcaneal bursa area without defect, swelling, erythema.  Slight discomfort at the lateral calcaneus with negative lateral squeeze, plantar calcaneus and plantar fascia nontender.  Malleoli, fifth metatarsal, navicular nontender and no soft tissue swelling, erythema of foot or tenderness of foot other than above.  Neurovascular intact distally.  Neurological:     Mental Status: She is alert and oriented to person, place, and time.  Psychiatric:        Mood and Affect: Mood normal.     I personally spent a total of 36 minutes in the care of the patient today including preparing to see the patient, getting/reviewing separately obtained history,  performing a medically appropriate exam/evaluation, documenting clinical information in the EHR, independently interpreting results, and communicating results.   Assessment & Plan:  NELLIE PESTER is a 78 y.o. female . Infection caused by Enterobacter cloacae  Acute right ankle pain  Right foot pain Enterobacter UTI with sensitivities reviewed.  Per up-to-date arthralgias have been listed as a possible side effect of Macrobid.  Reviewing her culture results and alternatives, Septra  given as option, ultimately she decided to remain on current dose of Macrobid for at least 5-day course and treatment of arthralgias with Tylenol , symptomatic care, activity modification with RTC precautions.  No orders of the defined types were placed in this encounter.  Patient Instructions  Thank you for coming in today.  I am sorry to hear about your foot and ankle pain.  Although it does appear to be rare, I do see arthralgias or joint pains listed as a possible side effect with the nitrofurantoin.  As we discussed an alternate option would be a sulfa  medication and it appears you took that in 2022, but I think it is also reasonable to try to stay on current dose of nitrofurantoin for 2 additional days for a total of 5 days of treatment.  Tylenol , warm compresses, or other symptomatic care for the soreness in the foot/ankle would be reasonable for now.  Avoidance of walking other than usual activities at home for now would be reasonable.  If symptoms are not improving after completion of antibiotic or worsening sooner, please be seen.  Hang in there!    Signed,   Reyes  Levora, MD [facility], [facility] 10/11/24 12:08 PM

## 2024-10-11 NOTE — Telephone Encounter (Signed)
 FYI Only or Action Required?: FYI only for provider.  Patient was last seen in primary care on 10/08/2024 by Mahlon Comer BRAVO, MD.  Called Nurse Triage reporting foot pain.  Symptoms began today.  Interventions attempted: Rest, hydration, or home remedies.  Symptoms are: gradually worsening.  Triage Disposition: See PCP When Office is Open (Within 3 Days)  Patient/caregiver understands and will follow disposition?: Yes  Copied from CRM [id number]. Topic: Clinical - Red Word Triage >> Oct 11, 2024  9:38 AM Robinson H wrote: [facility] that prompted transfer to Nurse Triage: Woke up this morning with bad pain in my right Achilles tendon. Hurts underneath my heel and up the back of my foot Reason for Disposition  [1] MODERATE pain (e.g., interferes with normal activities, limping) AND [2] present > 3 days  Answer Assessment - Initial Assessment Questions 1. ONSET: When did the pain start?      This morning 2. LOCATION: Where is the pain located?      From underneath heel up around achilles tendon 3. PAIN: How bad is the pain?    (Scale 1-10; or mild, moderate, severe)     Sitting still a 4, when trying to walk the patient increases 4. WORK OR EXERCISE: Has there been any recent work or exercise that involved this part of the body?      No 5. CAUSE: What do you think is causing the foot pain?     Patient is uncertain - she states she thinks it is due to her antibiotics Cephalexin and Macrobid 6. OTHER SYMPTOMS: Do you have any other symptoms? (e.g., leg pain, rash, fever, numbness)       No numbness, tingling, or swelling  Protocols used: Foot Pain-A-AH

## 2024-10-11 NOTE — Patient Instructions (Addendum)
 Thank you for coming in today.  I am sorry to hear about your foot and ankle pain.  Although it does appear to be rare, I do see arthralgias or joint pains listed as a possible side effect with the nitrofurantoin.  As we discussed an alternate option would be a sulfa  medication and it appears you took that in 2022, but I think it is also reasonable to try to stay on current dose of nitrofurantoin for 2 additional days for a total of 5 days of treatment.  Tylenol , warm compresses, or other symptomatic care for the soreness in the foot/ankle would be reasonable for now.  Avoidance of walking other than usual activities at home for now would be reasonable.  If symptoms are not improving after completion of antibiotic or worsening sooner, please be seen.  Hang in there!

## 2024-10-19 NOTE — Progress Notes (Unsigned)
 78 y.o. G5P1011 Married Caucasian female here for a breast and pelvic exam.    The patient is also followed for lichen sclerosus, pelvic organ prolapse, and osteoporosis.   Having a flare of lichen sclerosus.    Recent UTI.  PCP treated.     No bladder function concerns.  Husband has health issues.   [place].    PCP: Mahlon Comer BRAVO, MD   Patient's last menstrual period was 12/16/1993 (approximate).           Sexually active: No.   The current method of family planning is status post hysterectomy.    Menopausal hormone therapy:  n/a Exercising: Yes.    Walking  Smoker:  no  OB History     Gravida  2   Para  1   Term  1   Preterm  0   AB  1   Living  1      SAB  0   IAB  0   Ectopic  0   Multiple      Live Births              HEALTH MAINTENANCE: Last 2 paps: 06/03/11 neg History of abnormal Pap or positive HPV:  no Mammogram:  03/15/24 Breast Density Cat C, BIRADS Cat 1 neg  Colonoscopy:  11/16/21 -  Bone Density:  09/11/15  Result  osteoporosis    Immunization History  Administered Date(s) Administered   Fluad Quad(high Dose 65+) 09/28/2021   Fluad Trivalent(High Dose 65+) 08/14/2023   INFLUENZA, HIGH DOSE SEASONAL PF 09/21/2014, 09/02/2016, 03/25/2017, 07/15/2018, 01/20/2020, 09/12/2020   Influenza,inj,Quad PF,6+ Mos 09/05/2015, 09/15/2018, 10/02/2018, 08/10/2019, 08/09/2022, 10/04/2022   Influenza-Unspecified 08/15/2017   PFIZER Comirnaty(Gray Top)Covid-19 Tri-Sucrose Vaccine 04/20/2021   PFIZER(Purple Top)SARS-COV-2 Vaccination 12/27/2019, 01/17/2020, 09/12/2020, 03/23/2021, 03/19/2022   Pneumococcal Conjugate-13 05/22/2015   Pneumococcal Polysaccharide-23 11/04/2016, 03/25/2017, 07/15/2018, 09/21/2018, 01/20/2020, 03/23/2021, 10/04/2022   Zoster Recombinant(Shingrix) 03/24/2017, 07/15/2017      reports that she has never smoked. She has never used smokeless tobacco. She reports current alcohol use of about 2.0  standard drinks of alcohol per week. She reports that she does not use drugs.  Past Medical History:  Diagnosis Date   Allergy    Arthritis    Asthma    Cancer (HCC)    basal cell cancer--nose   Cataract    left eye and stable    Cervical disc disease    bulging   Diverticulitis    Diverticulosis    GERD (gastroesophageal reflux disease)    Hyperlipidemia    Hypertension    Osteopenia    Osteoporosis    PONV (postoperative nausea and vomiting)    Status post dilation of esophageal narrowing     Past Surgical History:  Procedure Laterality Date   ABDOMINAL HYSTERECTOMY  1995   TVH--still has ovaries   ABDOMINAL SACROCOLPOPEXY  2007   Halban culdoplasty & Burch procedure--Dr. Nikki   APPENDECTOMY     Dr. Gladis   arthroscopic shoulder Left    Dr. Anderson, bone spur   BLADDER SUSPENSION     CERVICAL FUSION     C-4, C-5 fusion   CHOLECYSTECTOMY     Dr. Elsie Sink   ESOPHAGEAL DILATION     FRACTURE SURGERY  see joint replacement above   HIP ARTHROPLASTY Left 11/07/2013   Procedure: ARTHROPLASTY BIPOLAR HIP;  Surgeon: Maude LELON Anderson, MD;  Location: [facility] OR;  Service: Orthopedics;  Laterality: Left;   JOINT REPLACEMENT  December 2015   Partial hip (ball) after fall   KNEE ARTHROSCOPY Bilateral    Dr. Anderson   OTHER SURGICAL HISTORY  2011   posterior Colporrhaphy with Xenform biological graft--Dr. Nikki DOWNY REPAIR  2011   posterior colporrhaphy with Xenform biological graft--Dr. Nikki   SHOULDER ARTHROSCOPY W/ ROTATOR CUFF REPAIR Left 11/2022   SPINE SURGERY  2016   two vertebrae fused   UPPER GASTROINTESTINAL ENDOSCOPY     XI ROBOTIC ASSISTED HIATAL HERNIA REPAIR N/A 07/16/2024   Procedure: REPAIR, HERNIA, HIATAL, ROBOT-ASSISTED;  Surgeon: Lyndel Deward PARAS, MD;  Location: [facility] ORS;  Service: General;  Laterality: N/A;    Current Outpatient Medications  Medication Sig Dispense Refill   Ascorbic Acid (VITAMIN C PO) Take 1 tablet by mouth in the  morning.     aspirin  EC 81 MG tablet Take 81 mg by mouth in the morning.     atorvastatin  (LIPITOR) 10 MG tablet TAKE 1 TAKE BY MOUTH EVERY 3 DAYS 90 tablet 1   B Complex-C (B-COMPLEX WITH VITAMIN C) tablet Take 1 tablet by mouth in the morning.     budesonide-formoterol (SYMBICORT) 80-4.5 MCG/ACT inhaler 2 puffs Inhalation Q4H as needed up to 4x a day; Duration: 30 days (Patient taking differently: Inhale 2 puffs into the lungs as needed.)     cetirizine  (ZYRTEC ) 10 MG tablet Take 1 tablet (10 mg total) by mouth daily. (Patient taking differently: Take 10 mg by mouth daily as needed for allergies.) 30 tablet 11   Cholecalciferol  (VITAMIN D3 PO) Take 1 tablet by mouth in the morning.     clobetasol  ointment (TEMOVATE ) 0.05 % Apply 1 Application topically 2 (two) times daily as needed (irritation/discomfort).     Coenzyme Q10 (COQ10 PO) Take 1 capsule by mouth 3 days.     docusate sodium  (COLACE) 100 MG capsule Take 100 mg by mouth at bedtime as needed (constipation.).     EPIPEN  2-PAK 0.3 MG/0.3ML SOAJ injection Inject 0.3 mg into the muscle as needed. Reported on 06/28/2016     esomeprazole  (NEXIUM ) 40 MG capsule Take 1 capsule (40 mg total) by mouth 2 (two) times daily before a meal. 180 capsule 1   fluticasone  (FLONASE ) 50 MCG/ACT nasal spray Place 2 sprays into both nostrils daily as needed for allergies.     Multiple Vitamins-Minerals (CITRACAL +D3 PO) Take 1 tablet by mouth in the morning.     Omega 3-6-9 Fatty Acids (TRIPLE OMEGA-3-6-9 PO) Take 1 capsule by mouth every evening.     PROAIR HFA 108 (90 Base) MCG/ACT inhaler Inhale 1-2 puffs into the lungs every 6 (six) hours as needed for shortness of breath or wheezing.  0   Probiotic TABS Take 1 tablet by mouth in the morning.     TURMERIC PO Take 1,000 mg by mouth in the morning.     valsartan  (DIOVAN ) 80 MG tablet TAKE 1 TABLET BY MOUTH DAILY 90 tablet 1   XDEMVY 0.25 % SOLN      Multiple Vitamin (MULTIVITAMIN WITH MINERALS) TABS tablet  Take 1 tablet by mouth in the morning. (Patient not taking: Reported on 10/20/2024)     Multiple Vitamins-Minerals (OCUVITE PO) Take 1 tablet by mouth every evening. (Patient not taking: Reported on 10/20/2024)     nitrofurantoin, macrocrystal-monohydrate, (MACROBID) 100 MG capsule Take 1 capsule (100 mg total) by mouth 2 (two) times daily. (Patient not taking: Reported on 10/20/2024) 14 capsule 0   No current facility-administered medications for this visit.  ALLERGIES: Evista  [raloxifene ], Amoxicillin -pot clavulanate, Doxycycline , Fosamax [alendronate], Shellfish allergy, and Singulair  [montelukast ]  Family History  Problem Relation Age of Onset   Hypertension Mother    Diabetes Father    Hypertension Father    Heart disease Father    Arthritis Father    Diabetes Sister    Hypertension Sister    Schizophrenia Sister    Asthma Sister    Heart disease Sister    Thyroid  disease Sister    Heart disease Sister    Throat cancer Paternal Uncle        smoker   Lung cancer Cousin        smoker   Colon cancer Neg Hx    Pancreatic cancer Neg Hx    Stomach cancer Neg Hx    Liver disease Neg Hx    Colon polyps Neg Hx    Esophageal cancer Neg Hx    Rectal cancer Neg Hx     Review of Systems  All other systems reviewed and are negative.   PHYSICAL EXAM:  BP 116/74 (BP Location: Left Arm, Patient Position: Sitting)   Pulse 63   Ht 5' 4.5 (1.638 m)   Wt 140 lb (63.5 kg)   LMP 12/16/1993 (Approximate)   SpO2 97%   BMI 23.66 kg/m     General appearance: alert, cooperative and appears stated age Head: normocephalic, without obvious abnormality, atraumatic Neck: no adenopathy, supple, symmetrical, trachea midline and thyroid  normal to inspection and palpation Lungs: clear to auscultation bilaterally Breasts: normal appearance, no masses or tenderness, No nipple retraction or dimpling, No nipple discharge or bleeding, No axillary adenopathy Heart: regular rate and rhythm Abdomen:  soft, non-tender; no masses, no organomegaly Extremities: extremities normal, atraumatic, no cyanosis or edema Skin: skin color, texture, turgor normal. No rashes or lesions Lymph nodes: cervical, supraclavicular, and axillary nodes normal. Neurologic: grossly normal  Pelvic: External genitalia:  pale coloration of clitoris, labia minora.                No abnormal inguinal nodes palpated.              Urethra:  urethral prolapse.               Bartholins and Skenes: normal                 Vagina: normal appearing vagina with normal color and discharge, no lesions.  Good support.               Cervix: absent              Pap taken: no Bimanual Exam:  Uterus:  absent              Adnexa: no mass, fullness, tenderness              Rectal exam: yes.  Confirms.              Anus:  normal sphincter tone, no lesions  Chaperone was present for exam:  Joy J, CMA  ASSESSMENT: Encounter for breast and pelvic exam.  Status post total vaginal hysterectomy.  Ovaries remain. Status post sacrocolpopexy, Halban culdoplasty and Burch.  Status post rectocele repair with biologic graft.  Cystocele. Mild.  Hx UTI.  Urethral prolapse.  Osteoporosis.  Hx fracture.  Has declined tx.  Intolerant of Evista .   Lichen sclerosus.  Controlled with Clobetasol . Encounter for medication monitoring.  Hx GERD.  PLAN: Mammogram screening discussed. Self breast awareness reviewed. Pap  and HRV collected:  no.  Not indicated. Guidelines for Calcium , Vitamin D , regular exercise program including cardiovascular and weight bearing exercise. Medication refills:  clobetasol  ointment.  Start vaginal estradiol cream pea size amount to the urethra twice weekly for UTI prevention. Bone density declined.  Declines Prolia, Reclast, and Evenity.  Follow up:  yearly and prn.   Additional counseling given.  yes. 30 min  total time was spent for this patient encounter, including preparation, face-to-face counseling with the  patient, coordination of care, and documentation of the encounter in addition to doing the breast and pelvic exam.

## 2024-10-20 ENCOUNTER — Ambulatory Visit: Admitting: Obstetrics and Gynecology

## 2024-10-20 ENCOUNTER — Encounter: Payer: Self-pay | Admitting: Obstetrics and Gynecology

## 2024-10-20 VITALS — BP 116/74 | HR 63 | Ht 64.5 in | Wt 140.0 lb

## 2024-10-20 DIAGNOSIS — N368 Other specified disorders of urethra: Secondary | ICD-10-CM

## 2024-10-20 DIAGNOSIS — Z01419 Encounter for gynecological examination (general) (routine) without abnormal findings: Secondary | ICD-10-CM

## 2024-10-20 DIAGNOSIS — M810 Age-related osteoporosis without current pathological fracture: Secondary | ICD-10-CM | POA: Diagnosis not present

## 2024-10-20 DIAGNOSIS — N904 Leukoplakia of vulva: Secondary | ICD-10-CM | POA: Diagnosis not present

## 2024-10-20 DIAGNOSIS — Z5181 Encounter for therapeutic drug level monitoring: Secondary | ICD-10-CM | POA: Diagnosis not present

## 2024-10-20 MED ORDER — CLOBETASOL PROPIONATE 0.05 % EX OINT
1.0000 | TOPICAL_OINTMENT | Freq: Two times a day (BID) | CUTANEOUS | 1 refills | Status: AC | PRN
Start: 2024-10-20 — End: ?

## 2024-10-20 MED ORDER — ESTRADIOL 0.01 % VA CREA: TOPICAL_CREAM | VAGINAL | 1 refills | Status: AC

## 2024-11-04 ENCOUNTER — Other Ambulatory Visit: Payer: Self-pay | Admitting: Family Medicine

## 2024-11-04 DIAGNOSIS — E785 Hyperlipidemia, unspecified: Secondary | ICD-10-CM

## 2024-11-04 MED ORDER — ATORVASTATIN CALCIUM 10 MG PO TABS: ORAL_TABLET | ORAL | 1 refills | Status: AC

## 2024-11-04 NOTE — Telephone Encounter (Signed)
 Copied from CRM [id number]. Topic: Clinical - Prescription Issue >> Nov 04, 2024 12:56 PM [facility] OR[facility] wrote: Reason for CRM: Sarah [name] with [facility] called to follow up on rx request for atorvastatin  (LIPITOR) 10 MG tablet that was faxed on 11/15 and 11/17. Please advise when this will be sent.     Please call back [phone]

## 2025-10-25 ENCOUNTER — Ambulatory Visit: Admitting: Obstetrics and Gynecology
